# Patient Record
Sex: Female | Born: 1970 | Race: White | Hispanic: No | Marital: Single | Smoking: Never smoker
Health system: Southern US, Community
[De-identification: ages and names within clinical notes are randomized; demographics above are authoritative.]

## PROBLEM LIST (undated history)

## (undated) DIAGNOSIS — I341 Nonrheumatic mitral (valve) prolapse: Secondary | ICD-10-CM

## (undated) DIAGNOSIS — F329 Major depressive disorder, single episode, unspecified: Secondary | ICD-10-CM

## (undated) DIAGNOSIS — F32A Depression, unspecified: Secondary | ICD-10-CM

## (undated) HISTORY — DX: Nonrheumatic mitral (valve) prolapse: I34.1

## (undated) HISTORY — DX: Major depressive disorder, single episode, unspecified: F32.9

## (undated) HISTORY — DX: Depression, unspecified: F32.A

---

## 1996-06-11 HISTORY — PX: AUGMENTATION MAMMAPLASTY: SUR837

## 2001-01-28 ENCOUNTER — Encounter: Admission: RE | Admit: 2001-01-28 | Discharge: 2001-01-28 | Payer: Self-pay | Admitting: *Deleted

## 2001-01-28 ENCOUNTER — Encounter: Payer: Self-pay | Admitting: *Deleted

## 2002-04-13 ENCOUNTER — Other Ambulatory Visit: Admission: RE | Admit: 2002-04-13 | Discharge: 2002-04-13 | Payer: Self-pay | Admitting: Family Medicine

## 2007-11-25 ENCOUNTER — Observation Stay (HOSPITAL_COMMUNITY): Admission: AD | Admit: 2007-11-25 | Discharge: 2007-11-25 | Payer: Self-pay | Admitting: Obstetrics and Gynecology

## 2007-11-26 ENCOUNTER — Inpatient Hospital Stay (HOSPITAL_COMMUNITY): Admission: AD | Admit: 2007-11-26 | Discharge: 2007-11-28 | Payer: Self-pay | Admitting: Obstetrics & Gynecology

## 2010-10-24 NOTE — Op Note (Signed)
NAMESAMEKA, [name]                 ACCOUNT NO.:  [id number]   MEDICAL RECORD NO.:  [id number]          PATIENT TYPE:  INP   LOCATION:  9138                          FACILITY:  [facility]   PHYSICIAN:  Gerrit Friends. Aldona Bar, M.D.   DATE OF BIRTH:  1970/11/22   DATE OF PROCEDURE:  11/26/2007  DATE OF DISCHARGE:                               OPERATIVE REPORT   PREOPERATIVE DIAGNOSES:  Term pregnancy, known breech, ruptured  membranes, labor.   POSTOPERATIVE DIAGNOSES:  Term pregnancy, known breech, ruptured  membranes, labor plus delivery of a 7-pound-6-ounce female infant, Apgars  9 and 9 - frank breech presentation.   PROCEDURE:  Primary low-transverse cesarean section.   SURGEON:  Gerrit Friends. Aldona Bar, MD   ANESTHESIA:  Subarachnoid block, Angelica Pou, MD.   HISTORY:  This 40 year old, gravida 2, para 0, presented with ruptured  membranes for approximately 6 hours and early labor with a known breech  presentation.  She was positive for group B strep and received 2 g of  ampicillin IV prior to going to the operating room.  She was taken to  the operating room for primary low-transverse cesarean section for  delivery because of a known frank breech presentation, which was  confirmed on admission.   PROCEDURE:  The patient was taken to the operating where after  satisfactory induction of a spinal anesthetic by Dr. Malen Gauze, she was  prepped and draped in the usual fashion having been placed in supine  position, slightly tilted to the left.  A Foley catheter was placed just  prior to prep.   After the patient was appropriately draped, good anesthetic levels were  documented and procedure was begun.  A Pfannenstiel incision was made  with minimal difficulty and dissected down sharply to and through the  fascia in a low-transverse fashion with hemostasis created in each  layer.  Subfascial space was created inferiorly and superiorly.  Muscles  were separated in midline.  Peritoneum was identified  and entered  appropriate with care taken to avoid the bowel superiorly and the  bladder inferiorly.  At this time, the vesicouterine peritoneum was  identified, incised in a low-transverse fashion, pushed off the lower  uterine segment, and then a sharp incision into the lower segment was  made with Metzenbaum scissors and extended laterally.  From a frank  breech presentation, a viable female infant was delivered without  difficulty.  Infant cried spontaneously once and after cord was clamped  and cut, the infant was passed off the awaiting team.  Apgars were noted  to be 9 and 9 and subsequent weight was found to be 7 pounds 6 ounces.   After the placenta was delivered, the uterus was exteriorized and  rendered free of any remaining products of conception.  Good uterine  contractility was afforded with slowly given intravenous Pitocin and  manual stimulation.  Closure of the uterine incision was then carried  out using a single layer of #1 Vicryl in a running locking fashion.  Several figure-of-eight #1 Vicryls were used thereafter for additional  hemostasis.  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  Tubes and ovaries were  inspected and noted to be normal as well.   At this time with the uterine incision well approximated and dry, the  abdomen was lavaged of all free blood and clot.  The uterus was replaced  into the abdominal cavity.  At this point, all counts were noted to be  correct and no foreign bodies were noted to be remained in the abdominal  cavity.  Closure of the abdomen was then carried out in layers.  The  abdominal peritoneum was closed with 0 Vicryl in a running fashion and  muscle was secured with same.  After assured of good fascial hemostasis,  the fascia was reapproximated using 0 Vicryl from angle to midline  bilaterally.  Subcutaneous tissues were found to be hemostatic and  staples were then used to  close the skin.  Sterile pressure was applied.  At this point, the patient was taken to recovery room in satisfactory  condition, having tolerated the procedure tolerated well.  Estimated  blood loss 500 mL.  All counts were correct x2.  After conclusion of  procedure, both mother and baby were doing well in the respective  recovery areas.      Gerrit Friends. Aldona Bar, M.D.  Electronically Signed     RMW/MEDQ  D:  11/26/2007  T:  11/26/2007  Job:  [id number]

## 2010-10-27 NOTE — Discharge Summary (Signed)
NAMECIARA, Kara [name]                 ACCOUNT NO.:  [id number]   MEDICAL RECORD NO.:  [id number]          PATIENT TYPE:  INP   LOCATION:  9138                          FACILITY:  [facility]   PHYSICIAN:  Ilda Mori, M.D.   DATE OF BIRTH:  1970-07-26   DATE OF ADMISSION:  11/26/2007  DATE OF DISCHARGE:  11/28/2007                               DISCHARGE SUMMARY   FINAL DIAGNOSES:  Intrauterine gestation at term, known breech  presentation, rupture of membranes, active labor.   PROCEDURE:  Primary low-transverse cesarean section.   SURGEON:  Gerrit Friends. Aldona Bar, M.D.   COMPLICATIONS:  None.   This 40 year old G2 P 0-0-1-0 presents at term with rupture of membranes  and in early active labor.  The patient did have a positive group B  strep culture obtained in our office.  She was started on ampicillin IV  prior to going to the operating room.  She had a known frank breech  presentation.  Otherwise, the patient's antepartum course had been  uncomplicated.  The patient was taken to the operating room on November 26, 2007 by Dr. Annamaria Helling, where a primary low-transverse cesarean section  was performed with the delivery of a 7-pound 6-ounce female infant with  Apgars of 9 and 9. The baby was delivered in a frank breech  presentation.  The delivery went without complications.  The patient's  postoperative course was benign without any fevers.  She declined  circumcision of her little boy.  The patient was felt ready for  discharge on postoperative day #2.  We sent home on a regular diet, told  to decrease activities, told to continue her vitamins.  She was given  Percocet 1-2 every 4 hours as needed for pain, told she could use  ibuprofen up to 600 mg every 6 hours as needed for pain, and was to  follow up in our office in 6 weeks.   LABS ON DISCHARGE:  The patient had a hemoglobin of 9.1, white blood  cell count of 10.3, and platelets of 151,000.      Leilani Able, P.A.-C.      Ilda Mori, M.D.  Electronically Signed    MB/MEDQ  D:  12/31/2007  T:  01/01/2008  Job:  [id number]

## 2011-03-08 LAB — CBC
HCT: 26.8 — ABNORMAL LOW
HCT: 34.2 — ABNORMAL LOW
Hemoglobin: 11.8 — ABNORMAL LOW
Hemoglobin: 9.1 — ABNORMAL LOW
MCHC: 34
MCHC: 34.5
MCV: 96.3
MCV: 98.4
Platelets: 151
Platelets: 183
RBC: 2.72 — ABNORMAL LOW
RBC: 3.55 — ABNORMAL LOW
RDW: 13.2
RDW: 13.7
WBC: 10.3
WBC: 12.8 — ABNORMAL HIGH

## 2011-03-08 LAB — RPR: RPR Ser Ql: NONREACTIVE

## 2011-04-09 ENCOUNTER — Ambulatory Visit (INDEPENDENT_AMBULATORY_CARE_PROVIDER_SITE_OTHER): Payer: Self-pay | Admitting: Family Medicine

## 2011-04-09 ENCOUNTER — Encounter: Payer: Self-pay | Admitting: Family Medicine

## 2011-04-09 VITALS — BP 122/80 | HR 68 | Temp 99.6°F | Wt 113.0 lb

## 2011-04-09 DIAGNOSIS — F329 Major depressive disorder, single episode, unspecified: Secondary | ICD-10-CM

## 2011-04-09 MED ORDER — SERTRALINE HCL 50 MG PO TABS: 50.0000 mg | ORAL_TABLET | Freq: Every day | ORAL | Status: DC

## 2011-04-09 NOTE — Patient Instructions (Signed)
Great to meet you. Please let me know if you want [place] to set up your mammogram in the future.   Call me in 3-4 weeks with an update of your symptoms.

## 2011-04-09 NOTE — Progress Notes (Signed)
  Subjective:    Patient ID: Kara [name], female    DOB: 04/28/1971, 40 y.o.   MRN: [id number]  HPI  40 yo new to practice here to discuss depression.  Pt does not have health insurance, would like for this to be acute visit only.  Depression- started Zoloft in 2009.  Felt it helped with her symptoms tremendously.  Had a previously adverse reaction to Wellbutrin- made her more anxious.  Weaned herself off in May because she and her husband were trying to conceive. They have decided not to have more children at this time and she would like to restart an antidepressant. Has felt more anxious and tearful lately. 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  No SI or HI.  Patient Active Problem List  Diagnoses  . Depression   Past Medical History  Diagnosis Date  . Depression   . Mitral valve prolapse    No past surgical history on file. History  Substance Use Topics  . Smoking status: Never Smoker   . Smokeless tobacco: Not on file  . Alcohol Use: Not on file   Family History  Problem Relation Age of Onset  . Cancer Maternal Aunt 55    breast   Allergies  Allergen Reactions  . Sulfa Antibiotics Shortness Of Breath   No current outpatient prescriptions on file prior to visit.   The PMH, PSH, Social History, Family History, Medications, and allergies have been reviewed in [facility], and have been updated if relevant.   Review of Systems See HPI    Objective:   Physical Exam BP 122/80  Pulse 68  Temp(Src) 99.6 F (37.6 C) (Oral)  Wt 113 lb (51.256 kg)  General:  Well-developed,well-nourished,in no acute distress; alert,appropriate and cooperative throughout examination Head:  normocephalic and atraumatic.   Eyes:  vision grossly intact, pupils equal, pupils round, and pupils reactive to light.   Ears:  R ear normal and L ear normal.   Nose:  no external deformity.   Mouth:  good dentition.   Neurologic:  alert & oriented X3  and gait normal.   Skin:  Intact without suspicious lesions or rashes Psych:  Cognition and judgment appear intact. Alert and cooperative with normal attention span and concentration. No apparent delusions, illusions, hallucinations     Assessment & Plan:   1. Depression  sertraline (ZOLOFT) 50 MG tablet   Deteriorated. >35 min spent with face to face with patient, >50% counseling and/or coordinating care. Will restart Zoloft at 50 mg daily (she could not remember dose). She will cal lin 3-4 weeks with an update of her symptoms.

## 2011-05-07 ENCOUNTER — Telehealth: Payer: Self-pay | Admitting: *Deleted

## 2011-05-07 NOTE — Telephone Encounter (Signed)
Patient was told to call you back in 3-4 weeks to let you know how the anti-depressant was working for her. Patient states that the medication is working fine and want to continue on it. Patient states that she has refills and will have pharmacy notify you when she needs more.

## 2011-05-07 NOTE — Telephone Encounter (Signed)
That's great! Thank you.

## 2012-03-27 ENCOUNTER — Other Ambulatory Visit: Payer: Self-pay | Admitting: Family Medicine

## 2012-04-07 ENCOUNTER — Ambulatory Visit (INDEPENDENT_AMBULATORY_CARE_PROVIDER_SITE_OTHER): Payer: Self-pay

## 2012-04-07 DIAGNOSIS — Z23 Encounter for immunization: Secondary | ICD-10-CM

## 2012-05-06 ENCOUNTER — Other Ambulatory Visit: Payer: Self-pay | Admitting: Family Medicine

## 2012-05-30 ENCOUNTER — Encounter: Payer: Self-pay | Admitting: Family Medicine

## 2012-05-30 ENCOUNTER — Ambulatory Visit (INDEPENDENT_AMBULATORY_CARE_PROVIDER_SITE_OTHER): Payer: BC Managed Care – PPO | Admitting: Family Medicine

## 2012-05-30 VITALS — BP 130/82 | HR 80 | Temp 99.0°F | Wt 117.0 lb

## 2012-05-30 DIAGNOSIS — F329 Major depressive disorder, single episode, unspecified: Secondary | ICD-10-CM

## 2012-05-30 DIAGNOSIS — Z1231 Encounter for screening mammogram for malignant neoplasm of breast: Secondary | ICD-10-CM

## 2012-05-30 MED ORDER — SERTRALINE HCL 50 MG PO TABS: ORAL_TABLET | ORAL | Status: DC

## 2012-05-30 NOTE — Patient Instructions (Addendum)
Good to see you. Have a wonderful Christmas!  Stop by to see Shirlee Limerick on your way out to set up your way.

## 2012-05-30 NOTE — Progress Notes (Signed)
  Subjective:    Patient ID: Kara [name], female    DOB: 09-Oct-1970, 41 y.o.   MRN: [id number]  HPI  41 yo here to follow up depression.   Depression- restarted Zoloft 50 mg in 2012.  Felt it helped with her symptoms tremendously.  Had a previously adverse reaction to Wellbutrin- made her more anxious.  Feels great with Zoloft.  Denies any symptoms of anxiety or depression.  Would like it refilled today.   No SI or HI.  Scheduled for CPX in two months.  She would like to go ahead and schedule mammogram.  Patient Active Problem List  Diagnosis  . Depression   Past Medical History  Diagnosis Date  . Depression   . Mitral valve prolapse    No past surgical history on file. History  Substance Use Topics  . Smoking status: Never Smoker   . Smokeless tobacco: Not on file  . Alcohol Use: Not on file   Family History  Problem Relation Age of Onset  . Cancer Maternal Aunt 55    breast   Allergies  Allergen Reactions  . Sulfa Antibiotics Shortness Of Breath   Current Outpatient Prescriptions on File Prior to Visit  Medication Sig Dispense Refill  . cetirizine (ZYRTEC) 10 MG tablet Take by mouth daily as needed       . diphenhydrAMINE (BENADRYL ALLERGY) 25 MG tablet Take by mouth as directed as needed.       . Multiple Vitamin (MULTIVITAMIN) tablet Take 1 tablet by mouth daily.        . sertraline (ZOLOFT) 50 MG tablet TAKE ONE TABLET BY MOUTH ONE TIME DAILY  30 tablet  0   The PMH, PSH, Social History, Family History, Medications, and allergies have been reviewed in [facility], and have been updated if relevant.   Review of Systems See HPI    Objective:   Physical Exam There were no vitals taken for this visit.  General:  Well-developed,well-nourished,in no acute distress; alert,appropriate and cooperative throughout examination Head:  normocephalic and atraumatic.   Eyes:  vision grossly intact, pupils equal, pupils round, and pupils reactive to light.   Ears:  R ear  normal and L ear normal.   Nose:  no external deformity.   Mouth:  good dentition.   Neurologic:  alert & oriented X3 and gait normal.   Skin:  Intact without suspicious lesions or rashes Psych:  Cognition and judgment appear intact. Alert and cooperative with normal attention span and concentration. No apparent delusions, illusions, hallucinations     Assessment & Plan:   1. Depression    >15 min spent with face to face with patient, >50% counseling and/or coordinating care. Stable on current dose of Zoloft. Rx refilled.

## 2012-06-30 ENCOUNTER — Ambulatory Visit
Admission: RE | Admit: 2012-06-30 | Discharge: 2012-06-30 | Disposition: A | Discharge status: Home / Self Care | Payer: BC Managed Care – PPO | Source: Ambulatory Visit | Attending: Family Medicine | Admitting: Family Medicine

## 2012-06-30 ENCOUNTER — Other Ambulatory Visit: Payer: Self-pay | Admitting: Family Medicine

## 2012-06-30 DIAGNOSIS — Z1231 Encounter for screening mammogram for malignant neoplasm of breast: Secondary | ICD-10-CM

## 2012-07-02 ENCOUNTER — Other Ambulatory Visit: Payer: Self-pay | Admitting: Family Medicine

## 2012-07-02 DIAGNOSIS — Z0000 Encounter for general adult medical examination without abnormal findings: Secondary | ICD-10-CM

## 2012-07-02 DIAGNOSIS — Z136 Encounter for screening for cardiovascular disorders: Secondary | ICD-10-CM

## 2012-07-07 ENCOUNTER — Telehealth: Payer: Self-pay

## 2012-07-07 NOTE — Telephone Encounter (Signed)
Pt has CPX on 07/15/12 and lab appt 07/08/12; pt is not sure if will be on menstrual period when comes for CPX and wants to know if she should cancel appts. Advised to keep appts and if mild flow may be able to do pap or reschedule for brief appt if bleeding heavy. Pt will keep appts.

## 2012-07-08 ENCOUNTER — Other Ambulatory Visit (INDEPENDENT_AMBULATORY_CARE_PROVIDER_SITE_OTHER): Payer: BC Managed Care – PPO

## 2012-07-08 DIAGNOSIS — Z136 Encounter for screening for cardiovascular disorders: Secondary | ICD-10-CM

## 2012-07-08 DIAGNOSIS — Z0000 Encounter for general adult medical examination without abnormal findings: Secondary | ICD-10-CM

## 2012-07-08 LAB — CBC WITH DIFFERENTIAL/PLATELET
Basophils Absolute: 0 10*3/uL (ref 0.0–0.1)
Basophils Relative: 0.4 % (ref 0.0–3.0)
Eosinophils Absolute: 0.2 10*3/uL (ref 0.0–0.7)
Hemoglobin: 13.3 g/dL (ref 12.0–15.0)
Lymphocytes Relative: 29.2 % (ref 12.0–46.0)
MCHC: 33.9 g/dL (ref 30.0–36.0)
Monocytes Relative: 3.6 % (ref 3.0–12.0)
Neutro Abs: 4.9 10*3/uL (ref 1.4–7.7)
Neutrophils Relative %: 63.6 % (ref 43.0–77.0)
RBC: 4.31 Mil/uL (ref 3.87–5.11)
WBC: 7.7 10*3/uL (ref 4.5–10.5)

## 2012-07-08 LAB — COMPREHENSIVE METABOLIC PANEL
ALT: 14 U/L (ref 0–35)
AST: 18 U/L (ref 0–37)
Albumin: 4.5 g/dL (ref 3.5–5.2)
Alkaline Phosphatase: 43 U/L (ref 39–117)
Glucose, Bld: 103 mg/dL — ABNORMAL HIGH (ref 70–99)
Potassium: 4.1 mEq/L (ref 3.5–5.1)
Sodium: 138 mEq/L (ref 135–145)
Total Bilirubin: 1 mg/dL (ref 0.3–1.2)
Total Protein: 7.6 g/dL (ref 6.0–8.3)

## 2012-07-08 LAB — LIPID PANEL
Cholesterol: 180 mg/dL (ref 0–200)
LDL Cholesterol: 118 mg/dL — ABNORMAL HIGH (ref 0–99)
Total CHOL/HDL Ratio: 3
VLDL: 9.6 mg/dL (ref 0.0–40.0)

## 2012-07-15 ENCOUNTER — Ambulatory Visit (INDEPENDENT_AMBULATORY_CARE_PROVIDER_SITE_OTHER): Payer: BC Managed Care – PPO | Admitting: Family Medicine

## 2012-07-15 ENCOUNTER — Encounter: Payer: Self-pay | Admitting: Family Medicine

## 2012-07-15 ENCOUNTER — Other Ambulatory Visit (HOSPITAL_COMMUNITY)
Admission: RE | Admit: 2012-07-15 | Discharge: 2012-07-15 | Disposition: A | Discharge status: Home / Self Care | Payer: BC Managed Care – PPO | Source: Ambulatory Visit | Attending: Family Medicine | Admitting: Family Medicine

## 2012-07-15 VITALS — BP 110/64 | HR 76 | Temp 98.7°F | Ht 63.0 in | Wt 114.0 lb

## 2012-07-15 DIAGNOSIS — F329 Major depressive disorder, single episode, unspecified: Secondary | ICD-10-CM

## 2012-07-15 DIAGNOSIS — Z01419 Encounter for gynecological examination (general) (routine) without abnormal findings: Secondary | ICD-10-CM | POA: Insufficient documentation

## 2012-07-15 DIAGNOSIS — Z1151 Encounter for screening for human papillomavirus (HPV): Secondary | ICD-10-CM | POA: Insufficient documentation

## 2012-07-15 DIAGNOSIS — Z23 Encounter for immunization: Secondary | ICD-10-CM

## 2012-07-15 DIAGNOSIS — Z0000 Encounter for general adult medical examination without abnormal findings: Secondary | ICD-10-CM | POA: Insufficient documentation

## 2012-07-15 NOTE — Addendum Note (Signed)
Addended by: Eliezer Bottom on: 07/15/2012 09:48 AM   Modules accepted: Orders

## 2012-07-15 NOTE — Patient Instructions (Addendum)
It was so wonderful to see you. Please take a prenatal vitamin daily.  Call me in a few months if you want me to refer you to a reproductive endocrinologist.

## 2012-07-15 NOTE — Progress Notes (Signed)
Subjective:    Patient ID: Kara [name], female    DOB: 1971/01/30, 42 y.o.   MRN: [id number]  HPI  42 yo very pleasant G1P1 here for CPX.   Depression- restarted Zoloft 50 mg in 2012.  Felt it helped with her symptoms tremendously.  Had a previously adverse reaction to Wellbutrin- made her more anxious.  Feels great with Zoloft.  Denies any symptoms of anxiety or depression.    No SI or HI.  Normal mammogram last month.  Due for Pap smear.  No h/o abnormal pap smears.  Sexually active with husband only.  They are considering having another child.  Lab Results  Component Value Date   CHOL 180 07/08/2012   HDL 52.80 07/08/2012   LDLCALC 118* 07/08/2012   TRIG 48.0 07/08/2012   CHOLHDL 3 07/08/2012     Patient Active Problem List  Diagnosis  . Depression  . Routine general medical examination at a health care facility   Past Medical History  Diagnosis Date  . Depression   . Mitral valve prolapse    No past surgical history on file. History  Substance Use Topics  . Smoking status: Never Smoker   . Smokeless tobacco: Not on file  . Alcohol Use: Not on file   Family History  Problem Relation Age of Onset  . Cancer Maternal Aunt 55    breast   Allergies  Allergen Reactions  . Sulfa Antibiotics Shortness Of Breath   Current Outpatient Prescriptions on File Prior to Visit  Medication Sig Dispense Refill  . cetirizine (ZYRTEC) 10 MG tablet Take by mouth daily as needed       . diphenhydrAMINE (BENADRYL ALLERGY) 25 MG tablet Take by mouth as directed as needed.       . Multiple Vitamin (MULTIVITAMIN) tablet Take 1 tablet by mouth daily.        . sertraline (ZOLOFT) 50 MG tablet TAKE ONE TABLET BY MOUTH ONE TIME DAILY  90 tablet  6   The PMH, PSH, Social History, Family History, Medications, and allergies have been reviewed in [facility], and have been updated if relevant.   Review of Systems See HPI Patient reports no  vision/ hearing changes,anorexia, weight change,  fever ,adenopathy, persistant / recurrent hoarseness, swallowing issues, chest pain, edema,persistant / recurrent cough, hemoptysis, dyspnea(rest, exertional, paroxysmal nocturnal), gastrointestinal  bleeding (melena, rectal bleeding), abdominal pain, excessive heart burn, GU symptoms(dysuria, hematuria, pyuria, voiding/incontinence  Issues) syncope, focal weakness, severe memory loss, concerning skin lesions, depression, anxiety, abnormal bruising/bleeding, major joint swelling, breast masses or abnormal vaginal bleeding.       Objective:   Physical Exam BP 110/64  Pulse 76  Temp 98.7 F (37.1 C)  Ht 5\' 3"  (1.6 m)  Wt 114 lb (51.71 kg)  BMI 20.19 kg/m2   General:  Well-developed,well-nourished,in no acute distress; alert,appropriate and cooperative throughout examination Head:  normocephalic and atraumatic.   Eyes:  vision grossly intact, pupils equal, pupils round, and pupils reactive to light.   Ears:  R ear normal and L ear normal.   Nose:  no external deformity.   Mouth:  good dentition.   Neck:  No deformities, masses, or tenderness noted. Breasts:  +implants bilaterally, No mass, nodules, thickening, tenderness, bulging, retraction, inflamation, nipple discharge or skin changes noted.   Lungs:  Normal respiratory effort, chest expands symmetrically. Lungs are clear to auscultation, no crackles or wheezes. Heart:  Normal rate and regular rhythm. S1 and S2 normal without gallop, murmur, click, rub  or other extra sounds. Abdomen:  Bowel sounds positive,abdomen soft and non-tender without masses, organomegaly or hernias noted. Rectal:  no external abnormalities.   Genitalia:  Pelvic Exam:        External: normal female genitalia without lesions or masses        Vagina: normal without lesions or masses        Cervix: normal without lesions or masses        Adnexa: normal bimanual exam without masses or fullness        Uterus: normal by palpation        Pap smear: performed Msk:  No  deformity or scoliosis noted of thoracic or lumbar spine.   Extremities:  No clubbing, cyanosis, edema, or deformity noted with normal full range of motion of all joints.   Neurologic:  alert & oriented X3 and gait normal.   Skin:  Intact without suspicious lesions or rashes Cervical Nodes:  No lymphadenopathy noted Axillary Nodes:  No palpable lymphadenopathy Psych:  Cognition and judgment appear intact. Alert and cooperative with normal attention span and concentration. No apparent delusions, illusions, hallucinations      Assessment & Plan:   1. Routine general medical examination at a health care facility  Reviewed preventive care protocols, scheduled due services, and updated immunizations Discussed nutrition, exercise, diet, and healthy lifestyle.  Pap smear  2. Depression  Stable on current dose of Zoloft.

## 2012-07-18 ENCOUNTER — Encounter: Payer: Self-pay | Admitting: *Deleted

## 2012-07-18 ENCOUNTER — Encounter: Payer: Self-pay | Admitting: Family Medicine

## 2012-10-02 ENCOUNTER — Telehealth: Payer: Self-pay

## 2012-10-02 NOTE — Telephone Encounter (Signed)
Pt left v/m;at last office visit pt had discussed with Dr Dayton Martes about possible fertility clinic; pt wants to know the name of the fertility clinic and the name of the med that would strengthen pt's eggs.Please advise.

## 2012-10-02 NOTE — Telephone Encounter (Signed)
Advised patient

## 2012-10-02 NOTE — Telephone Encounter (Signed)
Dr. April Manson is who I saw and he was great.  He is the head of infertility at [facility] but has a satellite office in [place] (on [address]). His phone number is [phone]  There are a few medications that women take for infertility or to increase ovarian follicles (eggs).  The two most common are Clomid and Femara.

## 2013-04-01 ENCOUNTER — Telehealth: Payer: Self-pay

## 2013-04-01 ENCOUNTER — Other Ambulatory Visit: Payer: Self-pay | Admitting: Internal Medicine

## 2013-04-01 DIAGNOSIS — N979 Female infertility, unspecified: Secondary | ICD-10-CM

## 2013-04-01 NOTE — Telephone Encounter (Signed)
Pt called back to get info from 09/2012 about name of infertility meds and doctor. Pt notified as instructed from 10/02/12 note. Pt voiced understanding and will cb if needs anything else.

## 2013-04-01 NOTE — Telephone Encounter (Signed)
The patient called and is hoping to get referral to a fertility clinic.  Her callback - [phone]

## 2013-04-01 NOTE — Telephone Encounter (Signed)
Referral done

## 2013-04-01 NOTE — Telephone Encounter (Signed)
Per Dr. Elmer Sow last note-patient needs referral to reproductive endocrinologist.

## 2013-06-13 ENCOUNTER — Other Ambulatory Visit: Payer: Self-pay | Admitting: Family Medicine

## 2013-06-29 ENCOUNTER — Other Ambulatory Visit: Payer: Self-pay | Admitting: Family Medicine

## 2013-06-29 DIAGNOSIS — Z0000 Encounter for general adult medical examination without abnormal findings: Secondary | ICD-10-CM

## 2013-06-29 DIAGNOSIS — Z136 Encounter for screening for cardiovascular disorders: Secondary | ICD-10-CM

## 2013-07-09 ENCOUNTER — Other Ambulatory Visit: Payer: Self-pay

## 2013-07-09 ENCOUNTER — Other Ambulatory Visit: Payer: BC Managed Care – PPO

## 2013-07-09 DIAGNOSIS — Z1231 Encounter for screening mammogram for malignant neoplasm of breast: Secondary | ICD-10-CM

## 2013-07-10 ENCOUNTER — Other Ambulatory Visit (INDEPENDENT_AMBULATORY_CARE_PROVIDER_SITE_OTHER): Payer: BC Managed Care – PPO

## 2013-07-10 DIAGNOSIS — Z0000 Encounter for general adult medical examination without abnormal findings: Secondary | ICD-10-CM

## 2013-07-10 DIAGNOSIS — Z136 Encounter for screening for cardiovascular disorders: Secondary | ICD-10-CM

## 2013-07-10 LAB — COMPREHENSIVE METABOLIC PANEL
ALK PHOS: 34 U/L — AB (ref 39–117)
ALT: 13 U/L (ref 0–35)
AST: 17 U/L (ref 0–37)
Albumin: 4.4 g/dL (ref 3.5–5.2)
BUN: 13 mg/dL (ref 6–23)
CO2: 25 mEq/L (ref 19–32)
CREATININE: 0.8 mg/dL (ref 0.4–1.2)
Calcium: 9.1 mg/dL (ref 8.4–10.5)
Chloride: 105 mEq/L (ref 96–112)
GFR: 87.31 mL/min (ref >60.00)
GLUCOSE: 93 mg/dL (ref 70–99)
Potassium: 4.2 mEq/L (ref 3.5–5.1)
Sodium: 136 mEq/L (ref 135–145)
Total Bilirubin: 0.9 mg/dL (ref 0.3–1.2)
Total Protein: 7.4 g/dL (ref 6.0–8.3)

## 2013-07-10 LAB — CBC WITH DIFFERENTIAL/PLATELET
BASOS ABS: 0 10*3/uL (ref 0.0–0.1)
Basophils Relative: 0.5 % (ref 0.0–3.0)
Eosinophils Absolute: 0.2 10*3/uL (ref 0.0–0.7)
Eosinophils Relative: 2.9 % (ref 0.0–5.0)
HEMATOCRIT: 40 % (ref 36.0–46.0)
Hemoglobin: 13.1 g/dL (ref 12.0–15.0)
Lymphocytes Relative: 30 % (ref 12.0–46.0)
Lymphs Abs: 2.1 10*3/uL (ref 0.7–4.0)
MCHC: 32.9 g/dL (ref 30.0–36.0)
MCV: 92.8 fl (ref 78.0–100.0)
MONO ABS: 0.3 10*3/uL (ref 0.1–1.0)
MONOS PCT: 3.9 % (ref 3.0–12.0)
NEUTROS PCT: 62.7 % (ref 43.0–77.0)
Neutro Abs: 4.5 10*3/uL (ref 1.4–7.7)
PLATELETS: 293 10*3/uL (ref 150.0–400.0)
RBC: 4.31 Mil/uL (ref 3.87–5.11)
RDW: 13.1 % (ref 11.5–14.6)
WBC: 7.1 10*3/uL (ref 4.5–10.5)

## 2013-07-10 LAB — LIPID PANEL
CHOL/HDL RATIO: 3
Cholesterol: 207 mg/dL — ABNORMAL HIGH (ref 0–200)
HDL: 62.2 mg/dL (ref >39.00)
TRIGLYCERIDES: 53 mg/dL (ref 0.0–149.0)
VLDL: 10.6 mg/dL (ref 0.0–40.0)

## 2013-07-10 LAB — TSH: TSH: 1.45 u[IU]/mL (ref 0.35–5.50)

## 2013-07-10 LAB — LDL CHOLESTEROL, DIRECT: Direct LDL: 141.4 mg/dL

## 2013-07-16 ENCOUNTER — Encounter: Payer: BC Managed Care – PPO | Admitting: Family Medicine

## 2013-07-20 ENCOUNTER — Ambulatory Visit (INDEPENDENT_AMBULATORY_CARE_PROVIDER_SITE_OTHER): Payer: BC Managed Care – PPO | Admitting: Family Medicine

## 2013-07-20 ENCOUNTER — Encounter: Payer: Self-pay | Admitting: Family Medicine

## 2013-07-20 VITALS — BP 110/70 | HR 82 | Temp 98.0°F | Ht 62.5 in | Wt 114.8 lb

## 2013-07-20 DIAGNOSIS — F329 Major depressive disorder, single episode, unspecified: Secondary | ICD-10-CM

## 2013-07-20 DIAGNOSIS — Z0000 Encounter for general adult medical examination without abnormal findings: Secondary | ICD-10-CM

## 2013-07-20 DIAGNOSIS — F3289 Other specified depressive episodes: Secondary | ICD-10-CM

## 2013-07-20 DIAGNOSIS — F32A Depression, unspecified: Secondary | ICD-10-CM

## 2013-07-20 MED ORDER — SERTRALINE HCL 50 MG PO TABS
ORAL_TABLET | ORAL | Status: DC
Start: 2013-07-20 — End: 2014-08-19

## 2013-07-20 MED ORDER — SERTRALINE HCL 50 MG PO TABS: ORAL_TABLET | ORAL | Status: DC

## 2013-07-20 NOTE — Progress Notes (Signed)
Pre-visit discussion using our clinic review tool. No additional management support is needed unless otherwise documented below in the visit note.  

## 2013-07-20 NOTE — Assessment & Plan Note (Signed)
Reviewed preventive care protocols, scheduled due services, and updated immunizations Discussed nutrition, exercise, diet, and healthy lifestyle.  Discussed USPSTF recommendations of cervical cancer screening.  She is aware that interval of 3 years is recommended.

## 2013-07-20 NOTE — Progress Notes (Signed)
Subjective:    Patient ID: Kara [name], female    DOB: 10/25/1970, 43 y.o.   MRN: [id number]  HPI  43 yo very pleasant G1P1 here for CPX.   Depression- restarted Zoloft 50 mg in 2012.  Felt it helped with her symptoms tremendously.  Had a previously adverse reaction to Wellbutrin- made her more anxious.  Feels great with Zoloft.  Denies any symptoms of anxiety or depression.    No SI or HI.   No h/o abnormal pap smears.  Sexually active with husband only.  They are considering having another child. Last pap smear 07/17/2012- done by me.  Lab Results  Component Value Date   CHOL 207* 07/10/2013   HDL 62.20 07/10/2013   LDLCALC 118* 07/08/2012   LDLDIRECT 141.4 07/10/2013   TRIG 53.0 07/10/2013   CHOLHDL 3 07/10/2013     Patient Active Problem List   Diagnosis Date Noted  . Routine general medical examination at a health care facility 07/15/2012  . Depression 04/09/2011   Past Medical History  Diagnosis Date  . Depression   . Mitral valve prolapse    No past surgical history on file. History  Substance Use Topics  . Smoking status: Never Smoker   . Smokeless tobacco: Not on file  . Alcohol Use: Not on file   Family History  Problem Relation Age of Onset  . Cancer Maternal Aunt 55    breast   Allergies  Allergen Reactions  . Sulfa Antibiotics Shortness Of Breath   Current Outpatient Prescriptions on File Prior to Visit  Medication Sig Dispense Refill  . cetirizine (ZYRTEC) 10 MG tablet Take by mouth daily as needed       . diphenhydrAMINE (BENADRYL ALLERGY) 25 MG tablet Take by mouth as directed as needed.       . Multiple Vitamin (MULTIVITAMIN) tablet Take 1 tablet by mouth daily.         No current facility-administered medications on file prior to visit.   The PMH, PSH, Social History, Family History, Medications, and allergies have been reviewed in [facility], and have been updated if relevant.   Review of Systems See HPI Patient reports no  vision/ hearing  changes,anorexia, weight change, fever ,adenopathy, persistant / recurrent hoarseness, swallowing issues, chest pain, edema,persistant / recurrent cough, hemoptysis, dyspnea(rest, exertional, paroxysmal nocturnal), gastrointestinal  bleeding (melena, rectal bleeding), abdominal pain, excessive heart burn, GU symptoms(dysuria, hematuria, pyuria, voiding/incontinence  Issues) syncope, focal weakness, severe memory loss, concerning skin lesions, depression, anxiety, abnormal bruising/bleeding, major joint swelling, breast masses or abnormal vaginal bleeding.       Objective:   Physical Exam BP 110/70  Pulse 82  Temp(Src) 98 F (36.7 C) (Oral)  Ht 5' 2.5" (1.588 m)  Wt 114 lb 12 oz (52.05 kg)  BMI 20.64 kg/m2  SpO2 98%  LMP 06/26/2013   General:  Well-developed,well-nourished,in no acute distress; alert,appropriate and cooperative throughout examination Head:  normocephalic and atraumatic.   Eyes:  vision grossly intact, pupils equal, pupils round, and pupils reactive to light.   Ears:  R ear normal and L ear normal.   Nose:  no external deformity.   Mouth:  good dentition.   Neck:  No deformities, masses, or tenderness noted. Breasts:  +implants bilaterally, No mass, nodules, thickening, tenderness, bulging, retraction, inflamation, nipple discharge or skin changes noted.   Lungs:  Normal respiratory effort, chest expands symmetrically. Lungs are clear to auscultation, no crackles or wheezes. Heart:  Normal rate and regular rhythm. S1  and S2 normal without gallop, murmur, click, rub or other extra sounds. Abdomen:  Bowel sounds positive,abdomen soft and non-tender without masses, organomegaly or hernias noted. Msk:  No deformity or scoliosis noted of thoracic or lumbar spine.   Extremities:  No clubbing, cyanosis, edema, or deformity noted with normal full range of motion of all joints.   Neurologic:  alert & oriented X3 and gait normal.   Skin:  Intact without suspicious lesions or  rashes Cervical Nodes:  No lymphadenopathy noted Axillary Nodes:  No palpable lymphadenopathy Psych:  Cognition and judgment appear intact. Alert and cooperative with normal attention span and concentration. No apparent delusions, illusions, hallucinations      Assessment & Plan:

## 2013-07-20 NOTE — Assessment & Plan Note (Signed)
Stable on current dose of Zoloft. Rx refilled.

## 2013-08-17 ENCOUNTER — Ambulatory Visit
Admission: RE | Admit: 2013-08-17 | Discharge: 2013-08-17 | Disposition: A | Discharge status: Home / Self Care | Payer: BC Managed Care – PPO | Source: Ambulatory Visit

## 2013-08-17 DIAGNOSIS — Z1231 Encounter for screening mammogram for malignant neoplasm of breast: Secondary | ICD-10-CM

## 2014-07-21 ENCOUNTER — Other Ambulatory Visit: Payer: Self-pay | Admitting: Family Medicine

## 2014-07-21 DIAGNOSIS — Z0000 Encounter for general adult medical examination without abnormal findings: Secondary | ICD-10-CM

## 2014-07-27 ENCOUNTER — Other Ambulatory Visit (INDEPENDENT_AMBULATORY_CARE_PROVIDER_SITE_OTHER): Payer: No Typology Code available for payment source

## 2014-07-27 DIAGNOSIS — Z0000 Encounter for general adult medical examination without abnormal findings: Secondary | ICD-10-CM

## 2014-07-27 LAB — LIPID PANEL
CHOL/HDL RATIO: 3
Cholesterol: 203 mg/dL — ABNORMAL HIGH (ref 0–200)
HDL: 63.8 mg/dL (ref >39.00)
LDL Cholesterol: 130 mg/dL — ABNORMAL HIGH (ref 0–99)
NonHDL: 139.2
Triglycerides: 48 mg/dL (ref 0.0–149.0)
VLDL: 9.6 mg/dL (ref 0.0–40.0)

## 2014-07-27 LAB — COMPREHENSIVE METABOLIC PANEL
ALBUMIN: 4.4 g/dL (ref 3.5–5.2)
ALT: 10 U/L (ref 0–35)
AST: 14 U/L (ref 0–37)
Alkaline Phosphatase: 34 U/L — ABNORMAL LOW (ref 39–117)
BUN: 14 mg/dL (ref 6–23)
CO2: 29 mEq/L (ref 19–32)
CREATININE: 0.8 mg/dL (ref 0.40–1.20)
Calcium: 9.5 mg/dL (ref 8.4–10.5)
Chloride: 104 mEq/L (ref 96–112)
GFR: 83.12 mL/min (ref >60.00)
GLUCOSE: 99 mg/dL (ref 70–99)
POTASSIUM: 4 meq/L (ref 3.5–5.1)
Sodium: 138 mEq/L (ref 135–145)
Total Bilirubin: 0.5 mg/dL (ref 0.2–1.2)
Total Protein: 7.2 g/dL (ref 6.0–8.3)

## 2014-07-27 LAB — CBC WITH DIFFERENTIAL/PLATELET
BASOS ABS: 0 10*3/uL (ref 0.0–0.1)
BASOS PCT: 0.5 % (ref 0.0–3.0)
EOS ABS: 0.1 10*3/uL (ref 0.0–0.7)
Eosinophils Relative: 2 % (ref 0.0–5.0)
HCT: 39.5 % (ref 36.0–46.0)
Hemoglobin: 13.3 g/dL (ref 12.0–15.0)
Lymphocytes Relative: 21.8 % (ref 12.0–46.0)
Lymphs Abs: 1.3 10*3/uL (ref 0.7–4.0)
MCHC: 33.6 g/dL (ref 30.0–36.0)
MCV: 91 fl (ref 78.0–100.0)
MONO ABS: 0.2 10*3/uL (ref 0.1–1.0)
Monocytes Relative: 4.2 % (ref 3.0–12.0)
NEUTROS PCT: 71.5 % (ref 43.0–77.0)
Neutro Abs: 4.2 10*3/uL (ref 1.4–7.7)
PLATELETS: 316 10*3/uL (ref 150.0–400.0)
RBC: 4.34 Mil/uL (ref 3.87–5.11)
RDW: 12.6 % (ref 11.5–15.5)
WBC: 5.9 10*3/uL (ref 4.0–10.5)

## 2014-07-27 LAB — TSH: TSH: 1.57 u[IU]/mL (ref 0.35–4.50)

## 2014-08-03 ENCOUNTER — Ambulatory Visit (INDEPENDENT_AMBULATORY_CARE_PROVIDER_SITE_OTHER): Payer: No Typology Code available for payment source | Admitting: Family Medicine

## 2014-08-03 ENCOUNTER — Encounter: Payer: Self-pay | Admitting: Family Medicine

## 2014-08-03 VITALS — BP 100/70 | HR 90 | Temp 98.4°F | Ht 62.75 in | Wt 107.5 lb

## 2014-08-03 DIAGNOSIS — Z1283 Encounter for screening for malignant neoplasm of skin: Secondary | ICD-10-CM

## 2014-08-03 DIAGNOSIS — Z1239 Encounter for other screening for malignant neoplasm of breast: Secondary | ICD-10-CM

## 2014-08-03 DIAGNOSIS — Z0000 Encounter for general adult medical examination without abnormal findings: Secondary | ICD-10-CM

## 2014-08-03 DIAGNOSIS — F329 Major depressive disorder, single episode, unspecified: Secondary | ICD-10-CM

## 2014-08-03 DIAGNOSIS — F32A Depression, unspecified: Secondary | ICD-10-CM

## 2014-08-03 DIAGNOSIS — Z23 Encounter for immunization: Secondary | ICD-10-CM

## 2014-08-03 NOTE — Progress Notes (Signed)
Pre visit review using our clinic review tool, if applicable. No additional management support is needed unless otherwise documented below in the visit note. 

## 2014-08-03 NOTE — Patient Instructions (Addendum)
Great to see you. Please call to schedule your mammogram after 08/20/14.  Please take two of your zoloft 50 mg tablets daily.  Call me in a few weeks an update.

## 2014-08-03 NOTE — Addendum Note (Signed)
Addended by: Dianne DunARON, Brison Fiumara M on: 08/03/2014 09:07 AM   Modules accepted: Kipp BroodSmartSet

## 2014-08-03 NOTE — Progress Notes (Signed)
Subjective:    Patient ID: Kara [name], female    DOB: 02/14/1971, 44 y.o.   MRN: [id number]  HPI  44 yo very pleasant G1P1 here for CPX.   Depression- deteriorated.  restarted Zoloft 50 mg in 2012.  Felt it helped with her symptoms tremendously.  Had a previously adverse reaction to Wellbutrin- made her more anxious.  Started working again and really enjoys what she is doing but has become more anxious and tearful.  Misses her son, has had to deal with some "personalities at work."   No SI or HI. Appetite decreased.  Sleeping ok. Wt Readings from Last 3 Encounters:  08/03/14 107 lb 8 oz (48.762 kg)  07/20/13 114 lb 12 oz (52.05 kg)  07/15/12 114 lb (51.71 kg)    Tdap 07/15/12   No h/o abnormal pap smears.  Sexually active with husband only.  They are considering having another child. Last pap smear 07/17/2012- done by me.    Lab Results  Component Value Date   CHOL 203* 07/27/2014   HDL 63.80 07/27/2014   LDLCALC 130* 07/27/2014   LDLDIRECT 141.4 07/10/2013   TRIG 48.0 07/27/2014   CHOLHDL 3 07/27/2014    Lab Results  Component Value Date   WBC 5.9 07/27/2014   HGB 13.3 07/27/2014   HCT 39.5 07/27/2014   MCV 91.0 07/27/2014   PLT 316.0 07/27/2014   Lab Results  Component Value Date   TSH 1.57 07/27/2014   Lab Results  Component Value Date   NA 138 07/27/2014   K 4.0 07/27/2014   CL 104 07/27/2014   CO2 29 07/27/2014   Lab Results  Component Value Date   CREATININE 0.80 07/27/2014   Lab Results  Component Value Date   ALT 10 07/27/2014   AST 14 07/27/2014   ALKPHOS 34* 07/27/2014   BILITOT 0.5 07/27/2014     Patient Active Problem List   Diagnosis Date Noted  . Routine general medical examination at a health care facility 07/15/2012  . Depression 04/09/2011   Past Medical History  Diagnosis Date  . Depression   . Mitral valve prolapse    History reviewed. No pertinent past surgical history. History  Substance Use Topics  . Smoking status:  Never Smoker   . Smokeless tobacco: Not on file  . Alcohol Use: Not on file   Family History  Problem Relation Age of Onset  . Cancer Maternal Aunt 55    breast   Allergies  Allergen Reactions  . Sulfa Antibiotics Shortness Of Breath   Current Outpatient Prescriptions on File Prior to Visit  Medication Sig Dispense Refill  . cetirizine (ZYRTEC) 10 MG tablet Take by mouth daily as needed     . diphenhydrAMINE (BENADRYL ALLERGY) 25 MG tablet Take by mouth as directed as needed.     . Multiple Vitamin (MULTIVITAMIN) tablet Take 1 tablet by mouth daily.      . sertraline (ZOLOFT) 50 MG tablet Take 1 tablet by mouth daily. 90 tablet 6   No current facility-administered medications on file prior to visit.   The PMH, PSH, Social History, Family History, Medications, and allergies have been reviewed in [facility], and have been updated if relevant.   Review of Systems See HPI Patient reports no  vision/ hearing changes,anorexia, , fever ,adenopathy, persistant / recurrent hoarseness, swallowing issues, chest pain, edema,persistant / recurrent cough, hemoptysis, dyspnea(rest, exertional, paroxysmal nocturnal), gastrointestinal  bleeding (melena, rectal bleeding), abdominal pain, excessive heart burn, GU symptoms(dysuria, hematuria, pyuria,  voiding/incontinence  Issues) syncope, focal weakness, severe memory loss, concerning skin lesions,  abnormal bruising/bleeding, major joint swelling, breast masses or abnormal vaginal bleeding.       Objective:   Physical Exam BP 100/70 mmHg  Pulse 90  Temp(Src) 98.4 F (36.9 C) (Oral)  Ht 5' 2.75" (1.594 m)  Wt 107 lb 8 oz (48.762 kg)  BMI 19.19 kg/m2  SpO2 98%  LMP 07/19/2014 (Within Days)   General:  Well-developed,well-nourished,in no acute distress; alert,appropriate and cooperative throughout examination Head:  normocephalic and atraumatic.   Eyes:  vision grossly intact, pupils equal, pupils round, and pupils reactive to light.   Ears:  R ear  normal and L ear normal.   Nose:  no external deformity.   Mouth:  good dentition.   Neck:  No deformities, masses, or tenderness noted. Breasts:  +implants bilaterally, No mass, nodules, thickening, tenderness, bulging, retraction, inflamation, nipple discharge or skin changes noted.   Lungs:  Normal respiratory effort, chest expands symmetrically. Lungs are clear to auscultation, no crackles or wheezes. Heart:  Normal rate and regular rhythm. S1 and S2 normal without gallop, murmur, click, rub or other extra sounds. Abdomen:  Bowel sounds positive,abdomen soft and non-tender without masses, organomegaly or hernias noted. Msk:  No deformity or scoliosis noted of thoracic or lumbar spine.   Extremities:  No clubbing, cyanosis, edema, or deformity noted with normal full range of motion of all joints.   Neurologic:  alert & oriented X3 and gait normal.   Skin:  Intact without suspicious lesions or rashes Cervical Nodes:  No lymphadenopathy noted Axillary Nodes:  No palpable lymphadenopathy Psych:  Cognition and judgment appear intact. Alert and cooperative with normal attention span and concentration. No apparent delusions, illusions, hallucinations      Assessment & Plan:

## 2014-08-03 NOTE — Assessment & Plan Note (Signed)
Deteriorated. Discussed tx options. She would like to try a higher dose of zoloft. She will increase her dose at home first- taking two of her 50 mg tablets daily and then call me with an update.  If this works well, will send in rx for zoloft 100 mg daily. The patient indicates understanding of these issues and agrees with the plan.

## 2014-08-03 NOTE — Assessment & Plan Note (Signed)
Reviewed preventive care protocols, scheduled due services, and updated immunizations Discussed nutrition, exercise, diet, and healthy lifestyle.  Flu shot given today.  Form filled out for work (wellness).

## 2014-08-03 NOTE — Addendum Note (Signed)
Addended by: Desmond DikeKNIGHT, Sahian Kerney H on: 08/03/2014 09:14 AM   Modules accepted: Orders

## 2014-08-19 ENCOUNTER — Telehealth: Payer: Self-pay

## 2014-08-19 MED ORDER — SERTRALINE HCL 100 MG PO TABS: 100.0000 mg | ORAL_TABLET | Freq: Every day | ORAL | Status: DC

## 2014-08-19 NOTE — Telephone Encounter (Signed)
Pt was seen 08/03/2014;pt has been taking zoloft 50 mg taking 2 tabs daily and pt reports doing well on this dosage; no more crying and some anxiety but pt has a high stress job. Pt is out of zoloft and request zoloft 100 mg taking one tab daily to [facility]. Please advise.

## 2014-08-19 NOTE — Telephone Encounter (Signed)
eRx sent

## 2014-08-28 ENCOUNTER — Other Ambulatory Visit: Payer: Self-pay | Admitting: Family Medicine

## 2014-09-08 IMAGING — MG MM SCREENING W/IMPLANTS
5 series · 5 of 5 positions shown · non-contrast
Comparison: none

[L CC]
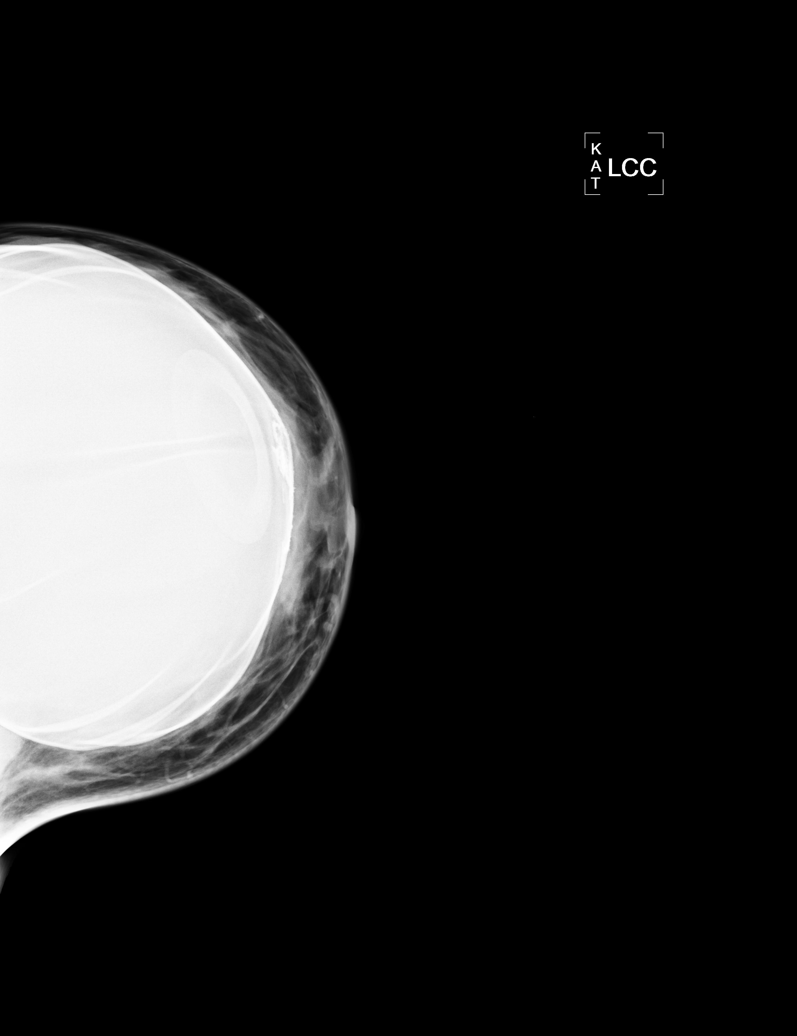

[R MLO]
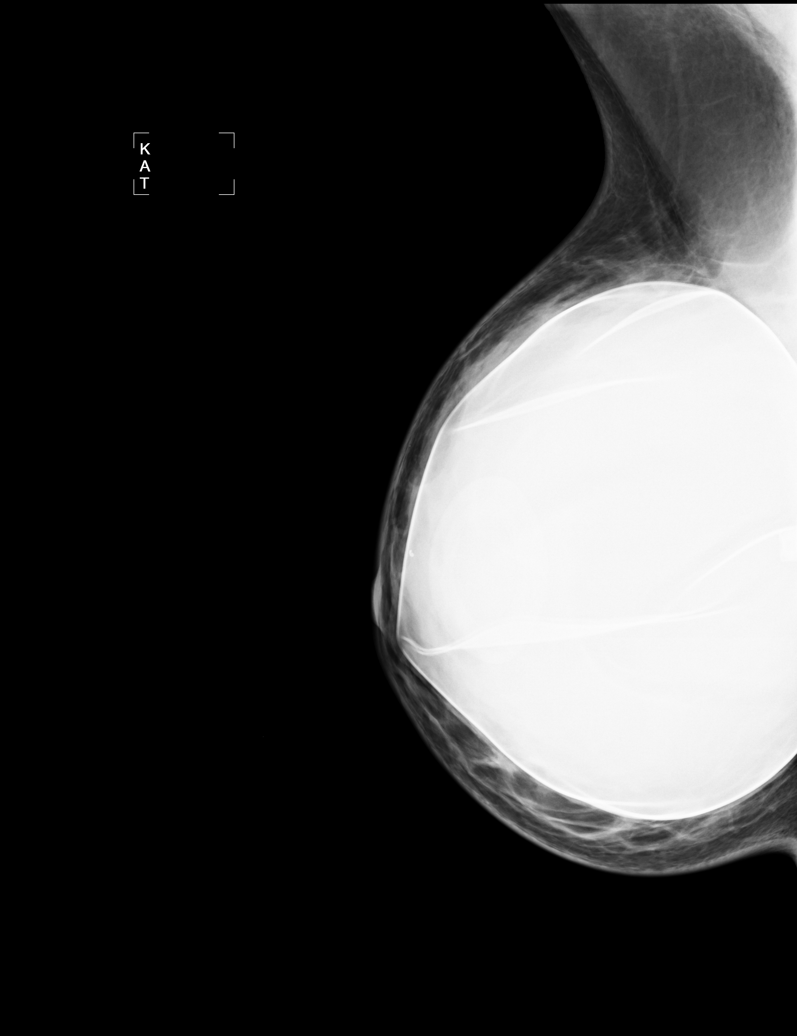

[R CCID]
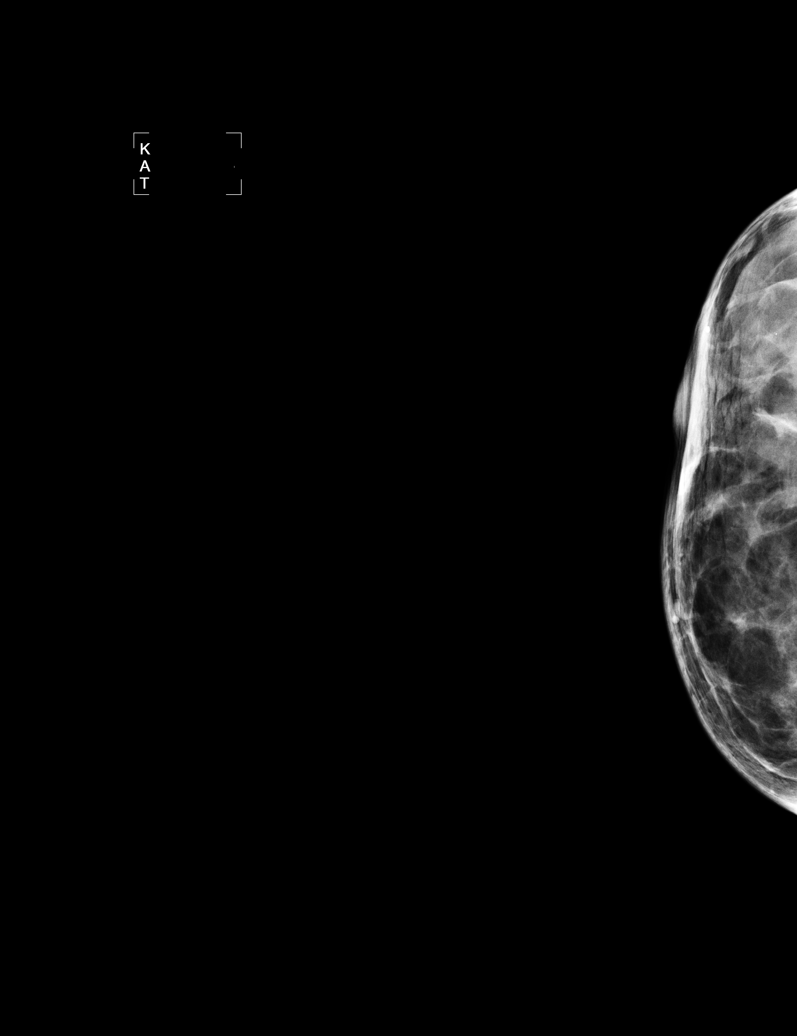

[L CCID]
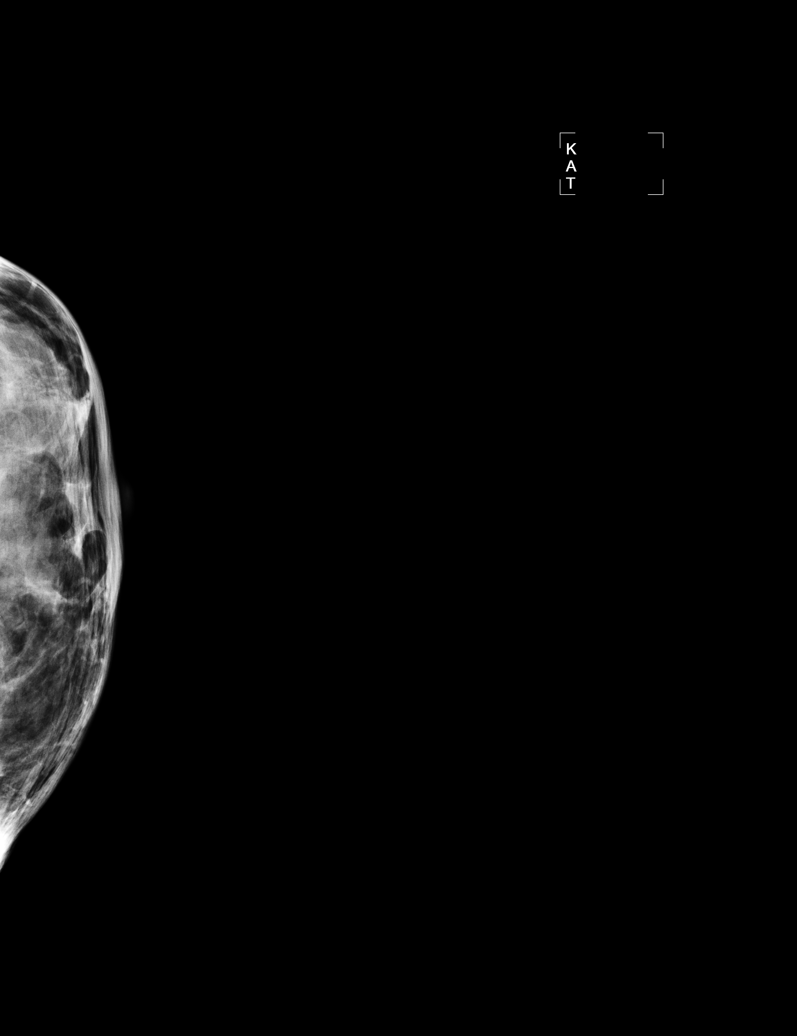

[R MLOID]
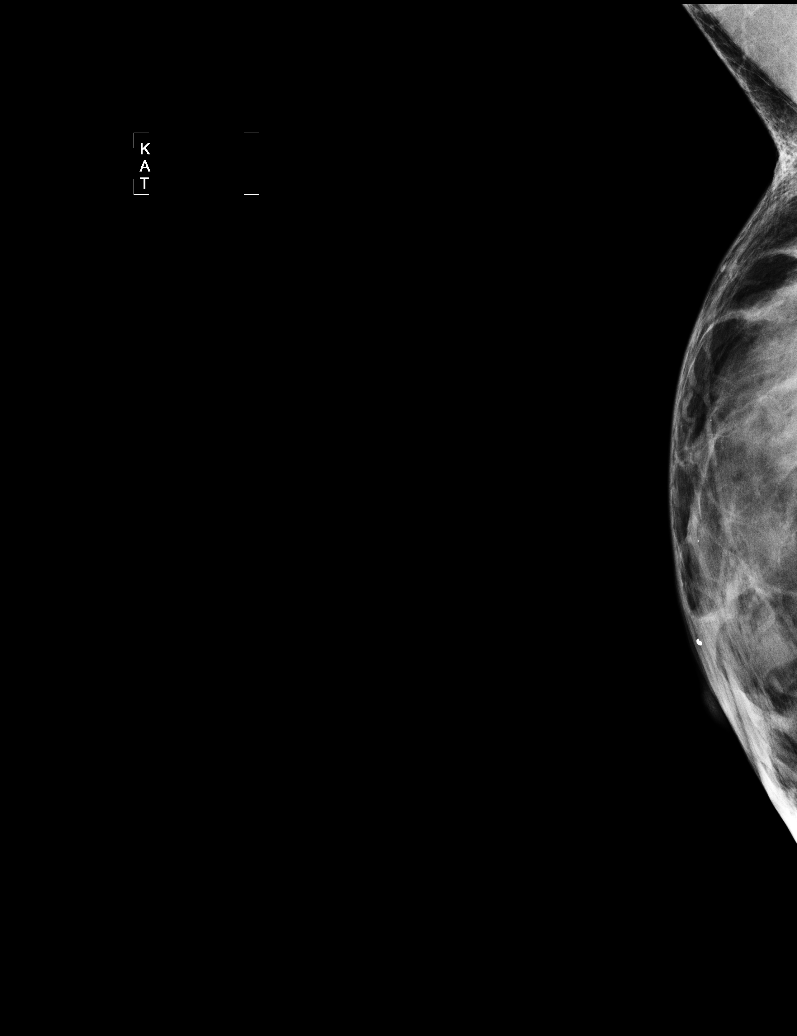

[5 of 5 positions shown; findings below may reference images not displayed]

CLINICAL DATA
Screening.

EXAM
DIGITAL SCREENING BILATERAL MAMMOGRAM WITH IMPLANTS AND CAD

The patient has retroglandular implants. Standard and implant
displaced views were performed.

COMPARISON
Previous exam(s)

ACR BREAST DENSITY
ACR Breast Density Category c: The breast tissue is heterogeneously
dense, which may obscure small masses.

FINDINGS
There are no findings suspicious for malignancy. Images were
processed with CAD.

IMPRESSION
No mammographic evidence of malignancy. A result letter of this
screening mammogram will be mailed directly to the patient.

RECOMMENDATION
Screening mammogram in one year. (Code:[id number]-Z-Q9G)

BI-RADS CATEGORY
1:  Negative.

SIGNATURE

## 2014-10-27 ENCOUNTER — Other Ambulatory Visit: Payer: Self-pay | Admitting: Family Medicine

## 2014-10-27 DIAGNOSIS — Z1231 Encounter for screening mammogram for malignant neoplasm of breast: Secondary | ICD-10-CM

## 2015-08-04 ENCOUNTER — Ambulatory Visit: Payer: No Typology Code available for payment source

## 2015-08-04 ENCOUNTER — Other Ambulatory Visit: Payer: Self-pay | Admitting: Family Medicine

## 2015-08-04 DIAGNOSIS — Z1231 Encounter for screening mammogram for malignant neoplasm of breast: Secondary | ICD-10-CM

## 2015-09-04 ENCOUNTER — Other Ambulatory Visit: Payer: Self-pay | Admitting: Family Medicine

## 2015-10-11 ENCOUNTER — Other Ambulatory Visit: Payer: Self-pay | Admitting: Family Medicine

## 2015-10-11 NOTE — Telephone Encounter (Signed)
Per chart and Rx refill hx, pt does not take daily. Rx sent for #15 and pt advised to contact office and schedule CPE within 15day time frame. Pt has not been seen for f/u or acute since 07/2014, and was advised no additional refills granted until seen in office

## 2015-10-27 ENCOUNTER — Encounter: Payer: No Typology Code available for payment source | Admitting: Family Medicine

## 2015-11-08 ENCOUNTER — Other Ambulatory Visit: Payer: Self-pay | Admitting: Family Medicine

## 2015-11-09 NOTE — Telephone Encounter (Signed)
Pt has upcoming scheduled appt

## 2015-11-28 ENCOUNTER — Other Ambulatory Visit: Payer: Self-pay | Admitting: Family Medicine

## 2015-11-29 LAB — CMP12+LP+TP+TSH+6AC+CBC/D/PLT
A/G RATIO: 2 (ref 1.2–2.2)
ALBUMIN: 4.7 g/dL (ref 3.5–5.5)
ALT: 8 IU/L (ref 0–32)
AST: 14 IU/L (ref 0–40)
Alkaline Phosphatase: 39 IU/L (ref 39–117)
BASOS ABS: 0 10*3/uL (ref 0.0–0.2)
BILIRUBIN TOTAL: 0.4 mg/dL (ref 0.0–1.2)
BUN/Creatinine Ratio: 16 (ref 9–23)
BUN: 12 mg/dL (ref 6–24)
Basos: 0 %
CHOLESTEROL TOTAL: 201 mg/dL — AB (ref 100–199)
Calcium: 9.2 mg/dL (ref 8.7–10.2)
Chloride: 103 mmol/L (ref 96–106)
Chol/HDL Ratio: 3 ratio units (ref 0.0–4.4)
Creatinine, Ser: 0.74 mg/dL (ref 0.57–1.00)
EOS (ABSOLUTE): 0.1 10*3/uL (ref 0.0–0.4)
Eos: 2 %
FREE THYROXINE INDEX: 1.4 (ref 1.2–4.9)
GFR calc Af Amer: 114 mL/min/{1.73_m2} (ref >59)
GFR calc non Af Amer: 99 mL/min/{1.73_m2} (ref >59)
GGT: 11 IU/L (ref 0–60)
GLOBULIN, TOTAL: 2.3 g/dL (ref 1.5–4.5)
GLUCOSE: 110 mg/dL — AB (ref 65–99)
HDL: 66 mg/dL (ref >39)
HEMOGLOBIN: 12.2 g/dL (ref 11.1–15.9)
Hematocrit: 39.1 % (ref 34.0–46.6)
IMMATURE GRANULOCYTES: 0 %
IRON: 63 ug/dL (ref 27–159)
Immature Grans (Abs): 0 10*3/uL (ref 0.0–0.1)
LDH: 157 IU/L (ref 119–226)
LDL Calculated: 124 mg/dL — ABNORMAL HIGH (ref 0–99)
LYMPHS ABS: 1.2 10*3/uL (ref 0.7–3.1)
Lymphs: 20 %
MCH: 29 pg (ref 26.6–33.0)
MCHC: 31.2 g/dL — AB (ref 31.5–35.7)
MCV: 93 fL (ref 79–97)
MONOS ABS: 0.3 10*3/uL (ref 0.1–0.9)
Monocytes: 4 %
NEUTROS PCT: 74 %
Neutrophils Absolute: 4.5 10*3/uL (ref 1.4–7.0)
PHOSPHORUS: 2.5 mg/dL (ref 2.5–4.5)
PLATELETS: 290 10*3/uL (ref 150–379)
Potassium: 4.6 mmol/L (ref 3.5–5.2)
RBC: 4.2 x10E6/uL (ref 3.77–5.28)
RDW: 14.3 % (ref 12.3–15.4)
Sodium: 141 mmol/L (ref 134–144)
T3 UPTAKE RATIO: 29 % (ref 24–39)
T4, Total: 4.8 ug/dL (ref 4.5–12.0)
TOTAL PROTEIN: 7 g/dL (ref 6.0–8.5)
TSH: 2.13 u[IU]/mL (ref 0.450–4.500)
Triglycerides: 56 mg/dL (ref 0–149)
Uric Acid: 3.8 mg/dL (ref 2.5–7.1)
VLDL CHOLESTEROL CAL: 11 mg/dL (ref 5–40)
WBC: 6.1 10*3/uL (ref 3.4–10.8)

## 2015-11-29 LAB — HGB A1C W/O EAG: Hgb A1c MFr Bld: 5.2 % (ref 4.8–5.6)

## 2015-11-30 ENCOUNTER — Other Ambulatory Visit (HOSPITAL_COMMUNITY)
Admission: RE | Admit: 2015-11-30 | Discharge: 2015-11-30 | Disposition: A | Discharge status: Home / Self Care | Payer: No Typology Code available for payment source | Source: Ambulatory Visit | Attending: Family Medicine | Admitting: Family Medicine

## 2015-11-30 ENCOUNTER — Encounter: Payer: Self-pay | Admitting: Family Medicine

## 2015-11-30 ENCOUNTER — Ambulatory Visit (INDEPENDENT_AMBULATORY_CARE_PROVIDER_SITE_OTHER): Payer: No Typology Code available for payment source | Admitting: Family Medicine

## 2015-11-30 VITALS — BP 124/78 | HR 86 | Temp 98.5°F | Ht 63.0 in | Wt 111.5 lb

## 2015-11-30 DIAGNOSIS — Z0000 Encounter for general adult medical examination without abnormal findings: Secondary | ICD-10-CM | POA: Diagnosis not present

## 2015-11-30 DIAGNOSIS — Z01419 Encounter for gynecological examination (general) (routine) without abnormal findings: Secondary | ICD-10-CM | POA: Diagnosis present

## 2015-11-30 DIAGNOSIS — Z1151 Encounter for screening for human papillomavirus (HPV): Secondary | ICD-10-CM | POA: Diagnosis present

## 2015-11-30 DIAGNOSIS — F32A Depression, unspecified: Secondary | ICD-10-CM

## 2015-11-30 DIAGNOSIS — Z124 Encounter for screening for malignant neoplasm of cervix: Secondary | ICD-10-CM

## 2015-11-30 DIAGNOSIS — F329 Major depressive disorder, single episode, unspecified: Secondary | ICD-10-CM

## 2015-11-30 MED ORDER — SERTRALINE HCL 100 MG PO TABS: ORAL_TABLET | ORAL | Status: DC

## 2015-11-30 NOTE — Progress Notes (Signed)
Pre visit review using our clinic review tool, if applicable. No additional management support is needed unless otherwise documented below in the visit note. 

## 2015-11-30 NOTE — Progress Notes (Signed)
Subjective:    Patient ID: Kara [name], female    DOB: 07-18-1970, 45 y.o.   MRN: [id number]  HPI  45 yo very pleasant G1P1 here for CPX.   Depression- deteriorated.  restarted Zoloft 50 mg in 2012.  Felt it helped with her symptoms tremendously.  Had a previously adverse reaction to Wellbutrin- made her more anxious.   No SI or HI. Sleeping ok. Wt Readings from Last 3 Encounters:  11/30/15 111 lb 8 oz (50.576 kg)  08/03/14 107 lb 8 oz (48.762 kg)  07/20/13 114 lb 12 oz (52.05 kg)    Tdap 07/15/12   No h/o abnormal pap smears.  Sexually active with husband only.  They are considering having another child. Last pap smear 07/17/2012- done by me.    Lab Results  Component Value Date   CHOL 203* 07/27/2014   HDL 63.80 07/27/2014   LDLCALC 130* 07/27/2014   LDLDIRECT 141.4 07/10/2013   TRIG 48.0 07/27/2014   CHOLHDL 3 07/27/2014    Lab Results  Component Value Date   WBC 5.9 07/27/2014   HGB 13.3 07/27/2014   HCT 39.5 07/27/2014   MCV 91.0 07/27/2014   PLT 316.0 07/27/2014   Lab Results  Component Value Date   TSH 1.57 07/27/2014   Lab Results  Component Value Date   NA 138 07/27/2014   K 4.0 07/27/2014   CL 104 07/27/2014   CO2 29 07/27/2014   Lab Results  Component Value Date   CREATININE 0.80 07/27/2014   Lab Results  Component Value Date   ALT 10 07/27/2014   AST 14 07/27/2014   ALKPHOS 34* 07/27/2014   BILITOT 0.5 07/27/2014     Patient Active Problem List   Diagnosis Date Noted  . Routine general medical examination at a health care facility 07/15/2012  . Depression 04/09/2011   Past Medical History  Diagnosis Date  . Depression   . Mitral valve prolapse    No past surgical history on file. Social History  Substance Use Topics  . Smoking status: Never Smoker   . Smokeless tobacco: Never Used  . Alcohol Use: 0.0 oz/week    0 Standard drinks or equivalent per week   Family History  Problem Relation Age of Onset  . Cancer Maternal  Aunt 55    breast   Allergies  Allergen Reactions  . Sulfa Antibiotics Shortness Of Breath   Current Outpatient Prescriptions on File Prior to Visit  Medication Sig Dispense Refill  . cetirizine (ZYRTEC) 10 MG tablet Take by mouth daily as needed     . diphenhydrAMINE (BENADRYL ALLERGY) 25 MG tablet Take by mouth as directed as needed.     . sertraline (ZOLOFT) 100 MG tablet TAKE 1 TABLET (100 MG TOTAL) BY MOUTH DAILY. OFFICE VISIT REQUIRED FOR ADDITIONAL REFILLS 30 tablet 0   No current facility-administered medications on file prior to visit.   The PMH, PSH, Social History, Family History, Medications, and allergies have been reviewed in [facility], and have been updated if relevant.   Review of Systems See HPI Patient reports no  vision/ hearing changes,anorexia, , fever ,adenopathy, persistant / recurrent hoarseness, swallowing issues, chest pain, edema,persistant / recurrent cough, hemoptysis, dyspnea(rest, exertional, paroxysmal nocturnal), gastrointestinal  bleeding (melena, rectal bleeding), abdominal pain, excessive heart burn, GU symptoms(dysuria, hematuria, pyuria, voiding/incontinence  Issues) syncope, focal weakness, severe memory loss, concerning skin lesions,  abnormal bruising/bleeding, major joint swelling, breast masses or abnormal vaginal bleeding.       Objective:  Physical Exam BP 124/78 mmHg  Pulse 86  Temp(Src) 98.5 F (36.9 C) (Oral)  Ht 5\' 3"  (1.6 m)  Wt 111 lb 8 oz (50.576 kg)  BMI 19.76 kg/m2  LMP 11/19/2015   General:  Well-developed,well-nourished,in no acute distress; alert,appropriate and cooperative throughout examination Head:  normocephalic and atraumatic.   Eyes:  vision grossly intact, pupils equal, pupils round, and pupils reactive to light.   Ears:  R ear normal and L ear normal.   Nose:  no external deformity.   Mouth:  good dentition.   Neck:  No deformities, masses, or tenderness noted. Breasts:  +implants bilaterally, No mass, nodules,  thickening, tenderness, bulging, retraction, inflamation, nipple discharge or skin changes noted.   Lungs:  Normal respiratory effort, chest expands symmetrically. Lungs are clear to auscultation, no crackles or wheezes. Heart:  Normal rate and regular rhythm. S1 and S2 normal without gallop, murmur, click, rub or other extra sounds. Abdomen:  Bowel sounds positive,abdomen soft and non-tender without masses, organomegaly or hernias noted. Rectal:  no external abnormalities.   Genitalia:  Pelvic Exam:        External: normal female genitalia without lesions or masses        Vagina: normal without lesions or masses        Cervix: normal without lesions or masses        Adnexa: normal bimanual exam without masses or fullness        Uterus: normal by palpation        Pap smear: performed Msk:  No deformity or scoliosis noted of thoracic or lumbar spine.   Extremities:  No clubbing, cyanosis, edema, or deformity noted with normal full range of motion of all joints.   Neurologic:  alert & oriented X3 and gait normal.   Skin:  Intact without suspicious lesions or rashes Cervical Nodes:  No lymphadenopathy noted Axillary Nodes:  No palpable lymphadenopathy Psych:  Cognition and judgment appear intact. Alert and cooperative with normal attention span and concentration. No apparent delusions, illusions, hallucinations       Assessment & Plan:

## 2015-11-30 NOTE — Patient Instructions (Signed)
Great to see you. Please call to schedule your mammogram. 

## 2015-11-30 NOTE — Assessment & Plan Note (Signed)
Reviewed preventive care protocols, scheduled due services, and updated immunizations Discussed nutrition, exercise, diet, and healthy lifestyle.  

## 2015-11-30 NOTE — Assessment & Plan Note (Signed)
Well controlled on current dose of zoloft.  eRx sent.

## 2015-11-30 NOTE — Addendum Note (Signed)
Addended by: Sydell AxonLAWS, REGINA C on: 11/30/2015 12:49 PM   Modules accepted: Orders

## 2015-12-01 LAB — CYTOLOGY - PAP

## 2015-12-02 ENCOUNTER — Encounter: Payer: Self-pay | Admitting: *Deleted

## 2016-08-16 ENCOUNTER — Ambulatory Visit: Payer: Self-pay | Admitting: Registered Nurse

## 2016-08-16 VITALS — BP 140/82 | HR 100 | Temp 98.6°F

## 2016-08-16 DIAGNOSIS — J301 Allergic rhinitis due to pollen: Secondary | ICD-10-CM

## 2016-08-16 DIAGNOSIS — J181 Lobar pneumonia, unspecified organism: Principal | ICD-10-CM

## 2016-08-16 DIAGNOSIS — J189 Pneumonia, unspecified organism: Secondary | ICD-10-CM

## 2016-08-16 DIAGNOSIS — H65193 Other acute nonsuppurative otitis media, bilateral: Secondary | ICD-10-CM

## 2016-08-16 MED ORDER — BROMPHENIRAMINE-PHENYLEPHRINE 2-5 MG/5ML PO ELIX: 5.0000 mL | ORAL_SOLUTION | Freq: Two times a day (BID) | ORAL | 0 refills | Status: DC | PRN

## 2016-08-16 MED ORDER — FLUTICASONE PROPIONATE 50 MCG/ACT NA SUSP: 1.0000 | Freq: Two times a day (BID) | NASAL | 1 refills | Status: DC | PRN

## 2016-08-16 MED ORDER — PSEUDOEPH-BROMPHEN-DM 30-2-10 MG/5ML PO SYRP: 5.0000 mL | ORAL_SOLUTION | Freq: Every evening | ORAL | 0 refills | Status: AC | PRN

## 2016-08-16 MED ORDER — ALBUTEROL SULFATE HFA 108 (90 BASE) MCG/ACT IN AERS: 1.0000 | INHALATION_SPRAY | RESPIRATORY_TRACT | 0 refills | Status: DC | PRN

## 2016-08-16 MED ORDER — SALINE SPRAY 0.65 % NA SOLN: 1.0000 | NASAL | 0 refills | Status: DC | PRN

## 2016-08-16 NOTE — Progress Notes (Signed)
Subjective:    Patient ID: Kara [name], female    DOB: 05/23/1971, 46 y.o.   MRN: [id number]  Married 45y/o caucasian female here for cough x4 days. Productive since yesterday, brown sputum. Sore throat. Ear pain left greater than right denied discharge. Denies sinus pain/pressure. Also with stomach bothering her, "gassy", since yesterday. Denies diarrhea or constipation. PMHx seasonal allergies prn flonase use hasn't started yet this year buys OTC; has tried dayquil/nyquil without much relief of cough      Review of Systems  Constitutional: Positive for fatigue. Negative for activity change, appetite change, chills, diaphoresis, fever and unexpected weight change.  HENT: Positive for ear pain, postnasal drip and sore throat. Negative for congestion, dental problem, drooling, ear discharge, facial swelling, hearing loss, mouth sores, nosebleeds, rhinorrhea, sinus pain, sinus pressure, sneezing, tinnitus, trouble swallowing and voice change.   Eyes: Negative for photophobia, pain, discharge, redness, itching and visual disturbance.  Respiratory: Positive for cough, shortness of breath and wheezing. Negative for choking, chest tightness and stridor.   Cardiovascular: Negative for chest pain, palpitations and leg swelling.  Gastrointestinal: Positive for nausea. Negative for abdominal distention, abdominal pain, blood in stool, constipation, diarrhea and vomiting.  Endocrine: Negative for cold intolerance and heat intolerance.  Genitourinary: Negative for difficulty urinating, dysuria and hematuria.  Musculoskeletal: Negative for arthralgias, back pain, gait problem, joint swelling, myalgias, neck pain and neck stiffness.  Skin: Negative for color change, pallor, rash and wound.  Allergic/Immunologic: Positive for environmental allergies. Negative for food allergies.  Neurological: Positive for headaches. Negative for dizziness, tremors, seizures, syncope, facial asymmetry, speech difficulty,  weakness, light-headedness and numbness.  Hematological: Negative for adenopathy. Does not bruise/bleed easily.  Psychiatric/Behavioral: Positive for sleep disturbance. Negative for agitation, behavioral problems and confusion.       Objective:   Physical Exam  Constitutional: She is oriented to person, place, and time. Vital signs are normal. She appears well-developed and well-nourished. She is active and cooperative.  Non-toxic appearance. She does not have a sickly appearance. She appears ill. No distress.  HENT:  Head: Normocephalic and atraumatic.  Right Ear: Hearing, external ear and ear canal normal. Tympanic membrane is injected and erythematous. A middle ear effusion is present.  Left Ear: Hearing, external ear and ear canal normal. Tympanic membrane is injected, erythematous and bulging. A middle ear effusion is present.  Nose: Mucosal edema and rhinorrhea present. No nose lacerations, sinus tenderness, nasal deformity, septal deviation or nasal septal hematoma. No epistaxis.  No foreign bodies. Right sinus exhibits no maxillary sinus tenderness and no frontal sinus tenderness. Left sinus exhibits no maxillary sinus tenderness and no frontal sinus tenderness.  Mouth/Throat: Uvula is midline and mucous membranes are normal. Mucous membranes are not pale, not dry and not cyanotic. She does not have dentures. No oral lesions. No trismus in the jaw. Normal dentition. No dental abscesses, uvula swelling, lacerations or dental caries. Posterior oropharyngeal edema and posterior oropharyngeal erythema present. No oropharyngeal exudate or tonsillar abscesses.  Cobblestoning posterior pharynx; bilateral TMs air fluid level clear injection/erythema 25% TM and 6oclock auditory canal proximal to distal; bilateral allergic shiners; bilateral nasal turbinates edema/erythema clear discharge  Eyes: Conjunctivae, EOM and lids are normal. Pupils are equal, round, and reactive to light. Right eye exhibits no  chemosis, no discharge, no exudate and no hordeolum. No foreign body present in the right eye. Left eye exhibits no chemosis, no discharge, no exudate and no hordeolum. No foreign body present in the left eye. Right  conjunctiva is not injected. Right conjunctiva has no hemorrhage. Left conjunctiva is not injected. Left conjunctiva has no hemorrhage. No scleral icterus. Right eye exhibits normal extraocular motion and no nystagmus. Left eye exhibits normal extraocular motion and no nystagmus. Right pupil is round and reactive. Left pupil is round and reactive. Pupils are equal.  Neck: Trachea normal and normal range of motion. Neck supple. No tracheal tenderness, no spinous process tenderness and no muscular tenderness present. No neck rigidity. No tracheal deviation, no edema, no erythema and normal range of motion present. No thyroid mass and no thyromegaly present.  Cardiovascular: Normal rate, regular rhythm, S1 normal, S2 normal, normal heart sounds and intact distal pulses.  PMI is not displaced.  Exam reveals no gallop and no friction rub.   No murmur heard. Pulmonary/Chest: Effort normal. No accessory muscle usage or stridor. No respiratory distress. She has decreased breath sounds in the right lower field. She has no wheezes. She has rhonchi in the left lower field. She has no rales. She exhibits no tenderness.  Intermittent nonproductive harsh cough; speaks full sentences without difficulty; SP02 RA when speaking 97-98%   Abdominal: Soft. She exhibits no distension.  Musculoskeletal: Normal range of motion. She exhibits no edema or tenderness.       Right shoulder: Normal.       Left shoulder: Normal.       Right hip: Normal.       Left hip: Normal.       Right knee: Normal.       Left knee: Normal.       Right ankle: Normal.       Left ankle: Normal.       Cervical back: Normal.       Right hand: Normal.       Left hand: Normal.  Lymphadenopathy:       Head (right side): No submental,  no submandibular, no tonsillar, no preauricular, no posterior auricular and no occipital adenopathy present.       Head (left side): No submental, no submandibular, no tonsillar, no preauricular, no posterior auricular and no occipital adenopathy present.    She has no cervical adenopathy.       Right cervical: No superficial cervical, no deep cervical and no posterior cervical adenopathy present.      Left cervical: No superficial cervical, no deep cervical and no posterior cervical adenopathy present.  Neurological: She is alert and oriented to person, place, and time. She has normal strength. She is not disoriented. She displays no atrophy and no tremor. No cranial nerve deficit or sensory deficit. She exhibits normal muscle tone. She displays no seizure activity. Coordination and gait normal. GCS eye subscore is 4. GCS verbal subscore is 5. GCS motor subscore is 6.  Skin: Skin is warm, dry and intact. No abrasion, no bruising, no burn, no ecchymosis, no laceration, no lesion, no petechiae and no rash noted. She is not diaphoretic. No cyanosis or erythema. No pallor. Nails show no clubbing.  Psychiatric: She has a normal mood and affect. Her speech is normal and behavior is normal. Judgment and thought content normal. Cognition and memory are normal.  Nursing note and vitals reviewed.         Assessment & Plan:  A-LLL community acquired pneumonia; bilateral otitis media acute nonsupportive; seasonal allergic rhinitis  P-Supportive treatment. Augmentin 875mg  po BID x 10 days #20 RF0.  Tylenol 1000mg  po QID prn pain   No evidence of invasive bacterial infection,  non toxic and well hydrated.   I do not see where any further testing or imaging is necessary at this time.   I will suggest supportive care, rest, good hygiene and encourage the patient to take adequate fluids.  The patient is to return to clinic or EMERGENCY ROOM if symptoms worsen or change significantly e.g. ear pain, fever, purulent  discharge from ears or bleeding.    Patient verbalized agreement and understanding of treatment plan.    Restart flonase 1 spray each nostril BID, saline 2 sprays each nostril q2h prn congestion given 1 UD bottle from clinic stock.Avoid triggers if possible.  Shower prior to bedtime if exposed to triggers.  If allergic dust/dust mites recommend mattress/pillow covers/encasements; washing linens, vacuuming, sweeping, dusting weekly.  Call or return to clinic as needed if these symptoms worsen or fail to improve as anticipated..  Patient verbalized understanding of instructions, agreed with plan of care and had no further questions at this time.  P2:  Avoidance and hand washing.  Albuterol MDI 1-2 puffs po q4-6h prn protracted cough/wheeze/sob #1 RF0; augmentin 875mg  po BID x 10 days #20 RF0 dispensed from [facility].  Brompeniramine/pseudoephedrine/dextromethorphan 5ml po qhs #118 RF0.   Discussed signs/symptoms serotonin syndrome and side effects of albuterol inhaler with patient when to stop prn medications.  Patient verbalized understanding and wants to proceed with these medications use. Honey with lemon.  Hydrate, rest. Pneumonia simple, community acquired, may have started as viral (probably respiratory syncytial, parainfluenza, influenza, or adenovirus), but now evidence of acute purulent pneumonia bronchial edema and mucus formation.   Differential Diagnosis:  reactive airway disease (asthma, allergic aspergillosis (eosinophilia), chronic bronchitis, respiratory infection (Sinusitis, Common cold, pneumonia), congestive heart failure, reflux esophagitis, bronchogenic tumor, aspiration syndromes and/or exposure irritants/tobacco smoke.  In this case, there is no evidence of any invasive bacterial illness.    Advise supportive care with rest, encourage fluids, good hygiene and watch for any worsening symptoms.  If they were to develop:  come back to the office or go to the emergency room if after  hours. Without high fever, severe dyspnea, lack of physical findings or other risk factors, I will hold on a chest radiograph and CBC at this time.  Tylenol, one to two tablets every four hours as needed for fever or myalgias.   No aspirin.  Patient instructed to follow up in one week if no resolution with plan of care or sooner if symptoms worsen. Patient verbalized agreement and understanding of treatment plan.  P2:  hand washing and cover cough    Usually no specific medical treatment is needed if a virus is causing the sore throat.  The throat most often gets better on its own within 5 to 7 days.  Antibiotic medicine does not cure viral pharyngitis.   For acute pharyngitis caused by bacteria, your healthcare provider will prescribe an antibiotic.  Marland Kitchen Do not smoke.  Marland Kitchen Avoid secondhand smoke and other air pollutants.  . Use a cool mist humidifier to add moisture to the air.  . Get plenty of rest.  . You may want to rest your throat by talking less and eating a diet that is mostly liquid or soft for a day or two.   Marland Kitchen Nonprescription throat lozenges and mouthwashes should help relieve the soreness.   . Gargling with warm saltwater and drinking warm liquids may help.  (You can make a saltwater solution by adding 1/4 teaspoon of salt to 8 ounces, or 240 mL, of  warm water.)  . A nonprescription pain reliever such as aspirin, acetaminophen, or ibuprofen may ease general aches and pains.   FOLLOW UP with clinic provider if no improvements in the next 7-10 days.  Patient verbalized understanding of instructions and agreed with plan of care. P2:  Hand washing and diet.

## 2016-08-16 NOTE — Patient Instructions (Signed)
Start augmentin 875mg  by mouth twice a day x [facility] with lemon Bromphed 5ml by mouth at bedtime as needed cough Cough lozenges by mouth every 2 hours as needed cough Albuterol MDI 90mcg 1-2 puffs by mouth every 4-6 hours as needed protracted cough/wheeze Hydrate Rest Seek re-evaluation if coughing up blood, shortness of breath, chest pain, fever greater than 100.77F

## 2016-11-12 ENCOUNTER — Ambulatory Visit: Payer: Self-pay | Admitting: *Deleted

## 2016-11-12 VITALS — BP 115/78 | HR 85 | Ht 63.0 in | Wt 110.0 lb

## 2016-11-12 DIAGNOSIS — Z0000 Encounter for general adult medical examination without abnormal findings: Secondary | ICD-10-CM

## 2016-11-12 NOTE — Progress Notes (Signed)
[employer] insurance premium discount evaluation: Labs Drawn. Replacements ROI form signed. Tobacco Free Attestation form signed.  Forms placed in paper chart.  

## 2016-11-13 LAB — CMP12+LP+TP+TSH+6AC+CBC/D/PLT
ALT: 11 IU/L (ref 0–32)
AST: 19 IU/L (ref 0–40)
Albumin/Globulin Ratio: 2.1 (ref 1.2–2.2)
Albumin: 4.6 g/dL (ref 3.5–5.5)
Alkaline Phosphatase: 37 IU/L — ABNORMAL LOW (ref 39–117)
BASOS: 1 %
BUN / CREAT RATIO: 17 (ref 9–23)
BUN: 14 mg/dL (ref 6–24)
Basophils Absolute: 0 10*3/uL (ref 0.0–0.2)
Bilirubin Total: 0.4 mg/dL (ref 0.0–1.2)
CALCIUM: 8.9 mg/dL (ref 8.7–10.2)
CHOL/HDL RATIO: 3.3 ratio (ref 0.0–4.4)
CREATININE: 0.84 mg/dL (ref 0.57–1.00)
Chloride: 104 mmol/L (ref 96–106)
Cholesterol, Total: 187 mg/dL (ref 100–199)
EOS (ABSOLUTE): 0.4 10*3/uL (ref 0.0–0.4)
Eos: 7 %
Estimated CHD Risk: <0.5 times avg. (ref 0.0–1.0)
Free Thyroxine Index: 1.4 (ref 1.2–4.9)
GFR, EST AFRICAN AMERICAN: 97 mL/min/{1.73_m2} (ref >59)
GFR, EST NON AFRICAN AMERICAN: 84 mL/min/{1.73_m2} (ref >59)
GGT: 9 IU/L (ref 0–60)
GLUCOSE: 95 mg/dL (ref 65–99)
Globulin, Total: 2.2 g/dL (ref 1.5–4.5)
HDL: 56 mg/dL (ref >39)
HEMATOCRIT: 36.6 % (ref 34.0–46.6)
Hemoglobin: 11.5 g/dL (ref 11.1–15.9)
Immature Grans (Abs): 0 10*3/uL (ref 0.0–0.1)
Immature Granulocytes: 0 %
Iron: 117 ug/dL (ref 27–159)
LDH: 173 IU/L (ref 119–226)
LDL Calculated: 121 mg/dL — ABNORMAL HIGH (ref 0–99)
Lymphocytes Absolute: 1.4 10*3/uL (ref 0.7–3.1)
Lymphs: 22 %
MCH: 28.3 pg (ref 26.6–33.0)
MCHC: 31.4 g/dL — ABNORMAL LOW (ref 31.5–35.7)
MCV: 90 fL (ref 79–97)
Monocytes Absolute: 0.2 10*3/uL (ref 0.1–0.9)
Monocytes: 4 %
NEUTROS ABS: 4.1 10*3/uL (ref 1.4–7.0)
Neutrophils: 66 %
PHOSPHORUS: 3.2 mg/dL (ref 2.5–4.5)
POTASSIUM: 4.5 mmol/L (ref 3.5–5.2)
Platelets: 300 10*3/uL (ref 150–379)
RBC: 4.07 x10E6/uL (ref 3.77–5.28)
RDW: 14.2 % (ref 12.3–15.4)
SODIUM: 141 mmol/L (ref 134–144)
T3 Uptake Ratio: 25 % (ref 24–39)
T4 TOTAL: 5.4 ug/dL (ref 4.5–12.0)
TSH: 2.64 u[IU]/mL (ref 0.450–4.500)
Total Protein: 6.8 g/dL (ref 6.0–8.5)
Triglycerides: 52 mg/dL (ref 0–149)
URIC ACID: 3.6 mg/dL (ref 2.5–7.1)
VLDL Cholesterol Cal: 10 mg/dL (ref 5–40)
WBC: 6.2 10*3/uL (ref 3.4–10.8)

## 2016-11-13 LAB — HGB A1C W/O EAG: HEMOGLOBIN A1C: 5.5 % (ref 4.8–5.6)

## 2016-11-14 NOTE — Progress Notes (Signed)
Pt was able to view results and note from NP online via MyChart. Had no questions. Diet recommendations for reducing LDL further discussed. Will route to PCP per pt request.

## 2016-11-30 ENCOUNTER — Other Ambulatory Visit: Payer: Self-pay

## 2016-12-03 ENCOUNTER — Ambulatory Visit (INDEPENDENT_AMBULATORY_CARE_PROVIDER_SITE_OTHER): Payer: No Typology Code available for payment source | Admitting: Family Medicine

## 2016-12-03 ENCOUNTER — Encounter: Payer: Self-pay | Admitting: Family Medicine

## 2016-12-03 VITALS — BP 106/64 | HR 79 | Temp 98.7°F | Ht 63.0 in | Wt 112.5 lb

## 2016-12-03 DIAGNOSIS — Z0000 Encounter for general adult medical examination without abnormal findings: Secondary | ICD-10-CM

## 2016-12-03 DIAGNOSIS — Z1231 Encounter for screening mammogram for malignant neoplasm of breast: Secondary | ICD-10-CM

## 2016-12-03 DIAGNOSIS — F329 Major depressive disorder, single episode, unspecified: Secondary | ICD-10-CM

## 2016-12-03 DIAGNOSIS — Z1239 Encounter for other screening for malignant neoplasm of breast: Secondary | ICD-10-CM

## 2016-12-03 DIAGNOSIS — F32A Depression, unspecified: Secondary | ICD-10-CM

## 2016-12-03 NOTE — Assessment & Plan Note (Signed)
Reviewed preventive care protocols, scheduled due services, and updated immunizations Discussed nutrition, exercise, diet, and healthy lifestyle.  

## 2016-12-03 NOTE — Patient Instructions (Addendum)
Great to see you.  Please stop by to see Kara [name] on your way out.  Please call to schedule your mammogram.

## 2016-12-03 NOTE — Assessment & Plan Note (Signed)
Deteriorated. Continue current dose of zoloft. Referral placed for psychotherapy.

## 2016-12-03 NOTE — Progress Notes (Signed)
Subjective:    Patient ID: Kara [name], female    DOB: 03/27/1971, 46 y.o.   MRN: [id number]  HPI  46 yo very pleasant G1P1 here for CPX.  Last mammogram 08/17/13  Depression- deteriorated, improved controlled initially with higher dose of zoloft- 100 mg daily. PHQ 5.  She has been having more anxiety and depression.  No SI or HI.  Under more stress at home and at work. She is going back to school.  Wt Readings from Last 3 Encounters:  12/03/16 112 lb 8 oz (51 kg)  11/12/16 110 lb (49.9 kg)  11/30/15 111 lb 8 oz (50.6 kg)     No h/o abnormal pap smears.  Sexually active with husband only.  Last pap smear 11/30/15- done by me.   Lab Results  Component Value Date   CHOL 187 11/12/2016   HDL 56 11/12/2016   LDLCALC 121 (H) 11/12/2016   LDLDIRECT 141.4 07/10/2013   TRIG 52 11/12/2016   CHOLHDL 3.3 11/12/2016    Lab Results  Component Value Date   WBC 6.2 11/12/2016   HGB 11.5 11/12/2016   HCT 36.6 11/12/2016   MCV 90 11/12/2016   PLT 300 11/12/2016   Lab Results  Component Value Date   TSH 2.640 11/12/2016   Lab Results  Component Value Date   NA 141 11/12/2016   K 4.5 11/12/2016   CL 104 11/12/2016   CO2 29 07/27/2014   Lab Results  Component Value Date   CREATININE 0.84 11/12/2016   Lab Results  Component Value Date   ALT 11 11/12/2016   AST 19 11/12/2016   GGT 9 11/12/2016   ALKPHOS 37 (L) 11/12/2016   BILITOT 0.4 11/12/2016     Patient Active Problem List   Diagnosis Date Noted  . Routine general medical examination at a health care facility 07/15/2012  . Depression 04/09/2011   Past Medical History:  Diagnosis Date  . Depression   . Mitral valve prolapse    No past surgical history on file. Social History  Substance Use Topics  . Smoking status: Never Smoker  . Smokeless tobacco: Never Used  . Alcohol use 0.0 oz/week   Family History  Problem Relation Age of Onset  . Cancer Maternal Aunt 55       breast   Allergies  Allergen  Reactions  . Sulfa Antibiotics Shortness Of Breath   Current Outpatient Prescriptions on File Prior to Visit  Medication Sig Dispense Refill  . cetirizine (ZYRTEC) 10 MG tablet Take by mouth daily as needed     . diphenhydrAMINE (BENADRYL ALLERGY) 25 MG tablet Take by mouth as directed as needed.     . fluticasone (FLONASE) 50 MCG/ACT nasal spray Place 1 spray into both nostrils 2 (two) times daily as needed for allergies or rhinitis. 16 g 1  . sertraline (ZOLOFT) 100 MG tablet TAKE 1 TABLET (100 MG TOTAL) BY MOUTH DAILY. 90 tablet 3   No current facility-administered medications on file prior to visit.    The PMH, PSH, Social History, Family History, Medications, and allergies have been reviewed in [facility], and have been updated if relevant.  Review of Systems  Constitutional: Negative.   HENT: Negative.   Eyes: Negative.   Respiratory: Negative.   Cardiovascular: Negative.   Gastrointestinal: Negative.   Endocrine: Negative.   Genitourinary: Negative.   Musculoskeletal: Negative.   Allergic/Immunologic: Negative.   Neurological: Negative.   Hematological: Negative.   Psychiatric/Behavioral: Positive for dysphoric mood. Negative for  decreased concentration, sleep disturbance and suicidal ideas. The patient is nervous/anxious.   All other systems reviewed and are negative.       Objective:   Physical Exam BP 106/64 (BP Location: Left Arm, Patient Position: Sitting, Cuff Size: Normal)   Pulse 79   Temp 98.7 F (37.1 C) (Oral)   Ht 5\' 3"  (1.6 m)   Wt 112 lb 8 oz (51 kg)   LMP 11/11/2016   SpO2 98%   BMI 19.93 kg/m    General:  Well-developed,well-nourished,in no acute distress; alert,appropriate and cooperative throughout examination Head:  normocephalic and atraumatic.   Eyes:  vision grossly intact, pupils equal, pupils round, and pupils reactive to light.   Ears:  R ear normal and L ear normal.   Nose:  no external deformity.   Mouth:  good dentition.   Neck:  No  deformities, masses, or tenderness noted. Breasts:  +implants bilaterally, No mass, nodules, thickening, tenderness, bulging, retraction, inflamation, nipple discharge or skin changes noted.  Lungs:  Normal respiratory effort, chest expands symmetrically. Lungs are clear to auscultation, no crackles or wheezes. Heart:  Normal rate and regular rhythm. S1 and S2 normal without gallop, murmur, click, rub or other extra sounds. Abdomen:  Bowel sounds positive,abdomen soft and non-tender without masses, organomegaly or hernias noted. Msk:  No deformity or scoliosis noted of thoracic or lumbar spine.   Extremities:  No clubbing, cyanosis, edema, or deformity noted with normal full range of motion of all joints.   Neurologic:  alert & oriented X3 and gait normal.   Skin:  Intact without suspicious lesions or rashes Cervical Nodes:  No lymphadenopathy noted Axillary Nodes:  No palpable lymphadenopathy Psych:  Cognition and judgment appear intact. Alert and cooperative with normal attention span and concentration. No apparent delusions, illusions, hallucinations       Assessment & Plan:

## 2016-12-05 ENCOUNTER — Telehealth: Payer: Self-pay | Admitting: Family Medicine

## 2016-12-05 NOTE — Telephone Encounter (Signed)
Patient needs her immunization records because she's going back to school.  Please call patient.

## 2016-12-05 NOTE — Telephone Encounter (Signed)
Record printed and placed in yellow folder.

## 2016-12-19 ENCOUNTER — Other Ambulatory Visit: Payer: Self-pay | Admitting: Family Medicine

## 2016-12-19 NOTE — Telephone Encounter (Signed)
Last refill 11/30/15 Last OV 12/03/16 Ok to refill?

## 2017-01-09 ENCOUNTER — Other Ambulatory Visit: Payer: Self-pay | Admitting: Family Medicine

## 2017-01-09 DIAGNOSIS — Z1231 Encounter for screening mammogram for malignant neoplasm of breast: Secondary | ICD-10-CM

## 2017-01-30 ENCOUNTER — Ambulatory Visit (INDEPENDENT_AMBULATORY_CARE_PROVIDER_SITE_OTHER): Payer: No Typology Code available for payment source | Admitting: Psychology

## 2017-01-30 DIAGNOSIS — F4323 Adjustment disorder with mixed anxiety and depressed mood: Secondary | ICD-10-CM | POA: Diagnosis not present

## 2017-01-30 DIAGNOSIS — Z630 Problems in relationship with spouse or partner: Secondary | ICD-10-CM | POA: Diagnosis not present

## 2017-02-08 ENCOUNTER — Ambulatory Visit (INDEPENDENT_AMBULATORY_CARE_PROVIDER_SITE_OTHER): Payer: No Typology Code available for payment source | Admitting: Psychology

## 2017-02-08 DIAGNOSIS — Z630 Problems in relationship with spouse or partner: Secondary | ICD-10-CM | POA: Diagnosis not present

## 2017-02-08 DIAGNOSIS — F4323 Adjustment disorder with mixed anxiety and depressed mood: Secondary | ICD-10-CM | POA: Diagnosis not present

## 2017-02-26 ENCOUNTER — Ambulatory Visit: Payer: No Typology Code available for payment source | Admitting: Psychology

## 2017-03-28 ENCOUNTER — Ambulatory Visit: Payer: Self-pay | Admitting: Registered Nurse

## 2017-03-28 VITALS — BP 111/79 | HR 74 | Temp 98.3°F

## 2017-03-28 DIAGNOSIS — M7652 Patellar tendinitis, left knee: Secondary | ICD-10-CM

## 2017-03-28 DIAGNOSIS — M7651 Patellar tendinitis, right knee: Secondary | ICD-10-CM

## 2017-03-28 DIAGNOSIS — S8001XA Contusion of right knee, initial encounter: Secondary | ICD-10-CM

## 2017-03-28 DIAGNOSIS — S76111A Strain of right quadriceps muscle, fascia and tendon, initial encounter: Secondary | ICD-10-CM

## 2017-03-28 NOTE — Patient Instructions (Signed)
Contusion A contusion is a deep bruise. Contusions are the result of a blunt injury to tissues and muscle fibers under the skin. The injury causes bleeding under the skin. The skin overlying the contusion may turn blue, purple, or yellow. Minor injuries will give you a painless contusion, but more severe contusions may stay painful and swollen for a few weeks. What are the causes? This condition is usually caused by a blow, trauma, or direct force to an area of the body. What are the signs or symptoms? Symptoms of this condition include:  Swelling of the injured area.  Pain and tenderness in the injured area.  Discoloration. The area may have redness and then turn blue, purple, or yellow.  How is this diagnosed? This condition is diagnosed based on a physical exam and medical history. An X-ray, CT scan, or MRI may be needed to determine if there are any associated injuries, such as broken bones (fractures). How is this treated? Specific treatment for this condition depends on what area of the body was injured. In general, the best treatment for a contusion is resting, icing, applying pressure to (compression), and elevating the injured area. This is often called the RICE strategy. Over-the-counter anti-inflammatory medicines may also be recommended for pain control. Follow these instructions at home:  Rest the injured area.  If directed, apply ice to the injured area: ? Put ice in a plastic bag. ? Place a towel between your skin and the bag. ? Leave the ice on for 20 minutes, 2-3 times per day.  If directed, apply light compression to the injured area using an elastic bandage. Make sure the bandage is not wrapped too tightly. Remove and reapply the bandage as directed by your health care provider.  If possible, raise (elevate) the injured area above the level of your heart while you are sitting or lying down.  Take over-the-counter and prescription medicines only as told by your health  care provider. Contact a health care provider if:  Your symptoms do not improve after several days of treatment.  Your symptoms get worse.  You have difficulty moving the injured area. Get help right away if:  You have severe pain.  You have numbness in a hand or foot.  Your hand or foot turns pale or cold. This information is not intended to replace advice given to you by your health care provider. Make sure you discuss any questions you have with your health care provider. Document Released: 03/07/2005 Document Revised: 10/06/2015 Document Reviewed: 10/13/2014 Elsevier Interactive Patient Education  2017 [employer] Quadriceps Strain Rehab Ask your health care provider which exercises are safe for you. Do exercises exactly as told by your health care provider and adjust them as directed. It is normal to feel mild stretching, pulling, tightness, or discomfort as you do these exercises, but you should stop right away if you feel sudden pain or your pain gets worse.Do not begin these exercises until told by your health care provider. Stretching and range of motion exercises These exercises warm up your muscles and joints and improve the movement and flexibility of your thigh. These exercises can also help to relieve stiffness or swelling. Exercise A: Heel slides  1. Lie on your back with both knees straight. If this causes back discomfort, bend the knee of your healthy leg, placing your foot flat on the floor. 2. Slowly slide your left / right heel back toward your buttocks until you feel a gentle stretch in the front of  your knee or thigh. 3. Hold for __________ seconds. Then slowly slide your heel back to the starting position. Repeat __________ times. Complete this exercise __________ times a day. Exercise B: Quadriceps stretch, prone  1. Lie on your abdomen on a firm surface, such as a bed or padded floor. 2. Bend your left / right knee and hold your ankle. If you cannot reach  your ankle or pant leg, loop a belt around your foot and grab the belt instead. 3. Gently pull your heel toward your buttocks. Your knee should not slide out to the side. You should feel a stretch in the front of your thigh and knee. 4. Hold this position for __________ seconds. Repeat __________ times. Complete this exercise __________ times a day. Strengthening exercises These exercises build strength and endurance in your thigh. Endurance is the ability to use your muscles for a long time, even after your muscles get tired. Exercise C: Straight leg raises ( quadriceps and hip flexors) Quality counts! Watch for signs that the quadriceps muscle is working to ensure that you are strengthening the correct muscles and not cheating by using healthier muscles. 1. Lie on your back with your left / right leg extended and your other knee bent. 2. Tense the muscles in the front of your left / right thigh. You should see your kneecap slide up or see increased dimpling just above the knee. 3. Tighten these muscles even more and raise your leg 4-6 inches (10-15 cm) off the floor. 4. Hold for __________ seconds. 5. Keep the thigh muscles tense as you lower your leg. 6. Relax the muscles slowly and completely after each repetition. Repeat __________ times. Complete this exercise __________ times a day. Exercise D: Straight leg raises ( hip extensors) 1. Lie on your belly on a bed or a firm surface with a pillow under your hips. 2. Bend your left / right knee so your foot is straight up in the air. 3. Tense your buttock muscles and lift your left / right thigh off the bed. Do not let your back arch. 4. Hold this position for __________ seconds. 5. Slowly return to the starting position. Let your muscles relax completely before doing another repetition. Repeat __________ times. Complete this exercise __________ times a day. Exercise E: Wall sits  Follow the directions for form closely. If you do not place  your feet and knees properly, this can lead to knee pain. 1. Lean back against a smooth wall or door and walk your feet out 18-24 inches (46-61 cm) from it. Place your feet hip-width apart. 2. Slowly slide down the wall or door until your knees bend __________ degrees. Keep your weight back and over your heels, not over your toes. Keep your thighs straight or pointing slightly outward. 3. Hold for __________ seconds. 4. Use your thigh and buttock muscles to push you back up to a standing position. Keep your weight through your heels while you do this. 5. Rest for __________ seconds in between repetitions. Repeat __________ times. Complete this exercise __________ times a day. This information is not intended to replace advice given to you by your health care provider. Make sure you discuss any questions you have with your health care provider. Document Released: 05/28/2005 Document Revised: 02/02/2016 Document Reviewed: 03/01/2015 Elsevier Interactive Patient Education  2018 [employer]. Patellar Tendinitis Rehab Ask your health care provider which exercises are safe for you. Do exercises exactly as told by your health care provider and adjust them as  directed. It is normal to feel mild stretching, pulling, tightness, or discomfort as you do these exercises, but you should stop right away if you feel sudden pain or your pain gets worse.Do not begin these exercises until told by your health care provider. Stretching and range of motion exercises This exercise warms up your muscles and joints and improves the movement and flexibility of your knee. This exercise also helps to relieve pain and stiffness. Exercise A: Hamstring, doorway  1. Lie on your back in front of a doorway with your __________ leg resting against the wall and your other leg flat on the floor in the doorway. There should be a slight bend in your __________ knee. 2. Straighten your __________ knee. You should feel a stretch behind  your knee or thigh. If you do not, scoot your buttocks closer to the door. 3. Hold this position for __________ seconds. Repeat __________ times. Complete this stretch __________ times a day. Strengthening exercises These exercises build strength and endurance in your knee. Endurance is the ability to use your muscles for a long time, even after they get tired. Exercise B: Quadriceps, isometric  1. Lie on your back with your __________ leg extended and your other knee bent. 2. Slowly tense the muscles in the front of your __________ thigh. When you do this, you should see your kneecap slide up toward your hip or see increased dimpling just above the knee. This motion will push the back of your knee toward the floor. If this is painful, try putting a rolled-up hand towel under your knee to support it in a bent position. Change the size of the towel to find a position that allows you to do this exercise without any pain. 3. For __________ seconds, hold the muscle as tight as you can without increasing your pain. 4. Relax the muscles slowly and completely. Repeat __________ times. Complete this exercise __________ times a day. Exercise C: Straight leg raises ( quadriceps) 1. Lie on your back with your __________ leg extended and your other knee bent. 2. Tense the muscles in the front of your __________ thigh. When you do this, you should see your kneecap slide up or see increased dimpling just above the knee. 3. Keep these muscles tight as you raise your leg 4-6 inches (10-15 cm) off the floor. Do not let your moving knee bend. 4. Hold this position for __________ seconds. 5. Keep these muscles tense as you slowly lower your leg. 6. Relax your muscles slowly and completely. Repeat __________ times. Complete this exercise __________ times a day. Exercise D: Squats 1. Stand in front of a table, with your feet and knees pointing straight ahead. You may rest your hands on the table for balance but not  for support. 2. Slowly bend your knees and lower your hips like you are going to sit in a chair. ? Keep your weight over your heels, not over your toes. ? Keep your lower legs upright so they are parallel with the table legs. ? Do not let your hips go lower than your knees. ? Do not bend lower than told by your health care provider. ? If your knee pain increases, do not bend as low. 3. Hold the squat position for __________ seconds. 4. Slowly push with your legs to return to standing. Do not use your hands to pull yourself to standing. Repeat __________ times. Complete this exercise __________ times a day. Exercise E: Step-downs 1. Stand on the edge of a  step. 2. Keeping your weight over your __________ heel, slowly bend your __________ knee to bring your __________ heel toward the floor. Lower your heel as far as you can while keeping control and without increasing any discomfort. ? Do not let your __________ knee come forward. ? Use your leg muscles, not gravity, to lower your body. ? Hold a wall or rail for balance if needed. 3. Slowly push through your heel to lift your body weight back up. 4. Return to the starting position. Repeat __________ times. Complete this exercise __________ times a day. Exercise F: Straight leg raises ( hip abductors) 1. Lie on your side with your __________ leg in the top position. Lie so your head, shoulder, knee, and hip line up. You may bend your lower knee to help you keep your balance. 2. Roll your hips slightly forward, so that your hips are stacked directly over each other and your __________ knee is facing forward. 3. Leading with your heel, lift your top leg 4-6 inches (10-15 cm). You should feel the muscles in your outer hip lifting. ? Do not let your foot drift forward. ? Do not let your knee roll toward the ceiling. 4. Hold this position for __________ seconds. 5. Slowly lower your leg to the starting position. 6. Let your muscles relax  completely after each repetition. Repeat __________ times. Complete this exercise __________ times a day. This information is not intended to replace advice given to you by your health care provider. Make sure you discuss any questions you have with your health care provider. Document Released: 05/28/2005 Document Revised: 02/02/2016 Document Reviewed: 03/01/2015 Elsevier Interactive Patient Education  [employer]2018 [employer] Inc[employer]

## 2017-03-28 NOTE — Progress Notes (Signed)
Subjective:    Patient ID: Kara [name], female    DOB: [birth year], 46 y.o.   MRN: [id number]  45y/o caucasian married female established Pt reports playing volleyball yesterday and went to dive and landed on R knee. Interior of R knee hit ground first. Now with achy constant pain to knee with worsening pain with certain movements. Has loosened up throughout the day. Didn't feel stable to put weight on when first waking up this morning. Has taken Ibuprofen today.  Also has tylenol at home for prn use.  Put ice pack on last night.  When rolled over in bed last night felt sharp stinging pain.  Denied limping today has noticed bruise.  Denied locking/popping/giving out.      Review of Systems     Objective:   Physical Exam  Constitutional: She is oriented to person, place, and time. Vital signs are normal. She appears well-developed and well-nourished. She is active and cooperative.  Non-toxic appearance. She does not have a sickly appearance. She does not appear ill. No distress.  HENT:  Head: Normocephalic and atraumatic.  Right Ear: Hearing and external ear normal.  Left Ear: Hearing and external ear normal.  Nose: Nose normal.  Mouth/Throat: Uvula is midline, oropharynx is clear and moist and mucous membranes are normal. No oropharyngeal exudate.  Eyes: Pupils are equal, round, and reactive to light. Conjunctivae, EOM and lids are normal. No scleral icterus.  Neck: Trachea normal, normal range of motion and phonation normal. Neck supple. No muscular tenderness present. No neck rigidity. No tracheal deviation, no edema, no erythema and normal range of motion present.  Cardiovascular: Normal rate, regular rhythm, normal heart sounds and intact distal pulses.   Pulmonary/Chest: Effort normal. No accessory muscle usage or stridor. No respiratory distress. She has no decreased breath sounds. She has no wheezes. She has no rhonchi. She has no rales.  Abdominal: Normal appearance and bowel  sounds are normal. She exhibits no distension, no fluid wave and no ascites. There is no rigidity and no guarding.  Musculoskeletal: Normal range of motion. She exhibits edema and tenderness. She exhibits no deformity.       Right shoulder: Normal.       Left shoulder: Normal.       Right elbow: Normal.      Left elbow: Normal.       Right hip: She exhibits crepitus.       Left hip: Normal.       Right knee: She exhibits swelling and ecchymosis. She exhibits normal range of motion, no effusion, no deformity, no laceration, no erythema, normal alignment, no LCL laxity, normal patellar mobility, no bony tenderness, normal meniscus and no MCL laxity. Tenderness found. Patellar tendon tenderness noted. No medial joint line, no lateral joint line, no MCL and no LCL tenderness noted.       Left knee: She exhibits normal range of motion, no swelling, no effusion, no ecchymosis, no deformity, no laceration, no erythema, normal alignment, no LCL laxity, normal patellar mobility, no bony tenderness, normal meniscus and no MCL laxity. No tenderness found. No medial joint line, no lateral joint line, no MCL, no LCL and no patellar tendon tenderness noted.       Cervical back: Normal.       Thoracic back: Normal.       Lumbar back: Normal.       Right hand: Normal.       Left hand: Normal.  Legs: Negative mcmurrays/lachmans/valgus/varus stress/anterior/posterior drawer sign negative; crepitus bilateral patellar tendons with flexion/extension non weight bearing; pain with superior displacement right patella; mild 0-1+/4 nonpitting edema right medial knee adjacent to ecchymosis; right hip crepitus with arom  Neurological: She is alert and oriented to person, place, and time. She has normal strength. She is not disoriented. She displays no atrophy and no tremor. No cranial nerve deficit or sensory deficit. She exhibits normal muscle tone. She displays no seizure activity. Coordination and gait normal. GCS eye  subscore is 4. GCS verbal subscore is 5. GCS motor subscore is 6.  On/off exam table; in/out of chair without difficulty; gait sure and steady in hall  Skin: Skin is warm and dry. Abrasion, bruising and ecchymosis noted. No burn, no laceration, no lesion, no petechiae and no rash noted. She is not diaphoretic. No cyanosis or erythema. No pallor. Nails show no clubbing.     Linear fine abrasions bilateral patellar areas and right medial knee ecchymosis less than 2 cm diameter slightly TTP superior and medial to bruising  Psychiatric: She has a normal mood and affect. Her speech is normal and behavior is normal. Judgment and thought content normal. Cognition and memory are normal.          Assessment & Plan:  A-knee contusion right, quadriceps strain initial encounter right, bilateral patellar tendonitis  P-Patient was instructed to rest, ice and elevate leg. Medications as directed. Patient may take tylenol 1000mg  po qid prn pain, biofreeze topical QID prn pain, cryotherapy 15 minutes TID prn swelling/pain.  Avoid activities that worsen pain.  [facility]care handouts on quadriceps strain with rehab exercises, patellar tendonitis with rehab exercises and contusion given to patient.  Refused neoprene knee sleeve, biofreeze at this time.  Has ice pack at home for prn use.  Discussed avoid high impact activities for one week consider ellipitical, recumbant bike, walking treadmill/grass, swimming this week. Call or return to clinic as needed if these symptoms worsen or fail to improve as anticipated. Patient verbalized agreement and understanding of treatment plan.  P2: ROM exercises, Stretching, and Hand out given

## 2017-10-18 ENCOUNTER — Other Ambulatory Visit: Payer: Self-pay | Admitting: Family Medicine

## 2018-01-07 ENCOUNTER — Telehealth: Payer: Self-pay | Admitting: Family Medicine

## 2018-01-07 NOTE — Telephone Encounter (Signed)
I am ok with that -but please warn her that since I am full it is hard to get in with me for acute appointments- that is up to her °Schedule visit if she wants to get established  °

## 2018-01-07 NOTE — Telephone Encounter (Signed)
See below crm Ok to schedule new patient with you or do you want me to offer NP  Copied from CRM [id number]. Topic: Appointment Scheduling - Scheduling Inquiry for Clinic >> Jan 07, 2018 10:00 AM Oneal GroutSebastian, Jennifer S wrote: Reason for CRM: Pt requesting to transfer care to Dr Milinda Antisower , current patient of Dr Dayton MartesAron, aware she is not accepting new patients, requested I ask. Daughter of Thresa Rossatricia Milham.

## 2018-01-08 NOTE — Telephone Encounter (Signed)
I am ok with that -but please warn her that since I am full it is hard to get in with me for acute appointments- that is up to her Schedule visit if she wants to get established

## 2018-01-08 NOTE — Telephone Encounter (Signed)
Left message asking pt to call office  °

## 2018-01-08 NOTE — Telephone Encounter (Signed)
Pt returning Robin's call. 

## 2018-01-09 NOTE — Telephone Encounter (Signed)
Appointment 9/4 Pt aware

## 2018-01-09 NOTE — Telephone Encounter (Signed)
Left message asking pt to call office  °

## 2018-01-14 ENCOUNTER — Ambulatory Visit: Payer: Self-pay | Admitting: Medical

## 2018-01-14 VITALS — BP 121/82 | HR 75 | Temp 97.8°F

## 2018-01-14 DIAGNOSIS — R21 Rash and other nonspecific skin eruption: Secondary | ICD-10-CM

## 2018-01-14 NOTE — Progress Notes (Signed)
Pt c/o bug bite-like spots to bilateral legs, arms, and chest. She reports first one was noticed approx 2 weeks ago and more, approx 15-20 have appeared progressively since then. Some have a linear pattern, some are circular with an ulceration in the center. Mildly itching. Non painful. Not using any creams or OTCs since noticing.  Did mow the yard in the past 2 weeks and was in a heavily weeded/wooded area.

## 2018-01-14 NOTE — Progress Notes (Signed)
   Subjective:    Patient ID: Kara [name], female    DOB: 12/31/1970, 47 y.o.   MRN: [id number]  HPI 47 yo in non acute distress with "bug bites" from mowing the lawn 2 weeks ago not really sure.. Wanted to be checked.  Mother wanted her checked for flesh eaating bacteria...swelling of the ankles patient got concerned. She has tried nothing on the bites.  Blood pressure 121/82, pulse 75, temperature 97.8 F (36.6 C), temperature source Tympanic, SpO2 99 %. Review of Systems  Constitutional: Negative for chills and fever.  Respiratory: Negative for cough and shortness of breath.   Cardiovascular: Negative for chest pain.  Skin: Positive for rash.  Neurological: Negative for dizziness, syncope and light-headedness.  Psychiatric/Behavioral: Negative for behavioral problems, self-injury and suicidal ideas.       Objective:   Physical Exam  Constitutional: She is oriented to person, place, and time. She appears well-developed and well-nourished.  HENT:  Head: Normocephalic and atraumatic.  Eyes: Pupils are equal, round, and reactive to light. Conjunctivae and EOM are normal.  Neurological: She is alert and oriented to person, place, and time.  Skin: Skin is warm and dry. Capillary refill takes less than 2 seconds. Rash noted.  Psychiatric: She has a normal mood and affect. Her behavior is normal. Judgment and thought content normal.  Nursing note reviewed.     On chest forearms few tiny maculopapular area. On upper arms and fore arms.    Assessment & Plan:   Rash, bug bites Calagel and hydrocortizone cream sample packets given from clinic. Apply twice daily to affected areas  x 7 days. Lukewarm showers. Restart OTC Zyrtec take as directed for itching. Return to clinic if not improving in  3 -5 days. Reassured patient she does not have a flesh eating bacteria. Patient verbalizes understanding and has no questions at discharge.

## 2018-01-14 NOTE — Patient Instructions (Addendum)
Rash A rash is a change in the color of the skin. A rash can also change the way your skin feels. There are many different conditions and factors that can cause a rash. Follow these instructions at home: Pay attention to any changes in your symptoms. Follow these instructions to help with your condition: Medicine Take or apply over-the-counter and prescription medicines only as told by your doctor. These may include:  Corticosteroid cream.  Anti-itch lotions.  Oral antihistamines.  Skin Care  Put cool compresses on the affected areas.  Try taking a bath with: ? Epsom salts. Follow the instructions on the packaging. You can get these at your local pharmacy or grocery store. ? Baking soda. Pour a small amount into the bath as told by your doctor. ? Colloidal oatmeal. Follow the instructions on the packaging. You can get this at your local pharmacy or grocery store.  Try putting baking soda paste onto your skin. Stir water into baking soda until it gets like a paste.  Do not scratch or rub your skin.  Avoid covering the rash. Make sure the rash is exposed to air as much as possible. General instructions  Avoid hot showers or baths, which can make itching worse. A cold shower may help.  Avoid scented soaps, detergents, and perfumes. Use gentle soaps, detergents, perfumes, and other cosmetic products.  Avoid anything that causes your rash. Keep a journal to help track what causes your rash. Write down: ? What you eat. ? What cosmetic products you use. ? What you drink. ? What you wear. This includes jewelry.  Keep all follow-up visits as told by your doctor. This is important. Contact a doctor if:  You sweat at night.  You lose weight.  You pee (urinate) more than normal.  You feel weak.  You throw up (vomit).  Your skin or the whites of your eyes look yellow (jaundice).  Your skin: ? Tingles. ? Is numb.  Your rash: ? Does not go away after a few days. ? Gets  worse.  You are: ? More thirsty than normal. ? More tired than normal.  You have: ? New symptoms. ? Pain in your belly (abdomen). ? A fever. ? Watery poop (diarrhea). Get help right away if:  Your rash covers all or most of your body. The rash may or may not be painful.  You have blisters that: ? Are on top of the rash. ? Grow larger. ? Grow together. ? Are painful. ? Are inside your nose or mouth.  You have a rash that: ? Looks like purple pinprick-sized spots all over your body. ? Has a "bull's eye" or looks like a target. ? Is red and painful, causes your skin to peel, and is not from being in the sun too long. This information is not intended to replace advice given to you by your health care provider. Make sure you discuss any questions you have with your health care provider. Document Released: 11/14/2007 Document Revised: 11/03/2015 Document Reviewed: 10/13/2014 Elsevier Interactive Patient Education  2018 [employer]  IT sales professionalnsect Bite, Adult An insect bite can make your skin red, itchy, and swollen. Some insects can spread disease to people with a bite. However, most insect bites do not lead to disease, and most are not serious. Follow these instructions at home: Bite area care  Do not scratch the bite area.  Keep the bite area clean and dry.  Wash the bite area every day with soap and water as told  by your doctor.  Check the bite area every day for signs of infection. Check for: ? More redness, swelling, or pain. ? Fluid or blood. ? Warmth. ? Pus. Managing pain, itching, and swelling  You may put any of these on the bite area as told by your doctor: ? A baking soda paste. ? Cortisone cream. ? Calamine lotion.  If directed, put ice on the bite area. ? Put ice in a plastic bag. ? Place a towel between your skin and the bag. ? Leave the ice on for 20 minutes, 2-3 times a day. Medicines  Take medicines or put medicines on your skin only as told by your  doctor.  If you were prescribed an antibiotic medicine, use it as told by your doctor. Do not stop using the antibiotic even if your condition improves. General instructions  Keep all follow-up visits as told by your doctor. This is important. How is this prevented? To help you have a lower risk of insect bites:  When you are outside, wear clothing that covers your arms and legs.  Use insect repellent. The best insect repellents have: ? An active ingredient of DEET, picaridin, oil of lemon eucalyptus (OLE), or IR3535. ? Higher amounts of DEET or another active ingredient than other repellents have.  If your home windows do not have screens, think about putting some in.  Contact a doctor if:  You have more redness, swelling, or pain in the bite area.  You have fluid, blood, or pus coming from the bite area.  The bite area feels warm.  You have a fever. Get help right away if:  You have joint pain.  You have a rash.  You have shortness of breath.  You feel more tired or sleepy than you normally do.  You have neck pain.  You have a headache.  You feel weaker than you normally do.  You have chest pain.  You have pain in your belly.  You feel sick to your stomach (nauseous) or you throw up (vomit). Summary  An insect bite can make your skin red, itchy, and swollen.  Do not scratch the bite area, and keep it clean and dry.  Ice can help with pain and itching from the bite. This information is not intended to replace advice given to you by your health care provider. Make sure you discuss any questions you have with your health care provider. Document Released: 05/25/2000 Document Revised: 12/29/2015 Document Reviewed: 10/13/2014 Elsevier Interactive Patient Education  2018 [employer].

## 2018-02-03 ENCOUNTER — Other Ambulatory Visit: Payer: Self-pay | Admitting: Family Medicine

## 2018-02-12 ENCOUNTER — Ambulatory Visit: Payer: No Typology Code available for payment source | Admitting: Family Medicine

## 2018-02-12 ENCOUNTER — Encounter: Payer: Self-pay | Admitting: Family Medicine

## 2018-02-12 VITALS — BP 124/82 | HR 76 | Temp 98.6°F | Ht 63.25 in | Wt 107.5 lb

## 2018-02-12 DIAGNOSIS — Z113 Encounter for screening for infections with a predominantly sexual mode of transmission: Secondary | ICD-10-CM | POA: Insufficient documentation

## 2018-02-12 DIAGNOSIS — F3281 Premenstrual dysphoric disorder: Secondary | ICD-10-CM | POA: Diagnosis not present

## 2018-02-12 DIAGNOSIS — K644 Residual hemorrhoidal skin tags: Secondary | ICD-10-CM

## 2018-02-12 DIAGNOSIS — Z23 Encounter for immunization: Secondary | ICD-10-CM

## 2018-02-12 DIAGNOSIS — J301 Allergic rhinitis due to pollen: Secondary | ICD-10-CM

## 2018-02-12 DIAGNOSIS — F329 Major depressive disorder, single episode, unspecified: Secondary | ICD-10-CM | POA: Diagnosis not present

## 2018-02-12 DIAGNOSIS — J309 Allergic rhinitis, unspecified: Secondary | ICD-10-CM | POA: Insufficient documentation

## 2018-02-12 MED ORDER — LEVONORGESTREL-ETHINYL ESTRAD 0.1-20 MG-MCG PO TABS: 1.0000 | ORAL_TABLET | Freq: Every day | ORAL | 11 refills | Status: DC

## 2018-02-12 MED ORDER — BUSPIRONE HCL 15 MG PO TABS: 7.5000 mg | ORAL_TABLET | Freq: Two times a day (BID) | ORAL | 5 refills | Status: DC | PRN

## 2018-02-12 MED ORDER — HYDROCORTISONE 2.5 % RE CREA: 1.0000 "application " | TOPICAL_CREAM | Freq: Two times a day (BID) | RECTAL | 1 refills | Status: DC | PRN

## 2018-02-12 NOTE — Assessment & Plan Note (Signed)
Pollen/seasonal Continue flonase and zyrtec prn

## 2018-02-12 NOTE — Assessment & Plan Note (Signed)
Worsened anxiety/irritability around time of menses  Already on zoloft 100 mg  Will try buspar 7.5 mg bid prn around menses /for anxiety  Discussed expectations of this medication including time to effectiveness and mechanism of action, also poss of side effects (early and late)- including mental fuzziness, weight or appetite change, nausea and poss of worse dep or anxiety (even suicidal thoughts)  Pt voiced understanding and will stop med and update if this occurs  Plans to continue counseling Also begin OC in hopes this will also help

## 2018-02-12 NOTE — Patient Instructions (Addendum)
Avoid lifting /straining for your hemorrhoids Try the anusol hc cream for 5-10 days as needed   Let's try buspar for anxiety as needed around period If any side effects- alert me   Std screening labs today   Start the oral contraceptive the first Sunday after period starts (start on Sunday if period starts on Sunday)  If any intolerable side effects- let me know  Take at the same time every day  This should help period and mood

## 2018-02-12 NOTE — Assessment & Plan Note (Signed)
Currently on zoloft and seeing counselor More anxiety lately with divorce as well  Continues zoloft prn Reviewed stressors/ coping techniques/symptoms/ support sources/ tx options and side effects in detail today  Enc her to continue counseling and good coping skills incl yoga and self care  Disc addition of buspar prn for anxiety/PMDD symptoms as well
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
## 2018-02-12 NOTE — Assessment & Plan Note (Signed)
Per pt -ongoing problem not resp to otc care  Recommend continue fiber or stool softeners to prev straining  Also avoid heavy lifting if flared  Px for anusol hc cream to use qd-bid prn (not more than 10 d at a time) F/u if no improvement

## 2018-02-12 NOTE — Assessment & Plan Note (Signed)
Pt desires STD screening Labs for RPR/HIV/gc/chlamydia today  No symptoms

## 2018-02-13 LAB — HIV ANTIBODY (ROUTINE TESTING W REFLEX): HIV 1&2 Ab, 4th Generation: NONREACTIVE

## 2018-02-13 LAB — C. TRACHOMATIS/N. GONORRHOEAE RNA
C. trachomatis RNA, TMA: NOT DETECTED
N. gonorrhoeae RNA, TMA: NOT DETECTED

## 2018-02-13 LAB — RPR: RPR: NONREACTIVE

## 2018-03-06 ENCOUNTER — Other Ambulatory Visit: Payer: Self-pay | Admitting: Family Medicine

## 2018-03-24 ENCOUNTER — Telehealth: Payer: Self-pay | Admitting: *Deleted

## 2018-03-24 DIAGNOSIS — R928 Other abnormal and inconclusive findings on diagnostic imaging of breast: Secondary | ICD-10-CM | POA: Insufficient documentation

## 2018-03-24 NOTE — Telephone Encounter (Signed)
I don't think I can order it until I have the report-please send for it (mammogram report) Unsure if she needs [place] as well  If the breast center can put orders in epic I can sign them now -just let me know  Thanks

## 2018-03-24 NOTE — Telephone Encounter (Signed)
Copy of original mammogram placed in Dr. Royden Purl inbox, message sent back to Dr. Milinda Antis since it would be a diagnostic mammogram she would have to put orders in

## 2018-03-24 NOTE — Telephone Encounter (Signed)
Copied from CRM [id number]. Topic: Referral - Request for Referral >> Mar 24, 2018 11:58 AM Crist Infante wrote: Has patient seen PCP for this complaint? no Referral for which specialty: diagnostic mammogram Preferred provider/office: Breast center Reason for referral: pt got a letter from [facility] where she had a mobile unit come through her employer and did a mammogram. She received a letter stating she needs additional testing. Pt not sure if Dr Milinda Antis has received this info, but she tried to schedule this am and the Breast center advised her to call her dr Pt states you can call them to get the info if you do not have [facility] mobile Phone:  [phone]

## 2018-03-24 NOTE — Telephone Encounter (Signed)
Order done Will route to [facility] 

## 2018-03-25 NOTE — Telephone Encounter (Signed)
Order done

## 2018-03-25 NOTE — Addendum Note (Signed)
Addended by: Roxy Manns A on: 03/25/2018 01:02 PM   Modules accepted: Orders

## 2018-03-25 NOTE — Telephone Encounter (Signed)
Called the patient,apparently there was confusion over where she wanted to go for the additional views and [place]. She wants to go to the [facility] not [facility]. Spoke with the [facility] [place] and they have called the patient and have left her a message to call them back. They will need to get her films from [facility], they will set her up there.

## 2018-03-25 NOTE — Telephone Encounter (Signed)
Called [facility] and you will need to place a Uni Left limited Breast US and choose external as the location. Terri changed the [facility] order to external for you. Please place the [place] order.

## 2018-04-20 ENCOUNTER — Telehealth: Payer: Self-pay | Admitting: Family Medicine

## 2018-04-20 DIAGNOSIS — Z0000 Encounter for general adult medical examination without abnormal findings: Secondary | ICD-10-CM

## 2018-04-20 NOTE — Telephone Encounter (Signed)
-----   Message from Wendi Maya, RT sent at 04/15/2018  9:27 AM EST ----- Regarding: Lab orders for Wednesday 04/23/18 Please enter CPE lab orders for 04/23/18. Thanks!

## 2018-04-21 ENCOUNTER — Ambulatory Visit: Payer: Self-pay | Admitting: *Deleted

## 2018-04-21 DIAGNOSIS — Z0000 Encounter for general adult medical examination without abnormal findings: Secondary | ICD-10-CM

## 2018-04-21 NOTE — Progress Notes (Signed)
Labs okayed per [employer] NP VO prior to upcoming pcp annual exam.

## 2018-04-22 LAB — CMP12+LP+TP+TSH+6AC+CBC/D/PLT
ALBUMIN: 4.5 g/dL (ref 3.5–5.5)
ALT: 13 IU/L (ref 0–32)
AST: 20 IU/L (ref 0–40)
Albumin/Globulin Ratio: 1.8 (ref 1.2–2.2)
Alkaline Phosphatase: 33 IU/L — ABNORMAL LOW (ref 39–117)
BUN/Creatinine Ratio: 14 (ref 9–23)
BUN: 13 mg/dL (ref 6–24)
Basophils Absolute: 0.1 10*3/uL (ref 0.0–0.2)
Basos: 1 %
Bilirubin Total: <0.2 mg/dL (ref 0.0–1.2)
CALCIUM: 8.5 mg/dL — AB (ref 8.7–10.2)
CHOLESTEROL TOTAL: 204 mg/dL — AB (ref 100–199)
Chloride: 107 mmol/L — ABNORMAL HIGH (ref 96–106)
Chol/HDL Ratio: 2.8 ratio (ref 0.0–4.4)
Creatinine, Ser: 0.94 mg/dL (ref 0.57–1.00)
EOS (ABSOLUTE): 0.2 10*3/uL (ref 0.0–0.4)
Eos: 3 %
Estimated CHD Risk: <0.5 times avg. (ref 0.0–1.0)
FREE THYROXINE INDEX: 1.4 (ref 1.2–4.9)
GFR calc Af Amer: 84 mL/min/{1.73_m2} (ref >59)
GFR calc non Af Amer: 73 mL/min/{1.73_m2} (ref >59)
GGT: 8 IU/L (ref 0–60)
GLOBULIN, TOTAL: 2.5 g/dL (ref 1.5–4.5)
Glucose: 106 mg/dL — ABNORMAL HIGH (ref 65–99)
HDL: 73 mg/dL (ref >39)
Hematocrit: 34.2 % (ref 34.0–46.6)
Hemoglobin: 10.8 g/dL — ABNORMAL LOW (ref 11.1–15.9)
IRON: 25 ug/dL — AB (ref 27–159)
Immature Grans (Abs): 0 10*3/uL (ref 0.0–0.1)
Immature Granulocytes: 0 %
LDH: 192 IU/L (ref 119–226)
LDL Calculated: 118 mg/dL — ABNORMAL HIGH (ref 0–99)
Lymphocytes Absolute: 1.2 10*3/uL (ref 0.7–3.1)
Lymphs: 19 %
MCH: 26.5 pg — ABNORMAL LOW (ref 26.6–33.0)
MCHC: 31.6 g/dL (ref 31.5–35.7)
MCV: 84 fL (ref 79–97)
MONOS ABS: 0.2 10*3/uL (ref 0.1–0.9)
Monocytes: 4 %
NEUTROS ABS: 4.5 10*3/uL (ref 1.4–7.0)
NEUTROS PCT: 73 %
PHOSPHORUS: 2.8 mg/dL (ref 2.5–4.5)
POTASSIUM: 4.5 mmol/L (ref 3.5–5.2)
Platelets: 367 10*3/uL (ref 150–450)
RBC: 4.07 x10E6/uL (ref 3.77–5.28)
RDW: 14 % (ref 12.3–15.4)
Sodium: 142 mmol/L (ref 134–144)
T3 UPTAKE RATIO: 20 % — AB (ref 24–39)
T4 TOTAL: 7.1 ug/dL (ref 4.5–12.0)
TSH: 2.22 u[IU]/mL (ref 0.450–4.500)
Total Protein: 7 g/dL (ref 6.0–8.5)
Triglycerides: 67 mg/dL (ref 0–149)
Uric Acid: 3 mg/dL (ref 2.5–7.1)
VLDL Cholesterol Cal: 13 mg/dL (ref 5–40)
WBC: 6.1 10*3/uL (ref 3.4–10.8)

## 2018-04-22 LAB — HGB A1C W/O EAG: Hgb A1c MFr Bld: 5.7 % — ABNORMAL HIGH (ref 4.8–5.6)
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
## 2018-04-23 ENCOUNTER — Other Ambulatory Visit: Payer: Self-pay

## 2018-04-28 ENCOUNTER — Ambulatory Visit: Payer: Self-pay

## 2018-04-28 ENCOUNTER — Ambulatory Visit
Admission: RE | Admit: 2018-04-28 | Discharge: 2018-04-28 | Disposition: A | Discharge status: Home / Self Care | Payer: PRIVATE HEALTH INSURANCE | Source: Ambulatory Visit | Attending: Family Medicine | Admitting: Family Medicine

## 2018-04-28 DIAGNOSIS — R928 Other abnormal and inconclusive findings on diagnostic imaging of breast: Secondary | ICD-10-CM

## 2018-05-01 ENCOUNTER — Encounter: Payer: Self-pay | Admitting: Family Medicine

## 2018-05-05 ENCOUNTER — Encounter: Payer: Self-pay | Admitting: Family Medicine

## 2018-05-05 ENCOUNTER — Ambulatory Visit (INDEPENDENT_AMBULATORY_CARE_PROVIDER_SITE_OTHER): Payer: PRIVATE HEALTH INSURANCE | Admitting: Family Medicine

## 2018-05-05 ENCOUNTER — Other Ambulatory Visit (HOSPITAL_COMMUNITY)
Admission: RE | Admit: 2018-05-05 | Discharge: 2018-05-05 | Disposition: A | Discharge status: Home / Self Care | Payer: PRIVATE HEALTH INSURANCE | Source: Ambulatory Visit | Attending: Family Medicine | Admitting: Family Medicine

## 2018-05-05 VITALS — BP 130/70 | HR 76 | Temp 98.2°F | Ht 63.5 in | Wt 113.0 lb

## 2018-05-05 DIAGNOSIS — D509 Iron deficiency anemia, unspecified: Secondary | ICD-10-CM | POA: Insufficient documentation

## 2018-05-05 DIAGNOSIS — F329 Major depressive disorder, single episode, unspecified: Secondary | ICD-10-CM

## 2018-05-05 DIAGNOSIS — Z0000 Encounter for general adult medical examination without abnormal findings: Secondary | ICD-10-CM | POA: Diagnosis not present

## 2018-05-05 DIAGNOSIS — Z01419 Encounter for gynecological examination (general) (routine) without abnormal findings: Secondary | ICD-10-CM | POA: Insufficient documentation

## 2018-05-05 DIAGNOSIS — D500 Iron deficiency anemia secondary to blood loss (chronic): Secondary | ICD-10-CM

## 2018-05-05 DIAGNOSIS — F3281 Premenstrual dysphoric disorder: Secondary | ICD-10-CM

## 2018-05-05 DIAGNOSIS — R7303 Prediabetes: Secondary | ICD-10-CM | POA: Insufficient documentation

## 2018-05-05 MED ORDER — LEVONORGESTREL-ETHINYL ESTRAD 0.1-20 MG-MCG PO TABS: 1.0000 | ORAL_TABLET | Freq: Every day | ORAL | 11 refills | Status: DC

## 2018-05-05 MED ORDER — SERTRALINE HCL 100 MG PO TABS: ORAL_TABLET | ORAL | 3 refills | Status: DC

## 2018-05-05 NOTE — Progress Notes (Signed)
Subjective:    Patient ID: Kara [name], female    DOB: [birth year], 47 y.o.   MRN: [id number]  HPI  Here for health maintenance exam and to review chronic medical problems    Doing well   Wt Readings from Last 3 Encounters:  05/05/18 113 lb (51.3 kg)  02/12/18 107 lb 8 oz (48.8 kg)  12/03/16 112 lb 8 oz (51 kg)  eating fairly - needs to eat better  Hard to get enough protein in  19.70 kg/m  Pap 6/17 (neg with neg HPV)  Menses Taking lessina  OC -just started (2 months)  Is sexually active right now  Bled (spotting) for entire first month Period the first month was heavy 2nd month- period was more normal  This month it was very short and lighter (no spotting)   Some menopausal symptoms- hot flashes  Some night sweats   Wants to do STD screening -gc and chlamydia  HIV and RPR nl recent -does not want to repeat them   Mammogram 11/19 - nl (had callback to re image nipple) with implants  Diagnostic mammogram nl    Tetanus shot 2/14  Flu shot 9/19   HIV screen 9/19   Mood/ depression - better overall   Wellness labs at work  Glucose 106 fasting  Lab Results  Component Value Date   HGBA1C 5.7 (H) 04/21/2018    Total cholesterol 204 HDL 73  LDL 118  Trig 67   Iron 25 -low  Hb of 10.8   Lab Results  Component Value Date   WBC 6.1 04/21/2018   HGB 10.8 (L) 04/21/2018   HCT 34.2 04/21/2018   MCV 84 04/21/2018   PLT 367 04/21/2018    Patient Active Problem List   Diagnosis Date Noted  . Encounter for annual routine gynecological examination 05/05/2018  . Prediabetes 05/05/2018  . Iron deficiency anemia 05/05/2018  . Abnormal mammogram of left breast 03/24/2018  . PMDD (premenstrual dysphoric disorder) 02/12/2018  . Screen for STD (sexually transmitted disease) 02/12/2018  . Allergic rhinitis 02/12/2018  . External hemorrhoids 02/12/2018  . Routine general medical examination at a health care facility 07/15/2012  . Depression 04/09/2011   Past  Medical History:  Diagnosis Date  . Depression   . Mitral valve prolapse    History reviewed. No pertinent surgical history. Social History   Tobacco Use  . Smoking status: Never Smoker  . Smokeless tobacco: Never Used  Substance Use Topics  . Alcohol use: Yes    Alcohol/week: 0.0 standard drinks    Comment: occasional  . Drug use: Never   Family History  Problem Relation Age of Onset  . Cancer Maternal Aunt 55       breast   Allergies  Allergen Reactions  . Sulfa Antibiotics Shortness Of Breath   Current Outpatient Medications on File Prior to Visit  Medication Sig Dispense Refill  . busPIRone (BUSPAR) 15 MG tablet Take 0.5 tablets (7.5 mg total) by mouth 2 (two) times daily as needed. 30 tablet 5  . cetirizine (ZYRTEC) 10 MG tablet Take by mouth daily as needed     . diphenhydrAMINE (BENADRYL ALLERGY) 25 MG tablet Take by mouth as directed as needed.     . hydrocortisone (ANUSOL-HC) 2.5 % rectal cream Place 1 application rectally 2 (two) times daily as needed for hemorrhoids or anal itching. 30 g 1   No current facility-administered medications on file prior to visit.     Review of Systems  Objective:   Physical Exam  Constitutional: She appears well-developed and well-nourished. No distress.  Slim and well appearing   HENT:  Head: Normocephalic and atraumatic.  Right Ear: External ear normal.  Left Ear: External ear normal.  Mouth/Throat: Oropharynx is clear and moist.  Eyes: Pupils are equal, round, and reactive to light. Conjunctivae and EOM are normal. No scleral icterus.  Neck: Normal range of motion. Neck supple. No JVD present. Carotid bruit is not present. No thyromegaly present.  Cardiovascular: Normal rate, regular rhythm, normal heart sounds and intact distal pulses. Exam reveals no gallop.  Pulmonary/Chest: Effort normal and breath sounds normal. No respiratory distress. She has no wheezes. She has no rales. She exhibits no tenderness. No breast  tenderness, discharge or bleeding.  Abdominal: Soft. Bowel sounds are normal. She exhibits no distension, no abdominal bruit and no mass. There is no tenderness.  Genitourinary:  Genitourinary Comments: Breast implants palp bilat - R one is softer than L  Small area of scar tissue (vs LN) L axilla -not tender  Breast exam: No mass, nodules, thickening, tenderness, bulging, retraction, inflamation, nipple discharge or skin changes noted.  No axillary or clavicular LA.              Anus appears normal w/o hemorrhoids or masses     External genitalia : nl appearance and hair distribution/no lesions     Urethral meatus : nl size, no lesions or prolapse     Urethra: no masses, tenderness or scarring    Bladder : no masses or tenderness     Vagina: nl general appearance, or  Lesions, no significant cystocele  or rectocele thick white vaginal d/c noted     Cervix: no lesions/ discharge or friability    Uterus: nl size, contour, position, and mobility (not fixed) , non tender    Adnexa : no masses, tenderness, enlargement or nodularity        Musculoskeletal: Normal range of motion. She exhibits no edema or tenderness.  Lymphadenopathy:    She has no cervical adenopathy.  Neurological: She is alert. She has normal reflexes. She displays normal reflexes. No cranial nerve deficit. She exhibits normal muscle tone. Coordination normal.  Skin: Skin is warm and dry. No rash noted. No erythema. No pallor.  Solar lentigines diffusely   Psychiatric: She has a normal mood and affect.  Pleasant Mood is good           Assessment & Plan:   Problem List Items Addressed This Visit      Other   Routine general medical examination at a health care facility - Primary    Reviewed health habits including diet and exercise and skin cancer prevention Reviewed appropriate screening tests for age  Also reviewed health mt list, fam hx and immunization status , as well as social and  family history   See HPI Labs reviewed  Gyn exam and pap done  Disc imp of self care       Prediabetes    Lab Results  Component Value Date   HGBA1C 5.7 (H) 04/21/2018   Disc way to enc less processed carbs and still inc healthy fats (like avacado) to mt weight       PMDD (premenstrual dysphoric disorder)    Doing well with OC and zoloft       Relevant Medications   sertraline (ZOLOFT) 100 MG tablet   Iron deficiency anemia    Suspect this was from heavy menses  Now improved  Enc iron rich diet  Expect this to improve with OC       Encounter for annual routine gynecological examination    Routine exam with pap done Possible yeast vaginitis - pending pap report / pt plans to use otc tx  Continue current OC-now adj to it  GC/chlamydia screen today  No symptoms Enc use of condoms        Relevant Orders   Cytology - PAP   Depression    Doing better with zoloft 100 mg daily and prn buspar  Reviewed stressors/ coping techniques/symptoms/ support sources/ tx options and side effects in detail today Self care is good  Enc exercise and outdoor time       Relevant Medications   sertraline (ZOLOFT) 100 MG tablet

## 2018-05-05 NOTE — Assessment & Plan Note (Signed)
Doing well with OC and zoloft

## 2018-05-05 NOTE — Assessment & Plan Note (Signed)
Suspect this was from heavy menses  Now improved  Enc iron rich diet  Expect this to improve with OC

## 2018-05-05 NOTE — Patient Instructions (Addendum)
Pap/gyn exam today   Eat a healthy diet  Avoid red meat/ fried foods/ egg yolks/ fatty breakfast meats/ butter, cheese and high fat dairy/ and shellfish   Try to get most of your carbohydrates from produce (with the exception of white potatoes)  Eat less bread/pasta/rice/snack foods/cereals/sweets and other items from the middle of the grocery store (processed carbs)  More lean protein is a good idea Also healthy fats (like avocado)

## 2018-05-05 NOTE — Assessment & Plan Note (Signed)
Routine exam with pap done Possible yeast vaginitis - pending pap report / pt plans to use otc tx  Continue current OC-now adj to it  GC/chlamydia screen today  No symptoms Enc use of condoms

## 2018-05-05 NOTE — Assessment & Plan Note (Signed)
Lab Results  Component Value Date   HGBA1C 5.7 (H) 04/21/2018   Disc way to enc less processed carbs and still inc healthy fats (like avacado) to mt weight

## 2018-05-05 NOTE — Assessment & Plan Note (Signed)
Reviewed health habits including diet and exercise and skin cancer prevention Reviewed appropriate screening tests for age  Also reviewed health mt list, fam hx and immunization status , as well as social and family history   See HPI Labs reviewed  Gyn exam and pap done  Disc imp of self care

## 2018-05-05 NOTE — Assessment & Plan Note (Signed)
Doing better with zoloft 100 mg daily and prn buspar  Reviewed stressors/ coping techniques/symptoms/ support sources/ tx options and side effects in detail today Self care is good  Enc exercise and outdoor time

## 2018-05-06 LAB — CYTOLOGY - PAP
Chlamydia: NEGATIVE
Diagnosis: NEGATIVE
HPV (WINDOPATH): DETECTED — AB
HPV 16/18/45 GENOTYPING: POSITIVE — AB
Neisseria Gonorrhea: NEGATIVE

## 2018-05-07 ENCOUNTER — Other Ambulatory Visit: Payer: Self-pay | Admitting: *Deleted

## 2018-05-07 NOTE — Telephone Encounter (Signed)
Left VM requesting pt to call the office back 

## 2018-05-07 NOTE — Telephone Encounter (Signed)
-----   Message from Judy PimpleMarne A Tower, MD sent at 05/07/2018  8:10 AM EST ----- Yeast on pap Please send in diflucan 150 mg one time po #1 no refills

## 2018-05-12 ENCOUNTER — Telehealth: Payer: Self-pay | Admitting: Family Medicine

## 2018-05-12 DIAGNOSIS — B977 Papillomavirus as the cause of diseases classified elsewhere: Secondary | ICD-10-CM

## 2018-05-12 MED ORDER — FLUCONAZOLE 150 MG PO TABS: 150.0000 mg | ORAL_TABLET | Freq: Once | ORAL | 0 refills | Status: AC

## 2018-05-12 NOTE — Telephone Encounter (Signed)
Rx sent 

## 2018-05-12 NOTE — Telephone Encounter (Signed)
-----   Message from Judy PimpleMarne A Tower, MD sent at 05/10/2018  1:19 PM EST ----- Pap test is negative but HPV is positive for the high risk type (could put her at risk of cervical cancer)  For this reason I want to refer her to gyn for further evaluation  Please ask if agreeable and see if she has a pref practice GC and chlamydia are negative  There is a yeast infection  Please send in diflucan 150 mg 1 po times one #1 no ref

## 2018-05-12 NOTE — Telephone Encounter (Signed)
Ref done  Will route to [facility] 

## 2018-05-12 NOTE — Telephone Encounter (Signed)
-----   Message from Shon MilletShapale M [place], [place] sent at 05/12/2018  4:48 PM EST ----- Rx sent to pharmacy. Pt notified of pap smear results and Dr. Royden Purlower's comments pt agrees with referral to GYN please put referral in and [facility]

## 2018-05-16 NOTE — Telephone Encounter (Signed)
Appt made and patient is aware. °

## 2018-06-11 ENCOUNTER — Other Ambulatory Visit: Payer: Self-pay | Admitting: Family Medicine

## 2018-07-18 DIAGNOSIS — D649 Anemia, unspecified: Secondary | ICD-10-CM | POA: Insufficient documentation

## 2018-07-18 DIAGNOSIS — D069 Carcinoma in situ of cervix, unspecified: Secondary | ICD-10-CM | POA: Insufficient documentation

## 2018-07-18 DIAGNOSIS — E119 Type 2 diabetes mellitus without complications: Secondary | ICD-10-CM | POA: Insufficient documentation

## 2018-07-22 ENCOUNTER — Encounter: Payer: Self-pay | Admitting: Registered Nurse

## 2018-07-22 ENCOUNTER — Ambulatory Visit: Payer: PRIVATE HEALTH INSURANCE | Admitting: Registered Nurse

## 2018-07-22 VITALS — HR 73 | Temp 98.3°F | Resp 16

## 2018-07-22 DIAGNOSIS — J019 Acute sinusitis, unspecified: Secondary | ICD-10-CM

## 2018-07-22 MED ORDER — FLUTICASONE PROPIONATE 50 MCG/ACT NA SUSP: 2.0000 | Freq: Every day | NASAL | 6 refills | Status: DC

## 2018-07-22 MED ORDER — CETIRIZINE HCL 10 MG PO TABS: 10.0000 mg | ORAL_TABLET | Freq: Every day | ORAL | 11 refills | Status: DC

## 2018-07-22 MED ORDER — PHENYLEPHRINE HCL 10 MG PO TABS: 10.0000 mg | ORAL_TABLET | Freq: Four times a day (QID) | ORAL | Status: AC | PRN

## 2018-07-22 MED ORDER — SALINE SPRAY 0.65 % NA SOLN: 2.0000 | NASAL | 0 refills | Status: DC

## 2018-07-22 NOTE — Progress Notes (Signed)
.   Subjective:    Patient ID: Kara [name], female    DOB: 1970-11-25, 48 y.o.   MRN: [id number]  47y/o Caucasian female pt c/o R frontal sinus pain/pressure, rhinorrhea, pressure behind R eye since yesterday. RN saw pt yesterday and advised phenylephrine, nasal saline spray and Flonase. Pt reports today she feels much better, even to the point that she did not take any meds this morning + sick contacts at work viral URI and PMHx spring allergic rhinitis  Phenylephrine makes her drowsy as does benadryl     Review of Systems  Constitutional: Negative for activity change, appetite change, chills, diaphoresis, fatigue, fever and unexpected weight change.  HENT: Positive for congestion, ear pain, postnasal drip, rhinorrhea, sinus pressure, sinus pain and sore throat. Negative for dental problem, drooling, ear discharge, facial swelling, hearing loss, mouth sores, nosebleeds, sneezing, tinnitus, trouble swallowing and voice change.   Eyes: Negative for photophobia, pain, discharge, redness, itching and visual disturbance.  Respiratory: Negative for cough, choking, chest tightness, shortness of breath, wheezing and stridor.   Cardiovascular: Negative for chest pain, palpitations and leg swelling.  Gastrointestinal: Negative for abdominal distention, abdominal pain, blood in stool, constipation, diarrhea, nausea and vomiting.  Endocrine: Negative for cold intolerance and heat intolerance.  Genitourinary: Negative for difficulty urinating, dysuria and hematuria.  Musculoskeletal: Negative for arthralgias, back pain, gait problem, joint swelling, myalgias, neck pain and neck stiffness.  Skin: Negative for color change, pallor, rash and wound.  Allergic/Immunologic: Positive for environmental allergies. Negative for food allergies.  Neurological: Positive for headaches. Negative for dizziness, tremors, seizures, syncope, facial asymmetry, speech difficulty, weakness, light-headedness and numbness.   Hematological: Negative for adenopathy. Does not bruise/bleed easily.  Psychiatric/Behavioral: Negative for agitation, behavioral problems, confusion and sleep disturbance.       Objective:   Physical Exam Vitals signs and nursing note reviewed.  Constitutional:      General: She is awake. She is not in acute distress.    Appearance: Normal appearance. She is well-developed, well-groomed and normal weight. She is not ill-appearing, toxic-appearing or diaphoretic.  HENT:     Head: Normocephalic and atraumatic.     Jaw: There is normal jaw occlusion. No trismus.     Salivary Glands: Right salivary gland is not diffusely enlarged or tender. Left salivary gland is not diffusely enlarged or tender.     Right Ear: Hearing, ear canal and external ear normal. A middle ear effusion is present. There is no impacted cerumen.     Left Ear: Hearing, ear canal and external ear normal. A middle ear effusion is present. There is no impacted cerumen.     Nose: Mucosal edema, congestion and rhinorrhea present. No nasal deformity, septal deviation or laceration.     Right Turbinates: Enlarged and swollen. Not pale.     Left Turbinates: Enlarged and swollen. Not pale.     Right Sinus: No maxillary sinus tenderness or frontal sinus tenderness.     Left Sinus: No maxillary sinus tenderness or frontal sinus tenderness.     Mouth/Throat:     Lips: Pink. No lesions.     Mouth: Mucous membranes are moist. Mucous membranes are not pale, not dry and not cyanotic. No injury, lacerations, oral lesions or angioedema.     Dentition: Normal dentition. Does not have dentures. No dental caries or dental abscesses.     Tongue: No lesions.     Pharynx: Uvula midline. Pharyngeal swelling and posterior oropharyngeal erythema present. No oropharyngeal exudate or  uvula swelling.     Tonsils: No tonsillar exudate or tonsillar abscesses. Swelling: 0 on the right. 0 on the left.  Eyes:     General: Lids are normal. Allergic  shiner present. No visual field deficit or scleral icterus.       Right eye: No foreign body, discharge or hordeolum.        Left eye: No foreign body, discharge or hordeolum.     Extraocular Movements: Extraocular movements intact.     Right eye: Normal extraocular motion and no nystagmus.     Left eye: Normal extraocular motion and no nystagmus.     Conjunctiva/sclera: Conjunctivae normal.     Right eye: Right conjunctiva is not injected. No chemosis, exudate or hemorrhage.    Left eye: Left conjunctiva is not injected. No chemosis, exudate or hemorrhage.    Pupils: Pupils are equal, round, and reactive to light. Pupils are equal.     Right eye: Pupil is round and reactive.     Left eye: Pupil is round and reactive.  Neck:     Musculoskeletal: Normal range of motion and neck supple. Normal range of motion. No edema, erythema, neck rigidity, spinous process tenderness or muscular tenderness.     Thyroid: No thyroid mass or thyromegaly.     Trachea: Trachea and phonation normal. No tracheal tenderness or tracheal deviation.  Cardiovascular:     Rate and Rhythm: Normal rate and regular rhythm.     Chest Wall: PMI is not displaced.     Heart sounds: Normal heart sounds, S1 normal and S2 normal. No murmur. No friction rub. No gallop.   Pulmonary:     Effort: Pulmonary effort is normal. No accessory muscle usage or respiratory distress.     Breath sounds: Normal breath sounds and air entry. No stridor, decreased air movement or transmitted upper airway sounds. No decreased breath sounds, wheezing, rhonchi or rales.  Chest:     Chest wall: No tenderness.  Abdominal:     General: There is no distension.     Palpations: Abdomen is soft.  Musculoskeletal: Normal range of motion.        General: No tenderness.     Right shoulder: Normal.     Left shoulder: Normal.     Right elbow: Normal.    Left elbow: Normal.     Right hip: Normal.     Left hip: Normal.     Right knee: Normal.     Left  knee: Normal.     Cervical back: Normal.     Thoracic back: Normal.     Lumbar back: Normal.     Right hand: Normal.     Left hand: Normal.     Right lower leg: No edema.     Left lower leg: No edema.  Lymphadenopathy:     Head:     Right side of head: No submental, submandibular, tonsillar, preauricular, posterior auricular or occipital adenopathy.     Left side of head: No submental, submandibular, tonsillar, preauricular, posterior auricular or occipital adenopathy.     Cervical: No cervical adenopathy.     Right cervical: No superficial, deep or posterior cervical adenopathy.    Left cervical: No superficial, deep or posterior cervical adenopathy.  Skin:    General: Skin is warm and dry.     Capillary Refill: Capillary refill takes less than 2 seconds.     Coloration: Skin is not ashen, cyanotic, jaundiced, mottled, pale or sallow.  Findings: No abrasion, abscess, acne, bruising, burn, ecchymosis, erythema, signs of injury, laceration, lesion, petechiae or rash.     Nails: There is no clubbing.   Neurological:     General: No focal deficit present.     Mental Status: She is alert and oriented to person, place, and time. Mental status is at baseline. She is not disoriented.     GCS: GCS eye subscore is 4. GCS verbal subscore is 5. GCS motor subscore is 6.     Cranial Nerves: Cranial nerves are intact. No cranial nerve deficit, dysarthria or facial asymmetry.     Sensory: Sensation is intact. No sensory deficit.     Motor: Motor function is intact. No weakness, tremor, atrophy, abnormal muscle tone or seizure activity.     Coordination: Coordination is intact. Coordination normal.     Gait: Gait is intact. Gait normal.     Comments: Gait sure and steady in hallway; on/off exam table and in//out of chair without difficulty  Psychiatric:        Attention and Perception: Attention and perception normal.        Mood and Affect: Mood and affect normal.        Speech: Speech normal.         Behavior: Behavior normal. Behavior is cooperative.        Thought Content: Thought content normal.        Cognition and Memory: Cognition and memory normal.        Judgment: Judgment normal.       No sinus TTP; bilateral TMs air fluid level clear; bilateral allergic shiners; cobblestoning posterior pharynx; no cough observed in exam room; spoke full sentences without difficulty    Assessment & Plan:  A-acute rhinosinusitis, seasonal allergic rhinitis  P-Restart flonase/claritin/saline; shower prior to bedtime  Given Rx for use flex spending discussed mail order pharmacy costs she prefers [facility] as lower cost than mail order.  Phenylephrine making her drowsy discussed can start at night and stop during the day. Phenylephrine 10mg  po q6h prn rhinitis.  Discussed barometric pressure changes can worsen sinusitis symptoms also.  Continue allergy regimen next 60 days.  Patient may use normal saline nasal spray 2 sprays each nostril q2h wa as needed. flonase 50mcg 1 spray each nostril BID #1 RF6.  Patient denied personal or family history of ENT cancer.  OTC antihistamine of choice claritin 10mg  po daily #30 RF11 electronic Rx to her pharmacy of choice. May take claritin during day and if breakthrough postnasal drip benadryl 25-50mg  po at bedtime prn rhinitis.   Avoid triggers if possible.  Shower prior to bedtime if exposed to triggers.  If allergic dust/dust mites recommend mattress/pillow covers/encasements; washing linens, vacuuming, sweeping, dusting weekly.  Call or return to clinic as needed if these symptoms worsen or fail to improve as anticipated especially teeth pain, ear discharge, fever/chills, sinus pressure not responding to plan of care x 48 hours.   Exitcare handout on nonallergic/ allergic rhinitis, sinusitis and sinus rinse.  Patient verbalized understanding of instructions, agreed with plan of care and had no further questions at this time.  P2:  Avoidance and hand washing.

## 2018-07-22 NOTE — Patient Instructions (Signed)
Nonallergic Rhinitis Nonallergic rhinitis is a condition that causes symptoms that affect the nose, such as a runny nose and a stuffed-up nose (nasal congestion) that can make it hard to breathe through the nose. This condition is different from having an allergy (allergic rhinitis). Allergic rhinitis occurs when the body's defense system (immune system) reacts to a substance that you are allergic to (allergen), such as pollen, pet dander, mold, or dust. Nonallergic rhinitis has many similar symptoms, but it is not caused by allergens. Nonallergic rhinitis can be a short-term or long-term problem. What are the causes? This condition can be caused by many different things. Some common types of nonallergic rhinitis include: Infectious rhinitis  This is usually due to an infection in the upper respiratory tract. Vasomotor rhinitis  This is the most common type of long-term nonallergic rhinitis.  It is caused by too much blood flow through the nose, which makes the tissue inside of the nose swell.  Symptoms are often triggered by strong odors, cold air, stress, drinking alcohol, cigarette smoke, or changes in the weather. Occupational rhinitis  This type is caused by triggers in the workplace, such as chemicals, dusts, animal dander, or air pollution. Hormonal rhinitis  This type occurs in women as a result of an increase in the female hormone estrogen.  It may occur during pregnancy, puberty, and menstrual cycles.  Symptoms improve when estrogen levels drop. Drug-induced rhinitis Several drugs can cause nonallergic rhinitis, including:  Medicines that are used to treat high blood pressure, heart disease, and Parkinson disease.  Aspirin and NSAIDs.  Over-the-counter nasal decongestant sprays. These can cause a type of nonallergic rhinitis (rhinitis medicamentosa) when they are used for more than a few days. Nonallergic rhinitis with eosinophilia syndrome (NARES)  This type is caused by  having too much of a certain type of white blood cell (eosinophil). Nonallergic rhinitis can also be caused by a reaction to eating hot or spicy foods. This does not usually cause long-term symptoms. In some cases, the cause of nonallergic rhinitis is not known. What increases the risk? You are more likely to develop this condition if:  You are 73-37 years of age.  You are a woman. Women are twice as likely to have this condition. What are the signs or symptoms? Common symptoms of this condition include:  Nasal congestion.  Runny nose.  The feeling of mucus going down the back of the throat (postnasal drip).  Trouble sleeping at night and daytime sleepiness. Less common symptoms include:  Sneezing.  Coughing.  Itchy nose.  Bloodshot eyes. How is this diagnosed? This condition may be diagnosed based on:  Your symptoms and medical history.  A physical exam.  Allergy testing to rule out allergic rhinitis. You may have skin tests or blood tests. In some cases, the health care provider may take a swab of nasal secretions to look for an increased number of eosinophils. This would be done to confirm a diagnosis of NARES. How is this treated? Treatment for this condition depends on the cause. No single treatment works for everyone. Work with your health care provider to find the best treatment for you. Treatment may include:  Avoiding the things that trigger your symptoms.  Using medicines to relieve congestion, such as: ? Steroid nasal spray. There are many types. You may need to try a few to find out which one works best. ? Decongestant medicine. This may be an oral medicine or a nasal spray. These medicines are only used for  a short time.  Using medicines to relieve a runny nose. These may include antihistamine medicines or anticholinergic nasal sprays.  Surgery to remove tissue from inside the nose may be needed in severe cases if the condition has not improved after 6-12  months of medical treatment. Follow these instructions at home:  Take or use over-the-counter and prescription medicines only as told by your health care provider. Do not stop using your medicine even if you start to feel better.  Use salt-water (saline) rinses or other solutions (nasal washes or irrigations) to wash or rinse out the inside of your nose as told by your health care provider.  Do not take NSAIDs or medicines that contain aspirin if they make your symptoms worse.  Do not drink alcohol if it makes your symptoms worse.  Do not use any tobacco products, such as cigarettes, chewing tobacco, and e-cigarettes. If you need help quitting, ask your health care provider.  Avoid secondhand smoke.  Get some exercise every day. Exercise may help reduce symptoms of nonallergic rhinitis for some people. Ask your health care provider how much exercise and what types of exercise are safe for you.  Sleep with the head of your bed raised (elevated). This may reduce nighttime nasal congestion.  Keep all follow-up visits as told by your health care provider. This is important. Contact a health care provider if:  You have a fever.  Your symptoms are getting worse at home.  Your symptoms are not responding to medicine.  You develop new symptoms, especially a headache or nosebleed. This information is not intended to replace advice given to you by your health care provider. Make sure you discuss any questions you have with your health care provider. Document Released: 09/19/2015 Document Revised: 11/03/2015 Document Reviewed: 08/18/2015 Elsevier Interactive Patient Education  2019 [employer] Inc. Allergic Rhinitis, Adult Allergic rhinitis is an allergic reaction that affects the mucous membrane inside the nose. It causes sneezing, a runny or stuffy nose, and the feeling of mucus going down the back of the throat (postnasal drip). Allergic rhinitis can be mild to severe. There are two types of  allergic rhinitis:  Seasonal. This type is also called hay fever. It happens only during certain seasons.  Perennial. This type can happen at any time of the year. What are the causes? This condition happens when the body's defense system (immune system) responds to certain harmless substances called allergens as though they were germs.  Seasonal allergic rhinitis is triggered by pollen, which can come from grasses, trees, and weeds. Perennial allergic rhinitis may be caused by:  House dust mites.  Pet dander.  Mold spores. What are the signs or symptoms? Symptoms of this condition include:  Sneezing.  Runny or stuffy nose (nasal congestion).  Postnasal drip.  Itchy nose.  Tearing of the eyes.  Trouble sleeping.  Daytime sleepiness. How is this diagnosed? This condition may be diagnosed based on:  Your medical history.  A physical exam.  Tests to check for related conditions, such as: ? Asthma. ? Pink eye. ? Ear infection. ? Upper respiratory infection.  Tests to find out which allergens trigger your symptoms. These may include skin or blood tests. How is this treated? There is no cure for this condition, but treatment can help control symptoms. Treatment may include:  Taking medicines that block allergy symptoms, such as antihistamines. Medicine may be given as a shot, nasal spray, or pill.  Avoiding the allergen.  Desensitization. This treatment involves getting ongoing  shots until your body becomes less sensitive to the allergen. This treatment may be done if other treatments do not help.  If taking medicine and avoiding the allergen does not work, new, stronger medicines may be prescribed. Follow these instructions at home:  Find out what you are allergic to. Common allergens include smoke, dust, and pollen.  Avoid the things you are allergic to. These are some things you can do to help avoid allergens: ? Replace carpet with wood, tile, or vinyl  flooring. Carpet can trap dander and dust. ? Do not smoke. Do not allow smoking in your home. ? Change your heating and air conditioning filter at least once a month. ? During allergy season:  Keep windows closed as much as possible.  Plan outdoor activities when pollen counts are lowest. This is usually during the evening hours.  When coming indoors, change clothing and shower before sitting on furniture or bedding.  Take over-the-counter and prescription medicines only as told by your health care provider.  Keep all follow-up visits as told by your health care provider. This is important. Contact a health care provider if:  You have a fever.  You develop a persistent cough.  You make whistling sounds when you breathe (you wheeze).  Your symptoms interfere with your normal daily activities. Get help right away if:  You have shortness of breath. Summary  This condition can be managed by taking medicines as directed and avoiding allergens.  Contact your health care provider if you develop a persistent cough or fever.  During allergy season, keep windows closed as much as possible. This information is not intended to replace advice given to you by your health care provider. Make sure you discuss any questions you have with your health care provider. Document Released: 02/20/2001 Document Revised: 07/05/2016 Document Reviewed: 07/05/2016 Elsevier Interactive Patient Education  2019 Elsevier Inc. Sinusitis, Adult Sinusitis is inflammation of your sinuses. Sinuses are hollow spaces in the bones around your face. Your sinuses are located:  Around your eyes.  In the middle of your forehead.  Behind your nose.  In your cheekbones. Mucus normally drains out of your sinuses. When your nasal tissues become inflamed or swollen, mucus can become trapped or blocked. This allows bacteria, viruses, and fungi to grow, which leads to infection. Most infections of the sinuses are caused  by a virus. Sinusitis can develop quickly. It can last for up to 4 weeks (acute) or for more than 12 weeks (chronic). Sinusitis often develops after a cold. What are the causes? This condition is caused by anything that creates swelling in the sinuses or stops mucus from draining. This includes:  Allergies.  Asthma.  Infection from bacteria or viruses.  Deformities or blockages in your nose or sinuses.  Abnormal growths in the nose (nasal polyps).  Pollutants, such as chemicals or irritants in the air.  Infection from fungi (rare). What increases the risk? You are more likely to develop this condition if you:  Have a weak body defense system (immune system).  Do a lot of swimming or diving.  Overuse nasal sprays.  Smoke. What are the signs or symptoms? The main symptoms of this condition are pain and a feeling of pressure around the affected sinuses. Other symptoms include:  Stuffy nose or congestion.  Thick drainage from your nose.  Swelling and warmth over the affected sinuses.  Headache.  Upper toothache.  A cough that may get worse at night.  Extra mucus that collects in the  throat or the back of the nose (postnasal drip).  Decreased sense of smell and taste.  Fatigue.  A fever.  Sore throat.  Bad breath. How is this diagnosed? This condition is diagnosed based on:  Your symptoms.  Your medical history.  A physical exam.  Tests to find out if your condition is acute or chronic. This may include: ? Checking your nose for nasal polyps. ? Viewing your sinuses using a device that has a light (endoscope). ? Testing for allergies or bacteria. ? Imaging tests, such as an MRI or CT scan. In rare cases, a bone biopsy may be done to rule out more serious types of fungal sinus disease. How is this treated? Treatment for sinusitis depends on the cause and whether your condition is chronic or acute.  If caused by a virus, your symptoms should go away on  their own within 10 days. You may be given medicines to relieve symptoms. They include: ? Medicines that shrink swollen nasal passages (topical intranasal decongestants). ? Medicines that treat allergies (antihistamines). ? A spray that eases inflammation of the nostrils (topical intranasal corticosteroids). ? Rinses that help get rid of thick mucus in your nose (nasal saline washes).  If caused by bacteria, your health care provider may recommend waiting to see if your symptoms improve. Most bacterial infections will get better without antibiotic medicine. You may be given antibiotics if you have: ? A severe infection. ? A weak immune system.  If caused by narrow nasal passages or nasal polyps, you may need to have surgery. Follow these instructions at home: Medicines  Take, use, or apply over-the-counter and prescription medicines only as told by your health care provider. These may include nasal sprays.  If you were prescribed an antibiotic medicine, take it as told by your health care provider. Do not stop taking the antibiotic even if you start to feel better. Hydrate and humidify   Drink enough fluid to keep your urine pale yellow. Staying hydrated will help to thin your mucus.  Use a cool mist humidifier to keep the humidity level in your home above 50%.  Inhale steam for 10-15 minutes, 3-4 times a day, or as told by your health care provider. You can do this in the bathroom while a hot shower is running.  Limit your exposure to cool or dry air. Rest  Rest as much as possible.  Sleep with your head raised (elevated).  Make sure you get enough sleep each night. General instructions   Apply a warm, moist washcloth to your face 3-4 times a day or as told by your health care provider. This will help with discomfort.  Wash your hands often with soap and water to reduce your exposure to germs. If soap and water are not available, use hand sanitizer.  Do not smoke. Avoid  being around people who are smoking (secondhand smoke).  Keep all follow-up visits as told by your health care provider. This is important. Contact a health care provider if:  You have a fever.  Your symptoms get worse.  Your symptoms do not improve within 10 days. Get help right away if:  You have a severe headache.  You have persistent vomiting.  You have severe pain or swelling around your face or eyes.  You have vision problems.  You develop confusion.  Your neck is stiff.  You have trouble breathing. Summary  Sinusitis is soreness and inflammation of your sinuses. Sinuses are hollow spaces in the bones around your   face.  This condition is caused by nasal tissues that become inflamed or swollen. The swelling traps or blocks the flow of mucus. This allows bacteria, viruses, and fungi to grow, which leads to infection.  If you were prescribed an antibiotic medicine, take it as told by your health care provider. Do not stop taking the antibiotic even if you start to feel better.  Keep all follow-up visits as told by your health care provider. This is important. This information is not intended to replace advice given to you by your health care provider. Make sure you discuss any questions you have with your health care provider. Document Released: 05/28/2005 Document Revised: 10/28/2017 Document Reviewed: 10/28/2017 Elsevier Interactive Patient Education  2019 [employer] How to Perform a Sinus Rinse A sinus rinse is a home treatment that is used to rinse your sinuses with a sterile mixture of salt and water (saline solution). Sinuses are air-filled spaces in your skull behind the bones of your face and forehead that open into your nasal cavity. A sinus rinse can help to clear mucus, dirt, dust, or pollen from your nasal cavity. You may do a sinus rinse when you have a cold, a virus, nasal allergy symptoms, a sinus infection, or stuffiness in your nose or sinuses. Talk  with your health care provider about whether a sinus rinse might help you. What are the risks? A sinus rinse is generally safe and effective. However, there are a few risks, which include:  A burning sensation in your sinuses. This may happen if you do not make the saline solution as directed. Be sure to follow all directions when making the saline solution.  Nasal irritation.  Infection from contaminated water. This is rare, but possible. Do not do a sinus rinse if you have had ear or nasal surgery, ear infection, or blocked ears. Supplies needed:  Saline solution or powder.  Distilled or sterile water may be needed to mix with saline powder. ? You may use boiled and cooled tap water. Boil tap water for 5 minutes; cool until it is lukewarm. Use within 24 hours. ? Do not use regular tap water to mix with the saline solution.  Neti pot or nasal rinse bottle. These supplies release the saline solution into your nose and through your sinuses. Neti pots and nasal rinse bottles can be purchased at [facility] local [facility], a health food store, or online. How to perform a sinus rinse  1. Wash your hands with soap and water. 2. Wash your device according to the directions that came with the product and then dry it. 3. Use the solution that comes with your product or one that is sold separately in stores. Follow the mixing directions on the package if you need to mix with sterile or distilled water. 4. Fill the device with the amount of saline solution noted in the device instructions. 5. Stand over a sink and tilt your head sideways over the sink. 6. Place the spout of the device in your upper nostril (the one closer to the ceiling). 7. Gently pour or squeeze the saline solution into your nasal cavity. The liquid should drain out from the lower nostril if you are not too congested. 8. While rinsing, breathe through your open mouth. 9. Gently blow your nose to clear any mucus and rinse solution.  Blowing too hard may cause ear pain. 10. Repeat in your other nostril. 11. Clean and rinse your device with clean water and then air-dry it. Talk with  your health care provider or pharmacist if you have questions about how to do a sinus rinse. Summary  A sinus rinse is a home treatment that is used to rinse your sinuses with a sterile mixture of salt and water (saline solution).  A sinus rinse is generally safe and effective. Follow all instructions carefully.  Before doing a sinus rinse, talk with your health care provider about whether it would be helpful for you. This information is not intended to replace advice given to you by your health care provider. Make sure you discuss any questions you have with your health care provider. Document Released: 12/23/2013 Document Revised: 03/25/2017 Document Reviewed: 03/25/2017 Elsevier Interactive Patient Education  2019 [employer].

## 2019-07-15 ENCOUNTER — Telehealth: Payer: Self-pay | Admitting: Family Medicine

## 2019-07-15 ENCOUNTER — Other Ambulatory Visit: Payer: Self-pay | Admitting: Family Medicine

## 2019-07-15 DIAGNOSIS — R7303 Prediabetes: Secondary | ICD-10-CM

## 2019-07-15 DIAGNOSIS — Z113 Encounter for screening for infections with a predominantly sexual mode of transmission: Secondary | ICD-10-CM

## 2019-07-15 DIAGNOSIS — Z0000 Encounter for general adult medical examination without abnormal findings: Secondary | ICD-10-CM

## 2019-07-15 NOTE — Telephone Encounter (Signed)
Pt scheduled cpx 2/16 and labs 2/12 She would like to have std labs done can she get this done on 2/12 or does she need to talk to dr tower prior to having labs   She would like hep b and hep c labs also All STD labs   Please advise

## 2019-07-15 NOTE — Telephone Encounter (Signed)
Left VM letting pt know labs were added

## 2019-07-15 NOTE — Telephone Encounter (Signed)
I ordered labs  Included gc/chlamydia screen for urine   (this may be rejected because my epic will not let me order it) - will alert Terri  Thanks

## 2019-07-21 ENCOUNTER — Telehealth: Payer: Self-pay | Admitting: Family Medicine

## 2019-07-21 DIAGNOSIS — Z0000 Encounter for general adult medical examination without abnormal findings: Secondary | ICD-10-CM

## 2019-07-21 DIAGNOSIS — D500 Iron deficiency anemia secondary to blood loss (chronic): Secondary | ICD-10-CM

## 2019-07-21 DIAGNOSIS — R7303 Prediabetes: Secondary | ICD-10-CM

## 2019-07-21 NOTE — Telephone Encounter (Signed)
-----   Message from Aquilla Solian, RT sent at 07/15/2019  1:15 PM EST ----- Regarding: Lab Orders for Friday 2.12.2012 Please place lab orders for Friday 2.12.2012, office visit for physical on Tuesday 2.16.2021 Thank you, Jones Bales RT(R)

## 2019-07-22 ENCOUNTER — Telehealth: Payer: Self-pay

## 2019-07-22 NOTE — Telephone Encounter (Signed)
LVM w COVID screen, front door and back lab info 2.10.2021 TLJ 

## 2019-07-23 ENCOUNTER — Other Ambulatory Visit: Payer: Self-pay | Admitting: *Deleted

## 2019-07-23 MED ORDER — SERTRALINE HCL 100 MG PO TABS: 100.0000 mg | ORAL_TABLET | Freq: Every day | ORAL | 0 refills | Status: DC

## 2019-07-24 ENCOUNTER — Other Ambulatory Visit: Payer: Self-pay

## 2019-07-24 ENCOUNTER — Other Ambulatory Visit (INDEPENDENT_AMBULATORY_CARE_PROVIDER_SITE_OTHER): Payer: PRIVATE HEALTH INSURANCE

## 2019-07-24 DIAGNOSIS — Z0000 Encounter for general adult medical examination without abnormal findings: Secondary | ICD-10-CM

## 2019-07-24 DIAGNOSIS — D500 Iron deficiency anemia secondary to blood loss (chronic): Secondary | ICD-10-CM | POA: Diagnosis not present

## 2019-07-24 DIAGNOSIS — Z113 Encounter for screening for infections with a predominantly sexual mode of transmission: Secondary | ICD-10-CM

## 2019-07-24 DIAGNOSIS — R7303 Prediabetes: Secondary | ICD-10-CM | POA: Diagnosis not present

## 2019-07-24 LAB — LIPID PANEL
Cholesterol: 220 mg/dL — ABNORMAL HIGH (ref 0–200)
HDL: 72.9 mg/dL (ref >39.00)
LDL Cholesterol: 133 mg/dL — ABNORMAL HIGH (ref 0–99)
NonHDL: 147.34
Total CHOL/HDL Ratio: 3
Triglycerides: 70 mg/dL (ref 0.0–149.0)
VLDL: 14 mg/dL (ref 0.0–40.0)

## 2019-07-24 LAB — COMPREHENSIVE METABOLIC PANEL
ALT: 13 U/L (ref 0–35)
AST: 17 U/L (ref 0–37)
Albumin: 4.1 g/dL (ref 3.5–5.2)
Alkaline Phosphatase: 41 U/L (ref 39–117)
BUN: 19 mg/dL (ref 6–23)
CO2: 30 mEq/L (ref 19–32)
Calcium: 9 mg/dL (ref 8.4–10.5)
Chloride: 104 mEq/L (ref 96–112)
Creatinine, Ser: 0.82 mg/dL (ref 0.40–1.20)
GFR: 74.34 mL/min (ref >60.00)
Glucose, Bld: 86 mg/dL (ref 70–99)
Potassium: 4.7 mEq/L (ref 3.5–5.1)
Sodium: 139 mEq/L (ref 135–145)
Total Bilirubin: 0.5 mg/dL (ref 0.2–1.2)
Total Protein: 6.7 g/dL (ref 6.0–8.3)

## 2019-07-24 LAB — CBC WITH DIFFERENTIAL/PLATELET
Basophils Absolute: 0 10*3/uL (ref 0.0–0.1)
Basophils Relative: 0.6 % (ref 0.0–3.0)
Eosinophils Absolute: 0.2 10*3/uL (ref 0.0–0.7)
Eosinophils Relative: 2.8 % (ref 0.0–5.0)
HCT: 34.8 % — ABNORMAL LOW (ref 36.0–46.0)
Hemoglobin: 11.3 g/dL — ABNORMAL LOW (ref 12.0–15.0)
Lymphocytes Relative: 24.8 % (ref 12.0–46.0)
Lymphs Abs: 1.3 10*3/uL (ref 0.7–4.0)
MCHC: 32.5 g/dL (ref 30.0–36.0)
MCV: 85 fl (ref 78.0–100.0)
Monocytes Absolute: 0.3 10*3/uL (ref 0.1–1.0)
Monocytes Relative: 4.9 % (ref 3.0–12.0)
Neutro Abs: 3.6 10*3/uL (ref 1.4–7.7)
Neutrophils Relative %: 66.9 % (ref 43.0–77.0)
Platelets: 326 10*3/uL (ref 150.0–400.0)
RBC: 4.1 Mil/uL (ref 3.87–5.11)
RDW: 15.3 % (ref 11.5–15.5)
WBC: 5.4 10*3/uL (ref 4.0–10.5)

## 2019-07-24 LAB — FERRITIN: Ferritin: 5.1 ng/mL — ABNORMAL LOW (ref 10.0–291.0)

## 2019-07-24 LAB — HEMOGLOBIN A1C: Hgb A1c MFr Bld: 5.8 % (ref 4.6–6.5)

## 2019-07-24 LAB — TSH: TSH: 1.91 u[IU]/mL (ref 0.35–4.50)

## 2019-07-27 LAB — ACUTE HEP PANEL AND HEP B SURFACE AB
HEPATITIS C ANTIBODY REFILL$(REFL): NONREACTIVE
Hep A IgM: NONREACTIVE
Hep B C IgM: NONREACTIVE
Hepatitis B Surface Ag: NONREACTIVE
SIGNAL TO CUT-OFF: 0.01 (ref <1.00)

## 2019-07-27 LAB — REFLEX TIQ

## 2019-07-27 LAB — RPR: RPR Ser Ql: NONREACTIVE

## 2019-07-27 LAB — HIV ANTIBODY (ROUTINE TESTING W REFLEX): HIV 1&2 Ab, 4th Generation: NONREACTIVE

## 2019-07-28 ENCOUNTER — Encounter: Payer: Self-pay | Admitting: Family Medicine

## 2019-07-28 ENCOUNTER — Ambulatory Visit (INDEPENDENT_AMBULATORY_CARE_PROVIDER_SITE_OTHER): Payer: PRIVATE HEALTH INSURANCE | Admitting: Family Medicine

## 2019-07-28 ENCOUNTER — Other Ambulatory Visit: Payer: Self-pay

## 2019-07-28 VITALS — BP 134/86 | HR 89 | Temp 98.1°F | Ht 62.75 in | Wt 116.5 lb

## 2019-07-28 DIAGNOSIS — B977 Papillomavirus as the cause of diseases classified elsewhere: Secondary | ICD-10-CM

## 2019-07-28 DIAGNOSIS — E785 Hyperlipidemia, unspecified: Secondary | ICD-10-CM

## 2019-07-28 DIAGNOSIS — Z0000 Encounter for general adult medical examination without abnormal findings: Secondary | ICD-10-CM

## 2019-07-28 DIAGNOSIS — R7303 Prediabetes: Secondary | ICD-10-CM | POA: Diagnosis not present

## 2019-07-28 DIAGNOSIS — F329 Major depressive disorder, single episode, unspecified: Secondary | ICD-10-CM

## 2019-07-28 DIAGNOSIS — D500 Iron deficiency anemia secondary to blood loss (chronic): Secondary | ICD-10-CM | POA: Diagnosis not present

## 2019-07-28 DIAGNOSIS — Z113 Encounter for screening for infections with a predominantly sexual mode of transmission: Secondary | ICD-10-CM

## 2019-07-28 MED ORDER — SERTRALINE HCL 100 MG PO TABS: 100.0000 mg | ORAL_TABLET | Freq: Every day | ORAL | 3 refills | Status: DC

## 2019-07-28 MED ORDER — LEVONORGESTREL-ETHINYL ESTRAD 0.1-20 MG-MCG PO TABS: 1.0000 | ORAL_TABLET | Freq: Every day | ORAL | 3 refills | Status: DC

## 2019-07-28 NOTE — Assessment & Plan Note (Signed)
Pt is due for f/u at [facility] for her f/u pap  She will schedule this

## 2019-07-28 NOTE — Patient Instructions (Addendum)
Please call the breast center to schedule your mammogram   Follow up with gyn - call to get your appointment with [facility]  For pap and follow up   The new age to start colonoscopy is 45 instead of 50  Call your ins co - find out if colonoscopy is covered and let us know if it is   You can take multivitamin with iron daily with food - because of mild low iron  Deep leafy greens have iron also   For cholesterol : Avoid red meat/ fried foods/ egg yolks/ fatty breakfast meats/ butter, cheese and high fat dairy/ and shellfish    Take care of yourself

## 2019-07-28 NOTE — Assessment & Plan Note (Signed)
Disc goals for lipids and reasons to control them Rev last labs with pt Rev low sat fat diet in detail  LDL is up to 133 Disc goals for LDL under 130 (better under 100) She plans to cut out some high fat dairy and fried foods from diet

## 2019-07-28 NOTE — Assessment & Plan Note (Signed)
Reviewed health habits including diet and exercise and skin cancer prevention Reviewed appropriate screening tests for age  Also reviewed health mt list, fam hx and immunization status , as well as social and family history   See HPI Labs reviewed  Disc plan for exercise  She will f/u with gyn for pelvic/pap (h/o HPV) and has now been immunized STD testing done and refilled her OC Pt given phone # to schedule her own mammogram  Also discussed colonoscopy (since age for screening was dropped to 25) - she will let us know if her ins covers this so we can schedule it

## 2019-07-28 NOTE — Assessment & Plan Note (Signed)
Mild -suspect from menses Lab Results  Component Value Date   FERRITIN 5.1 (L) 07/24/2019   Advised she start a mvi with iron daily (watch out for constipation)

## 2019-07-28 NOTE — Assessment & Plan Note (Signed)
Doing well with zoloft as long as she continues it  Uses buspar prn but not needing it often In a good relationship now  Encouraged more exercise - plan for yoga at home  Also good self care

## 2019-07-28 NOTE — Assessment & Plan Note (Signed)
Lab Results  Component Value Date   HGBA1C 5.8 07/24/2019   Reassuring  disc imp of low glycemic diet and wt loss to prevent DM2

## 2019-07-28 NOTE — Progress Notes (Signed)
Subjective:    Patient ID: Kara [name], female    DOB: 10-08-1970, 49 y.o.   MRN: [id number]  This visit occurred during the SARS-CoV-2 public health emergency.  Safety protocols were in place, including screening questions prior to the visit, additional usage of staff PPE, and extensive cleaning of exam room while observing appropriate contact time as indicated for disinfecting solutions.    HPI Here for health maintenance exam and to review chronic medical problems    Is doing well    Wt Readings from Last 3 Encounters:  07/28/19 116 lb 8 oz (52.8 kg)  05/05/18 113 lb (51.3 kg)  02/12/18 107 lb 8 oz (48.8 kg)   20.80 kg/m   She wants to exercise more  Likes to do yoga -has to fit it in (would rather do group class)  Eats healthy    Mammogram 9/19 -needs one  Self breast exam - no lumps or changes   Pap 11/19 -neg but had high risk HPV  She had the vaccinations for HPV after that  She had colposcopy at gyn - and f/u was good -needs to follow up /is due  She goes to [facility]  Menses- fine / not too heavy or painful    Usually 4-5 days  Taking lessina - px by gyn     Depression/anxiety  Taking buspar and zoloft  Is getting counseling  Stress level is high but she is tolerating it  Also some good things going on   STD screening:  Tdap 2/14 Flu vaccine 10/20 HIV screen neg 2/21  Hepatitis screen neg 2/21 RPR neg 2/21  Desires gc/chlam test today No symptoms right now- wanted to get screened  Is in a monogamous relationship now and they decided to get tested     BP Readings from Last 3 Encounters:  07/28/19 134/86  05/05/18 130/70  02/12/18 124/82   Pulse Readings from Last 3 Encounters:  07/28/19 89  07/22/18 73  05/05/18 76     Prediabetes Lab Results  Component Value Date   HGBA1C 5.8 07/24/2019  good  Eating healthy    Iron def anemia Lab Results  Component Value Date   WBC 5.4 07/24/2019   HGB 11.3 (L) 07/24/2019   HCT 34.8 (L) 07/24/2019   MCV 85.0 07/24/2019   PLT 326.0 07/24/2019  improved from 10.8 Hb last time  Lab Results  Component Value Date   FERRITIN 5.1 (L) 07/24/2019  not taking iron    Cholesterol Lab Results  Component Value Date   CHOL 220 (H) 07/24/2019   CHOL 204 (H) 04/21/2018   CHOL 187 11/12/2016   Lab Results  Component Value Date   HDL 72.90 07/24/2019   HDL 73 04/21/2018   HDL 56 11/12/2016   Lab Results  Component Value Date   LDLCALC 133 (H) 07/24/2019   LDLCALC 118 (H) 04/21/2018   LDLCALC 121 (H) 11/12/2016   Lab Results  Component Value Date   TRIG 70.0 07/24/2019   TRIG 67 04/21/2018   TRIG 52 11/12/2016   Lab Results  Component Value Date   CHOLHDL 3 07/24/2019   CHOLHDL 2.8 04/21/2018   CHOLHDL 3.3 11/12/2016   Lab Results  Component Value Date   LDLDIRECT 141.4 07/10/2013   Need to watch  Fatty dairy-cheese and ice cream  occ fried foods   Other labs  Lab Results  Component Value Date   CREATININE 0.82 07/24/2019   BUN 19 07/24/2019  NA 139 07/24/2019   K 4.7 07/24/2019   CL 104 07/24/2019   CO2 30 07/24/2019   Lab Results  Component Value Date   ALT 13 07/24/2019   AST 17 07/24/2019   GGT 8 04/21/2018   ALKPHOS 41 07/24/2019   BILITOT 0.5 07/24/2019   Lab Results  Component Value Date   TSH 1.91 07/24/2019    Patient Active Problem List   Diagnosis Date Noted  . Mild hyperlipidemia 07/28/2019  . Cervical intraepithelial neoplasia grade III with severe dysplasia 07/18/2018  . 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  . Encounter for annual routine gynecological examination 05/05/2018  . Prediabetes 05/05/2018  . Iron deficiency anemia 05/05/2018  . Abnormal mammogram of left breast 03/24/2018  . Premenstrual dysphoric disorder 02/12/2018  . Screen for STD (sexually transmitted disease) 02/12/2018  . Allergic rhinitis 02/12/2018  . External hemorrhoids 02/12/2018  . Routine general medical examination at a health care  facility 07/15/2012  . Depression 04/09/2011   Past Medical History:  Diagnosis Date  . Depression   . Mitral valve prolapse    No past surgical history on file. Social History   Tobacco Use  . Smoking status: Never Smoker  . Smokeless tobacco: Never Used  Substance Use Topics  . Alcohol use: Yes    Alcohol/week: 0.0 standard drinks    Comment: occasional  . Drug use: Never   Family History  Problem Relation Age of Onset  . Cancer Maternal Aunt 55       breast   Allergies  Allergen Reactions  . Sulfa Antibiotics Shortness Of Breath   Current Outpatient Medications on File Prior to Visit  Medication Sig Dispense Refill  . busPIRone (BUSPAR) 15 MG tablet Take 0.5 tablets (7.5 mg total) by mouth 2 (two) times daily as needed. 30 tablet 5  . cetirizine (ZYRTEC) 10 MG tablet Take 1 tablet (10 mg total) by mouth daily. Take by mouth daily as needed 30 tablet 11  . diphenhydrAMINE (BENADRYL ALLERGY) 25 MG tablet Take by mouth as directed as needed.     . fluticasone (FLONASE) 50 MCG/ACT nasal spray Place 2 sprays into both nostrils daily. 16 g 6  . hydrocortisone (ANUSOL-HC) 2.5 % rectal cream Place 1 application rectally 2 (two) times daily as needed for hemorrhoids or anal itching. 30 g 1  . sodium chloride (OCEAN) 0.65 % SOLN nasal spray Place 2 sprays into both nostrils every 2 (two) hours while awake for 30 days.  0   No current facility-administered medications on file prior to visit.    Review of Systems  Constitutional: Negative for activity change, appetite change, fatigue, fever and unexpected weight change.  HENT: Negative for congestion, ear pain, rhinorrhea, sinus pressure and sore throat.   Eyes: Negative for pain, redness and visual disturbance.  Respiratory: Negative for cough, shortness of breath and wheezing.   Cardiovascular: Negative for chest pain and palpitations.  Gastrointestinal: Negative for abdominal pain, blood in stool, constipation and diarrhea.    Endocrine: Negative for polydipsia and polyuria.  Genitourinary: Negative for dysuria, frequency and urgency.  Musculoskeletal: Negative for arthralgias, back pain and myalgias.  Skin: Negative for pallor and rash.  Allergic/Immunologic: Negative for environmental allergies.  Neurological: Negative for dizziness, syncope and headaches.  Hematological: Negative for adenopathy. Does not bruise/bleed easily.  Psychiatric/Behavioral: Negative for decreased concentration and dysphoric mood. The patient is not nervous/anxious.        Mood is much better  Objective:   Physical Exam Constitutional:      General: She is not in acute distress.    Appearance: Normal appearance. She is well-developed and normal weight. She is not ill-appearing or diaphoretic.     Comments: Slim and well appearing  HENT:     Head: Normocephalic and atraumatic.     Right Ear: Tympanic membrane, ear canal and external ear normal.     Left Ear: Tympanic membrane, ear canal and external ear normal.     Nose: Nose normal. No congestion.     Mouth/Throat:     Mouth: Mucous membranes are moist.     Pharynx: Oropharynx is clear. No posterior oropharyngeal erythema.  Eyes:     General: No scleral icterus.    Extraocular Movements: Extraocular movements intact.     Conjunctiva/sclera: Conjunctivae normal.     Pupils: Pupils are equal, round, and reactive to light.  Neck:     Thyroid: No thyromegaly.     Vascular: No carotid bruit or JVD.  Cardiovascular:     Rate and Rhythm: Normal rate and regular rhythm.     Pulses: Normal pulses.     Heart sounds: Normal heart sounds. No gallop.   Pulmonary:     Effort: Pulmonary effort is normal. No respiratory distress.     Breath sounds: Normal breath sounds. No wheezing.     Comments: Good air exch Chest:     Chest wall: No tenderness.  Abdominal:     General: Bowel sounds are normal. There is no distension or abdominal bruit.     Palpations: Abdomen is soft.  There is no mass.     Tenderness: There is no abdominal tenderness.     Hernia: No hernia is present.  Genitourinary:    Comments: Breast exam: No mass, nodules, thickening, tenderness, bulging, retraction, inflamation, nipple discharge or skin changes noted.  No axillary or clavicular LA.     Breast implants noted (saline) and R one is smaller/softer than the L   Pt goes to gyn for pelvic exam and pap Musculoskeletal:        General: No tenderness. Normal range of motion.     Cervical back: Normal range of motion and neck supple. No rigidity. No muscular tenderness.     Right lower leg: No edema.     Left lower leg: No edema.  Lymphadenopathy:     Cervical: No cervical adenopathy.  Skin:    General: Skin is warm and dry.     Coloration: Skin is not pale.     Findings: No erythema or rash.     Comments: Solar lentigines diffusely   Neurological:     Mental Status: She is alert. Mental status is at baseline.     Cranial Nerves: No cranial nerve deficit.     Motor: No abnormal muscle tone.     Coordination: Coordination normal.     Gait: Gait normal.     Deep Tendon Reflexes: Reflexes are normal and symmetric. Reflexes normal.  Psychiatric:        Mood and Affect: Mood normal.        Cognition and Memory: Cognition and memory normal.           Assessment & Plan:   Problem List Items Addressed This Visit      Other   Depression    Doing well with zoloft as long as she continues it  Uses buspar prn but not needing it often In a good  relationship now  Encouraged more exercise - plan for yoga at home  Also good self care      Relevant Medications   sertraline (ZOLOFT) 100 MG tablet   Routine general medical examination at a health care facility - Primary    Reviewed health habits including diet and exercise and skin cancer prevention Reviewed appropriate screening tests for age  Also reviewed health mt list, fam hx and immunization status , as well as social and  family history   See HPI Labs reviewed  Disc plan for exercise  She will f/u with gyn for pelvic/pap (h/o HPV) and has now been immunized STD testing done and refilled her OC Pt given phone # to schedule her own mammogram  Also discussed colonoscopy (since age for screening was dropped to 38) - she will let us know if her ins covers this so we can schedule it        Screen for STD (sexually transmitted disease)    No symptoms Pt in a monogamous relationship and both parties are being tested  All neg- pending gc/chlamydia  Discussed safe sexual practices      Relevant Orders   C. trachomatis/N. gonorrhoeae RNA   Prediabetes    Lab Results  Component Value Date   HGBA1C 5.8 07/24/2019   Reassuring  disc imp of low glycemic diet and wt loss to prevent DM2       Iron deficiency anemia    Mild -suspect from menses Lab Results  Component Value Date   FERRITIN 5.1 (L) 07/24/2019   Advised she start a mvi with iron daily (watch out for constipation)        High risk HPV infection    Pt is due for f/u at [facility] for her f/u pap  She will schedule this       Mild hyperlipidemia    Disc goals for lipids and reasons to control them Rev last labs with pt Rev low sat fat diet in detail  LDL is up to 133 Disc goals for LDL under 130 (better under 100) She plans to cut out some high fat dairy and fried foods from diet

## 2019-07-28 NOTE — Assessment & Plan Note (Signed)
No symptoms Pt in a monogamous relationship and both parties are being tested  All neg- pending gc/chlamydia  Discussed safe sexual practices

## 2019-07-29 LAB — C. TRACHOMATIS/N. GONORRHOEAE RNA
C. trachomatis RNA, TMA: NOT DETECTED
N. gonorrhoeae RNA, TMA: NOT DETECTED

## 2019-09-01 ENCOUNTER — Ambulatory Visit: Payer: Self-pay | Admitting: Registered Nurse

## 2019-09-01 ENCOUNTER — Other Ambulatory Visit: Payer: Self-pay

## 2019-09-01 ENCOUNTER — Encounter: Payer: Self-pay | Admitting: Registered Nurse

## 2019-09-01 VITALS — BP 127/97 | HR 82 | Temp 98.7°F | Resp 16

## 2019-09-01 DIAGNOSIS — R202 Paresthesia of skin: Secondary | ICD-10-CM

## 2019-09-01 DIAGNOSIS — S161XXA Strain of muscle, fascia and tendon at neck level, initial encounter: Secondary | ICD-10-CM

## 2019-09-01 DIAGNOSIS — R030 Elevated blood-pressure reading, without diagnosis of hypertension: Secondary | ICD-10-CM

## 2019-09-01 DIAGNOSIS — S46912A Strain of unspecified muscle, fascia and tendon at shoulder and upper arm level, left arm, initial encounter: Secondary | ICD-10-CM

## 2019-09-01 MED ORDER — IBUPROFEN 800 MG PO TABS: 800.0000 mg | ORAL_TABLET | Freq: Three times a day (TID) | ORAL | 0 refills | Status: DC

## 2019-09-01 MED ORDER — BIOFREEZE 4 % EX GEL: 1.0000 "application " | Freq: Four times a day (QID) | CUTANEOUS | Status: AC | PRN

## 2019-09-01 MED ORDER — ACETAMINOPHEN 500 MG PO TABS: 1000.0000 mg | ORAL_TABLET | Freq: Four times a day (QID) | ORAL | 0 refills | Status: AC | PRN

## 2019-09-01 MED ORDER — CYCLOBENZAPRINE HCL 10 MG PO TABS: 5.0000 mg | ORAL_TABLET | Freq: Three times a day (TID) | ORAL | 0 refills | Status: AC | PRN

## 2019-09-01 NOTE — Progress Notes (Signed)
Subjective:    Patient ID: Kara [name], female    DOB: 1971-05-06, 49 y.o.   MRN: [id number]  48y/o caucasian female established patient here for evaluation tingling/numbness left 2-4th fingers and left scapular pain that started labor day weekend playing golf improved but then worsening again.   Initially symptoms more in wrist/fingers then moved up to elbow resolved and now fingers worse again along with neck/mid back.  Trouble looking over shoulder to back up in car due to pain and decreased cervical AROM.  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  Frequent lifting.  Patient also reported this past week has been in prolonged static positions and holding items in hands.  Denied weakness arm/hand or trauma, loss of bowel/bladder control or saddle paresthesias.  Patient has tried rest, motrin 800mg  po TID prn, stretching.     Review of Systems  Constitutional: Positive for activity change. Negative for appetite change, chills, diaphoresis, fatigue and fever.  HENT: Negative for trouble swallowing and voice change.   Eyes: Negative for photophobia and visual disturbance.  Respiratory: Negative for cough, shortness of breath, wheezing and stridor.   Cardiovascular: Negative for chest pain and palpitations.  Gastrointestinal: Negative for constipation, diarrhea and vomiting.  Endocrine: Negative for cold intolerance and heat intolerance.  Genitourinary: Negative for difficulty urinating and enuresis.  Musculoskeletal: Positive for myalgias. Negative for gait problem, joint swelling and neck stiffness.  Skin: Negative for rash.  Allergic/Immunologic: Positive for environmental allergies. Negative for food allergies.  Neurological:  Positive for numbness. Negative for dizziness, tremors, seizures, syncope, facial asymmetry, speech difficulty, weakness, light-headedness and headaches.  Hematological: Negative for adenopathy. Does not bruise/bleed easily.  Psychiatric/Behavioral: Negative for agitation, confusion and sleep disturbance.       Objective:   Physical Exam Vitals reviewed.  Constitutional:      General: She is awake. She is not in acute distress.    Appearance: Normal appearance. She is well-developed, well-groomed and normal weight. She is not ill-appearing, toxic-appearing or diaphoretic.  HENT:     Head: Normocephalic and atraumatic.     Jaw: There is normal jaw occlusion.     Salivary Glands: Right salivary gland is not diffusely enlarged or tender. Left salivary gland is not diffusely enlarged or tender.     Right Ear: Hearing and external ear normal.     Left Ear: Hearing and external ear normal.     Nose: Nose normal. No congestion or rhinorrhea.     Mouth/Throat:     Lips: Pink. No lesions.     Mouth: No angioedema.     Pharynx: Oropharynx is clear.  Eyes:     General: Lids are normal. Vision grossly intact. Gaze aligned appropriately. Allergic shiner present. No visual field deficit or scleral icterus.       Right eye: No discharge.        Left eye: No discharge.     Extraocular Movements: Extraocular movements intact.     Conjunctiva/sclera: Conjunctivae normal.     Pupils: Pupils are equal, round, and reactive to light.  Neck:     Thyroid: No thyroid mass, thyromegaly or thyroid tenderness.     Vascular: No JVD.     Trachea: Trachea and phonation normal. No tracheal deviation.  Comments: Left trapezius tight/spasm and TTP medial to left scapula adjacent to paraspinals C6-T2 no swelling/rash/ecchymosis Cardiovascular:     Rate and Rhythm: Normal rate and regular rhythm.     Pulses: Normal pulses.          Radial pulses are 2+ on the right side and 2+ on the left side.     Heart  sounds: Normal heart sounds. No murmur.  Pulmonary:     Effort: Pulmonary effort is normal. No respiratory distress.     Breath sounds: Normal breath sounds and air entry. No stridor, decreased air movement or transmitted upper airway sounds. No decreased breath sounds, wheezing, rhonchi or rales.     Comments: Spoke full sentences without difficulty; no cough observed in exam room wearing cloth mask due to covid 19 pandemic Abdominal:     General: Abdomen is flat.  Musculoskeletal:        General: Tenderness and signs of injury present. No swelling or deformity.     Right shoulder: Normal. No swelling, deformity, effusion, laceration, tenderness, bony tenderness or crepitus. Normal range of motion. Normal strength. Normal pulse.     Left shoulder: Tenderness present. No swelling, deformity, effusion, laceration, bony tenderness or crepitus. Decreased range of motion. Normal strength. Normal pulse.     Right upper arm: Normal.     Left upper arm: Normal.     Right elbow: Normal.     Left elbow: Normal.     Right forearm: Normal.     Left forearm: Normal.     Right wrist: Normal.     Left wrist: Normal.     Right hand: Normal.     Left hand: Normal.       Arms:     Cervical back: Neck supple. Spasms and tenderness present. No swelling, edema, deformity, erythema, signs of trauma, lacerations, rigidity, torticollis, bony tenderness or crepitus. Pain with movement and muscular tenderness present. No spinous process tenderness. Decreased range of motion.     Thoracic back: Spasms and tenderness present. No swelling, edema, deformity, signs of trauma, lacerations or bony tenderness. Decreased range of motion. No scoliosis.     Lumbar back: Normal. No swelling, edema, deformity, signs of trauma, lacerations, spasms, tenderness or bony tenderness. Normal range of motion. No scoliosis.     Right hip: Normal.     Left hip: Normal.     Right upper leg: Normal.     Left upper leg: Normal.      Right knee: Normal.     Left knee: Normal.     Right lower leg: Normal. No edema.     Left lower leg: Normal. No edema.     Right ankle: Normal.     Left ankle: Normal.     Comments: Forward flexion cervical decreased along with left lateral bending due to pain compared to right  Lymphadenopathy:     Head:     Right side of head: No submental, submandibular, tonsillar or preauricular adenopathy.     Left side of head: No submental, submandibular, tonsillar or preauricular adenopathy.     Cervical: No cervical adenopathy.     Right cervical: No superficial cervical adenopathy.    Left cervical: No superficial cervical adenopathy.  Skin:    General: Skin is warm and dry.     Capillary Refill: Capillary refill takes less than 2 seconds.     Coloration: Skin is not ashen, cyanotic, jaundiced, mottled, pale or sallow.     Findings: No  abrasion, abscess, acne, bruising, burn, ecchymosis, erythema, signs of injury, laceration, lesion, petechiae, rash or wound.     Nails: There is no clubbing.  Neurological:     General: No focal deficit present.     Mental Status: She is alert and oriented to person, place, and time. Mental status is at baseline. She is not disoriented.     GCS: GCS eye subscore is 4. GCS verbal subscore is 5. GCS motor subscore is 6.     Cranial Nerves: Cranial nerves are intact. No cranial nerve deficit, dysarthria or facial asymmetry.     Sensory: Sensation is intact. No sensory deficit.     Motor: Motor function is intact. No weakness, tremor, atrophy, abnormal muscle tone or seizure activity.     Coordination: Coordination is intact. Coordination normal.     Gait: Gait is intact. Gait normal.     Deep Tendon Reflexes: Reflexes normal.     Comments: Strength 5/5 bilaterally arms/legs; patient reported tingling/numbness constant left 3-5th fingers able to detect sharp/dull; in/out of chair and on/off exam table without difficulty and gait sure and steady in clinic    Psychiatric:        Attention and Perception: Attention and perception normal. She is attentive.        Mood and Affect: Mood and affect normal.        Speech: Speech normal.        Behavior: Behavior normal. Behavior is cooperative.        Thought Content: Thought content normal.        Cognition and Memory: Cognition and memory normal.        Judgment: Judgment normal.           Assessment & Plan:  A-acute cervical strain initial visit, acute thoracic strain initial visit, paresthesias left arm, elevated blood pressure  P-Reviewed Epic 2002 cervical xray with bone spurring noted C5-7 discussed with patient DJD cervical history.  Patient reported improvement in symptoms with movement/stretching and motrin 800mg  po TID.  cyclobenazeprine/flexeril 10mg  sig 1/2-1 po TID prn muscle spasms #30 RF0 dispensed from [facility].  Ibuprofen 800mg  po TID prn pain at home discussed with patient to take with food as she has started to notice some nausea.  Discussed it can cause gastritis.  Consider switching to tylenol 1000mg  po QID given 4 UD from clinic stock.  Avoid alcohol intake and driving while taking cyclobenazeprine/flexeril as drowsiness common side effect.  Slow position changes as medication also lower blood pressure.  Home stretches demonstrated to patient-e.g. Arm circles, walking up wall, chest stretches, neck AROM, chin tucks, knee to chest and rock side to side on back. Self massage or professional prn, foam roller use or tennis/racquetball. biofreeze gel topical QID affected area prn pain given 4 UD from clinic stock.    Heat/cryotherapy 15 minutes QID prn.  Trial thermacare 1 applied and another given to patient for use tomorrow from clinic stock.  Consider physical therapy referral if no improvement with prescribed therapy from [facility] and/or chiropractic care.  Ensure ergonomics correct desk at work avoid repetitive motions if possible/holding phone/laptop in hand use desk/stand and/or break up  lifting items into smaller loads/weights.  Patient was instructed to rest, ice, and ROM exercises.  Activity as tolerated.   Follow up if symptoms persist or worsen especially if loss of bowel/bladder control, arm/leg weakness and/or saddle paresthesias.  Exitcare handout on muscle spasms and rehab exercises cervical and thoracic printed and given to patient.  Patient verbalized agreement and understanding of treatment plan and had no further questions at this time.  P2:  Injury Prevention and Fitness.  Elevated blood pressure with acute pain.  Discussed flexeril will lower BP along with treatment of pain. ER if chest pain, worst headache of life, dyspnea or visual changes for re-evaluation.  Will see RN Hildred Alamin next week for repeat BP check.  Patient verbalized understanding information/instructions, agreed with plan of care and had no further questions at this time.

## 2019-09-01 NOTE — Patient Instructions (Addendum)
Paresthesia Paresthesia is an abnormal burning or prickling sensation. It is usually felt in the hands, arms, legs, or feet. However, it may occur in any part of the body. Usually, paresthesia is not painful. It may feel like:  Tingling or numbness.  Buzzing.  Itching. Paresthesia may occur without any clear cause, or it may be caused by:  Breathing too quickly (hyperventilation).  Pressure on a nerve.  An underlying medical condition.  Side effects of a medication.  Nutritional deficiencies.  Exposure to toxic chemicals. Most people experience temporary (transient) paresthesia at some time in their lives. For some people, it may be long-lasting (chronic) because of an underlying medical condition. If you have paresthesia that lasts a long time, you may need to be evaluated by your health care provider. Follow these instructions at home: Alcohol use   Do not drink alcohol if: ? Your health care provider tells you not to drink. ? You are pregnant, may be pregnant, or are planning to become pregnant.  If you drink alcohol: ? Limit how much you use to:  0-1 drink a day for women.  0-2 drinks a day for men. ? Be aware of how much alcohol is in your drink. In the [place] one drink equals one 12 oz bottle of beer (355 mL), one 5 oz glass of wine (148 mL), or one 1 oz glass of hard liquor (44 mL). Nutrition   Eat a healthy diet. This includes: ? Eating foods that are high in fiber, such as fresh fruits and vegetables, whole grains, and beans. ? Limiting foods that are high in fat and processed sugars, such as fried or sweet foods. General instructions  Take over-the-counter and prescription medicines only as told by your health care provider.  Do not use any products that contain nicotine or tobacco, such as cigarettes and e-cigarettes. These can keep blood from reaching damaged nerves. If you need help quitting, ask your health care provider.  If you have diabetes, work  closely with your health care provider to keep your blood sugar under control.  If you have numbness in your feet: ? Check every day for signs of injury or infection. Watch for redness, warmth, and swelling. ? Wear padded socks and comfortable shoes. These help protect your feet.  Keep all follow-up visits as told by your health care provider. This is important. Contact a health care provider if you:  Have paresthesia that gets worse or does not go away.  Have a burning or prickling feeling that gets worse when you walk.  Have pain, cramps, or dizziness.  Develop a rash. Get help right away if you:  Feel weak.  Have trouble walking or moving.  Have problems with speech, understanding, or vision.  Feel confused.  Cannot control your bladder or bowel movements.  Have numbness after an injury.  Develop new weakness in an arm or leg.  Faint. Summary  Paresthesia is an abnormal burning or prickling sensation that is usually felt in the hands, arms, legs, or feet. It may also occur in other parts of the body.  Paresthesia may occur without any clear cause, or it may be caused by breathing too quickly (hyperventilation), pressure on a nerve, an underlying medical condition, side effects of a medication, nutritional deficiencies, or exposure to toxic chemicals.  If you have paresthesia that lasts a long time, you may need to be evaluated by your health care provider. This information is not intended to replace advice given to you by   your health care provider. Make sure you discuss any questions you have with your health care provider. Document Revised: 06/23/2018 Document Reviewed: 06/06/2017 Elsevier Patient Education  2020 [employer] Muscle Cramps and Spasms Muscle cramps and spasms occur when a muscle or muscles tighten and you have no control over this tightening (involuntary muscle contraction). They are a common problem and can develop in any muscle. The most common  place is in the calf muscles of the leg. Muscle cramps and muscle spasms are both involuntary muscle contractions, but there are some differences between the two:  Muscle cramps are painful. They come and go and may last for a few seconds or up to 15 minutes. Muscle cramps are often more forceful and last longer than muscle spasms.  Muscle spasms may or may not be painful. They may also last just a few seconds or much longer. Certain medical conditions, such as diabetes or Parkinson's disease, can make it more likely to develop cramps or spasms. However, cramps or spasms are usually not caused by a serious underlying problem. Common causes include:  Doing more physical work or exercise than your body is ready for (overexertion).  Overuse from repeating certain movements too many times.  Remaining in a certain position for a long period of time.  Improper preparation, form, or technique while playing a sport or doing an activity.  Dehydration.  Injury.  Side effects of some medicines.  Abnormally low levels of the salts and minerals in your blood (electrolytes), especially potassium and calcium. This could happen if you are taking water pills (diuretics) or if you are pregnant. In many cases, the cause of muscle cramps or spasms is not known. Follow these instructions at home: Managing pain and stiffness      Try massaging, stretching, and relaxing the affected muscle. Do this for several minutes at a time.  If directed, apply heat to tight or tense muscles as often as told by your health care provider. Use the heat source that your health care provider recommends, such as a moist heat pack or a heating pad. ? Place a towel between your skin and the heat source. ? Leave the heat on for 20-30 minutes. ? Remove the heat if your skin turns bright red. This is especially important if you are unable to feel pain, heat, or cold. You may have a greater risk of getting burned.  If  directed, put ice on the affected area. This may help if you are sore or have pain after a cramp or spasm. ? Put ice in a plastic bag. ? Place a towel between your skin and the bag. ? Leavethe ice on for 20 minutes, 2-3 times a day.  Try taking hot showers or baths to help relax tight muscles. Eating and drinking  Drink enough fluid to keep your urine pale yellow. Staying well hydrated may help prevent cramps or spasms.  Eat a healthy diet that includes plenty of nutrients to help your muscles function. A healthy diet includes fruits and vegetables, lean protein, whole grains, and low-fat or nonfat dairy products. General instructions  If you are having frequent cramps, avoid intense exercise for several days.  Take over-the-counter and prescription medicines only as told by your health care provider.  Pay attention to any changes in your symptoms.  Keep all follow-up visits as told by your health care provider. This is important. Contact a health care provider if:  Your cramps or spasms get more severe or happen more  often.  Your cramps or spasms do not improve over time. Summary  Muscle cramps and spasms occur when a muscle or muscles tighten and you have no control over this tightening (involuntary muscle contraction).  The most common place for cramps or spasms to occur is in the calf muscles of the leg.  Massaging, stretching, and relaxing the affected muscle may relieve the cramp or spasm.  Drink enough fluid to keep your urine pale yellow. Staying well hydrated may help prevent cramps or spasms. This information is not intended to replace advice given to you by your health care provider. Make sure you discuss any questions you have with your health care provider. Document Revised: 10/21/2017 Document Reviewed: 10/21/2017 Elsevier Patient Education  2020 [employer] Thoracic Strain A thoracic strain, which is sometimes called a mid-back strain, is an injury to the  muscles or tendons that attach to the upper part of your back behind your chest. This type of injury occurs when a muscle is overstretched or overloaded. Thoracic strains can range from mild to severe. Mild strains may involve stretching a muscle or tendon without tearing it. These injuries may heal in 1-2 weeks. More severe strains involve tearing of muscle fibers or tendons. These will cause more pain and may take 6-8 weeks to heal. What are the causes? This condition may be caused by:  Trauma, such as a fall or a hit to the body.  Twisting or overstretching the back. This may result from doing activities that require a lot of energy, such as lifting heavy objects. In some cases, the cause may not be known. What increases the risk? This injury is more common in:  Athletes.  People with obesity. What are the signs or symptoms? The main symptom of this condition is pain in the middle back, especially with movement. Other symptoms include:  Stiffness or limited range of motion.  Sudden muscle tightening (spasms). How is this diagnosed? This condition may be diagnosed based on:  Your symptoms.  Your medical history.  A physical exam.  Imaging tests, such as X-rays or an MRI. How is this treated? This condition may be treated with:  Resting the injured area.  Applying heat and cold to the injured area.  Over-the-counter medicines for pain and inflammation, such as NSAIDs.  Prescription pain medicine or muscle relaxants may be needed for a short time.  Physical therapy. This will involve doing stretching and strengthening exercises. Follow these instructions at home: Managing pain, stiffness, and swelling      If directed, put ice on the injured area. ? Put ice in a plastic bag. ? Place a towel between your skin and the bag. ? Leave the ice on for 20 minutes, 2-3 times a day.  If directed, apply heat to the affected area as often as told by your health care provider.  Use the heat source that your health care provider recommends, such as a moist heat pack or a heating pad. ? Place a towel between your skin and the heat source. ? Leave the heat on for 20-30 minutes. ? Remove the heat if your skin turns bright red. This is especially important if you are unable to feel pain, heat, or cold. You may have a greater risk of getting burned. Activity  Rest and return to your normal activities as told by your health care provider. Ask your health care provider what activities are safe for you.  Do exercises as told by your health care provider. Medicines  Take over-the-counter and prescription medicines only as told by your health care provider.  Ask your health care provider if the medicine prescribed to you: ? Requires you to avoid driving or using heavy machinery. ? Can cause constipation. You may need to take these actions to prevent or treat constipation:  Drink enough fluid to keep your urine pale yellow.  Take over-the-counter or prescription medicines.  Eat foods that are high in fiber, such as beans, whole grains, and fresh fruits and vegetables.  Limit foods that are high in fat and processed sugars, such as fried or sweet foods. Injury prevention To prevent a future mid-back injury:  Always warm up properly before physical activity or sports.  Cool down and stretch after being active.  Use correct form when playing sports and lifting heavy objects. Bend your knees before you lift heavy objects.  Use good posture when sitting and standing.  Stay physically fit and maintain a healthy weight. ? Do at least 150 minutes of moderate-intensity exercise each week, such as brisk walking or water aerobics. ? Do strength exercises at least 2 times each week.  General instructions  Do not use any products that contain nicotine or tobacco, such as cigarettes, e-cigarettes, and chewing tobacco. If you need help quitting, ask your health care  provider.  Keep all follow-up visits as told by your health care provider. This is important. Contact a health care provider if:  Your pain is not helped by medicine.  Your pain or stiffness is getting worse.  You develop pain or stiffness in your neck or lower back. Get help right away if you:  Have shortness of breath.  Have chest pain.  Develop numbness or weakness in your legs or arms.  Have involuntary loss of urine (urinary incontinence). Summary  A thoracic strain, which is sometimes called a mid-back strain, is an injury to the muscles or tendons that attach to the upper part of your back behind your chest.  This type of injury occurs when a muscle is overstretched or overloaded.  Rest and return to your normal activities as told by your health care provider. If directed, apply heat or ice to the affected area as often as told by your health care provider.  Take over-the-counter and prescription medicines only as told by your health care provider.  Contact a health care provider if you have new or worsening symptoms. This information is not intended to replace advice given to you by your health care provider. Make sure you discuss any questions you have with your health care provider. Document Revised: 04/15/2018 Document Reviewed: 04/15/2018 Elsevier Patient Education  2020 [employer]. Thoracic Strain Rehab Ask your health care provider which exercises are safe for you. Do exercises exactly as told by your health care provider and adjust them as directed. It is normal to feel mild stretching, pulling, tightness, or discomfort as you do these exercises. Stop right away if you feel sudden pain or your pain gets worse. Do not begin these exercises until told by your health care provider. Stretching and range-of-motion exercise This exercise warms up your muscles and joints and improves the movement and flexibility of your back and shoulders. This exercise also helps to  relieve pain. Chest and spine stretch  1. Lie down on your back on a firm surface. 2. Roll a towel or a small blanket so it is about 4 inches (10 cm) in diameter. 3. Put the towel lengthwise under the middle of your back so  it is under your spine, but not under your shoulder blades. 4. Put your hands behind your head and let your elbows fall to your sides. This will increase your stretch. 5. Take a deep breath (inhale). 6. Hold for __15-30________ seconds. 7. Relax after you breathe out (exhale). Repeat _____3_____ times. Complete this exercise _____3_____ times a day. Strengthening exercises These exercises build strength and endurance in your back and your shoulder blade muscles. Endurance is the ability to use your muscles for a long time, even after they get tired. Alternating arm and leg raises  1. Get on your hands and knees on a firm surface. If you are on a hard floor, you may want to use padding, such as an exercise mat, to cushion your knees. 2. Line up your arms and legs. Your hands should be directly below your shoulders, and your knees should be directly below your hips. 3. Lift your left leg behind you. At the same time, raise your right arm and straighten it in front of you. ? Do not lift your leg higher than your hip. ? Do not lift your arm higher than your shoulder. ? Keep your abdominal and back muscles tight. ? Keep your hips facing the ground. ? Do not arch your back. ? Keep your balance carefully, and do not hold your breath. 4. Hold for ___15-30_______ seconds. 5. Slowly return to the starting position and repeat with your right leg and your left arm. Repeat ___3_______ times. Complete this exercise __3________ times a day. Straight arm rows This exercise is also called shoulder extension exercise. 1. Stand with your feet shoulder width apart. 2. Secure an exercise band to a stable object in front of you so the band is at or above shoulder height. 3. Hold one end  of the exercise band in each hand. 4. Straighten your elbows and lift your hands up to shoulder height. 5. Step back, away from the secured end of the exercise band, until the band stretches. 6. Squeeze your shoulder blades together and pull your hands down to the sides of your thighs. Stop when your hands are straight down by your sides. This is shoulder extension. Do not let your hands go behind your body. 7. Hold for _____15-30_____ seconds. 8. Slowly return to the starting position. Repeat _____3_____ times. Complete this exercise ____3______ times a day. Prone shoulder external rotation 1. Lie on your abdomen on a firm bed so your left / right forearm hangs over the edge of the bed and your upper arm is on the bed, straight out from your body. This is the prone position. ? Your elbow should be bent. ? Your palm should be facing your feet. 2. If instructed, hold a __none________ weight in your hand. 3. Squeeze your shoulder blade toward the middle of your back. Do not let your shoulder lift toward your ear. 4. Keep your elbow bent in a 90-degree angle (right angle) while you slowly move your forearm up toward the ceiling. Move your forearm up to the height of the bed, toward your head. This is external rotation. ? Your upper arm should not move. ? At the top of the movement, your palm should face the floor. 5. Hold for _15-30_________ seconds. 6. Slowly return to the starting position and relax your muscles. Repeat __3________ times. Complete this exercise __3_______ times a day. Rowing scapular retraction This is an exercise in which the shoulder blades (scapulae) are pulled toward each other (retraction). 1. Sit in a stable  chair without armrests, or stand up. 2. Secure an exercise band to a stable object in front of you so the band is at shoulder height. 3. Hold one end of the exercise band in each hand. Your palms should face down. 4. Bring your arms out straight in front of  you. 5. Step back, away from the secured end of the exercise band, until the band stretches. 6. Pull the band backward. As you do this, bend your elbows and squeeze your shoulder blades together, but avoid letting the rest of your body move. Do not shrug your shoulders upward while you do this. 7. Stop when your elbows are at your sides or slightly behind your body. 8. Hold for _15-30_________ seconds. 9. Slowly straighten your arms to return to the starting position. Repeat ____3______ times. Complete this exercise ____3______ times a day. Posture and body mechanics Good posture and healthy body mechanics can help to relieve stress in your body's tissues and joints. Body mechanics refers to the movements and positions of your body while you do your daily activities. Posture is part of body mechanics. Good posture means:  Your spine is in its natural S-curve position (neutral).  Your shoulders are pulled back slightly.  Your head is not tipped forward. Follow these guidelines to improve your posture and body mechanics in your everyday activities. Standing   When standing, keep your spine neutral and your feet about hip width apart. Keep a slight bend in your knees. Your ears, shoulders, and hips should line up with each other.  When you do a task in which you lean forward while standing in one place for a long time, place one foot up on a stable object that is 2-4 inches (5-10 cm) high, such as a footstool. This helps keep your spine neutral. Sitting   When sitting, keep your spine neutral and keep your feet flat on the floor. Use a footrest, if necessary, and keep your thighs parallel to the floor. Avoid rounding your shoulders, and avoid tilting your head forward.  When working at a desk or a computer, keep your desk at a height where your hands are slightly lower than your elbows. Slide your chair under your desk so you are close enough to maintain good posture.  When working at a  computer, place your monitor at a height where you are looking straight ahead and you do not have to tilt your head forward or downward to look at the screen. Resting When lying down and resting, avoid positions that are most painful for you.  If you have pain with activities such as sitting, bending, stooping, or squatting (flexion-basedactivities), lie in a position in which your body does not bend very much. For example, avoid curling up on your side with your arms and knees near your chest (fetal position).  If you have pain with activities such as standing for a long time or reaching with your arms (extension-basedactivities), lie with your spine in a neutral position and bend your knees slightly. Try the following positions: ? Lie on your side with a pillow between your knees. ? Lie on your back with a pillow under your knees.  Lifting   When lifting objects, keep your feet at least shoulder width apart and tighten your abdominal muscles.  Bend your knees and hips and keep your spine neutral. It is important to lift using the strength of your legs, not your back. Do not lock your knees straight out.  Always ask for  help to lift heavy or awkward objects. This information is not intended to replace advice given to you by your health care provider. Make sure you discuss any questions you have with your health care provider. Document Revised: 09/19/2018 Document Reviewed: 07/07/2018 Elsevier Patient Education  2020 [employer] Cervical Sprain  A cervical sprain is a stretch or tear in one or more of the tough, cord-like tissues that connect bones (ligaments) in the neck. Cervical sprains can range from mild to severe. Severe cervical sprains can cause the spinal bones (vertebrae) in the neck to be unstable. This can lead to spinal cord damage and can result in serious nervous system problems. The amount of time that it takes for a cervical sprain to get better depends on the cause and  extent of the injury. Most cervical sprains heal in 4-6 weeks. What are the causes? Cervical sprains may be caused by an injury (trauma), such as from a motor vehicle accident, a fall, or sudden forward and backward whipping movement of the head and neck (whiplash injury). Mild cervical sprains may be caused by wear and tear over time, such as from poor posture, sitting in a chair that does not provide support, or looking up or down for long periods of time. What increases the risk? The following factors may make you more likely to develop this condition:  Participating in activities that have a high risk of trauma to the neck. These include contact sports, auto racing, gymnastics, and diving.  Taking risks when driving or riding in a motor vehicle, such as speeding.  Having osteoarthritis of the spine.  Having poor strength and flexibility of the neck.  A previous neck injury.  Having poor posture.  Spending a lot of time in certain positions that put stress on the neck, such as sitting at a computer for long periods of time. What are the signs or symptoms? Symptoms of this condition include:  Pain, soreness, stiffness, tenderness, swelling, or a burning sensation in the front, back, or sides of the neck.  Sudden tightening of neck muscles that you cannot control (muscle spasms).  Pain in the shoulders or upper back.  Limited ability to move the neck.  Headache.  Dizziness.  Nausea.  Vomiting.  Weakness, numbness, or tingling in a hand or an arm. Symptoms may develop right away after injury, or they may develop over a few days. In some cases, symptoms may go away with treatment and return (recur) over time. How is this diagnosed? This condition may be diagnosed based on:  Your medical history.  Your symptoms.  Any recent injuries or known neck problems that you have, such as arthritis in the neck.  A physical exam.  Imaging tests, such as: ? X-rays. ? MRI. ? CT  scan. How is this treated? This condition is treated by resting and icing the injured area and doing physical therapy exercises. Depending on the severity of your condition, treatment may also include:  Keeping your neck in place (immobilized) for periods of time. This may be done using: ? A cervical collar. This supports your chin and the back of your head. ? A cervical traction device. This is a sling that holds up your head. This removes weight and pressure from your neck, and it may help to relieve pain.  Medicines that help to relieve pain and inflammation.  Medicines that help to relax your muscles (muscle relaxants).  Surgery. This is rare. Follow these instructions at home: If you have a  cervical collar:   Wear it as told by your health care provider. Do not remove the collar unless instructed by your health care provider.  Ask your health care provider before you make any adjustments to your collar.  If you have long hair, keep it outside of the collar.  Ask your health care provider if you can remove the collar for cleaning and bathing. If you are allowed to remove the collar for cleaning or bathing: ? Follow instructions from your health care provider about how to remove the collar safely. ? Clean the collar by wiping it with mild soap and water and drying it completely. ? If your collar has removable pads, remove them every 1-2 days and wash them by hand with soap and water. Let them air-dry completely before you put them back in the collar. ? Check your skin under the collar for irritation or sores. If you see any, tell your health care provider. Managing pain, stiffness, and swelling   If directed, use a cervical traction device as told by your health care provider.  If directed, apply heat to the affected area before you do your physical therapy or as often as told by your health care provider. Use the heat source that your health care provider recommends, such as a  moist heat pack or a heating pad. ? Place a towel between your skin and the heat source. ? Leave the heat on for 20-30 minutes. ? Remove the heat if your skin turns bright red. This is especially important if you are unable to feel pain, heat, or cold. You may have a greater risk of getting burned.  If directed, put ice on the affected area: ? Put ice in a plastic bag. ? Place a towel between your skin and the bag. ? Leave the ice on for 20 minutes, 2-3 times a day. Activity  Do not drive while wearing a cervical collar. If you do not have a cervical collar, ask your health care provider if it is safe to drive while your neck heals.  Do not drive or use heavy machinery while taking prescription pain medicine or muscle relaxants, unless your health care provider approves.  Do not lift anything that is heavier than 10 lb (4.5 kg) until your health care provider tells you that it is safe.  Rest as directed by your health care provider. Avoid positions and activities that make your symptoms worse. Ask your health care provider what activities are safe for you.  If physical therapy was prescribed, do exercises as told by your health care provider or physical therapist. General instructions  Take over-the-counter and prescription medicines only as told by your health care provider.  Do not use any products that contain nicotine or tobacco, such as cigarettes and e-cigarettes. These can delay healing. If you need help quitting, ask your health care provider.  Keep all follow-up visits as told by your health care provider or physical therapist. This is important. How is this prevented? To prevent a cervical sprain from happening again:  Use and maintain good posture. Make any needed adjustments to your workstation to help you use good posture.  Exercise regularly as directed by your health care provider or physical therapist.  Avoid risky activities that may cause a cervical  sprain. Contact a health care provider if:  You have symptoms that get worse or do not get better after 2 weeks of treatment.  You have pain that gets worse or does not get better  with medicine.  You develop new, unexplained symptoms.  You have sores or irritated skin on your neck from wearing your cervical collar. Get help right away if:  You have severe pain.  You develop numbness, tingling, or weakness in any part of your body.  You cannot move a part of your body (you have paralysis).  You have neck pain along with: ? Severe dizziness. ? Headache. Summary  A cervical sprain is a stretch or tear in one or more of the tough, cord-like tissues that connect bones (ligaments) in the neck.  Cervical sprains may be caused by an injury (trauma), such as from a motor vehicle accident, a fall, or sudden forward and backward whipping movement of the head and neck (whiplash injury).  Symptoms may develop right away after injury, or they may develop over a few days.  This condition is treated by resting and icing the injured area and doing physical therapy exercises. This information is not intended to replace advice given to you by your health care provider. Make sure you discuss any questions you have with your health care provider. Document Revised: 09/17/2018 Document Reviewed: 01/25/2016 Elsevier Patient Education  2020 [employer] Cervical Strain and Sprain Rehab Ask your health care provider which exercises are safe for you. Do exercises exactly as told by your health care provider and adjust them as directed. It is normal to feel mild stretching, pulling, tightness, or discomfort as you do these exercises. Stop right away if you feel sudden pain or your pain gets worse. Do not begin these exercises until told by your health care provider. Stretching and range-of-motion exercises Cervical side bending  8. Using good posture, sit on a stable chair or stand up. 9. Without moving  your shoulders, slowly tilt your left / right ear to your shoulder until you feel a stretch in the opposite side neck muscles. You should be looking straight ahead. 10. Hold for ____15-30______ seconds. 11. Repeat with the other side of your neck. Repeat ___3_______ times. Complete this exercise ____3______ times a day. Cervical rotation  6. Using good posture, sit on a stable chair or stand up. 7. Slowly turn your head to the side as if you are looking over your left / right shoulder. ? Keep your eyes level with the ground. ? Stop when you feel a stretch along the side and the back of your neck. 8. Hold for __15-30________ seconds. 9. Repeat this by turning to your other side. Repeat ____3______ times. Complete this exercise _____3_____ times a day. Thoracic extension and pectoral stretch 9. Roll a towel or a small blanket so it is about 4 inches (10 cm) in diameter. 10. Lie down on your back on a firm surface. 11. Put the towel lengthwise, under your spine in the middle of your back. It should not be under your shoulder blades. The towel should line up with your spine from your middle back to your lower back. 12. Put your hands behind your head and let your elbows fall out to your sides. 13. Hold for ___15-30_______ seconds. Repeat _____3_____ times. Complete this exercise ______3____ times a day. Strengthening exercises Isometric upper cervical flexion 7. Lie on your back with a thin pillow behind your head and a small rolled-up towel under your neck. 8. Gently tuck your chin toward your chest and nod your head down to look toward your feet. Do not lift your head off the pillow. 9. Hold for ___15-30_______ seconds. 10. Release the tension slowly. Relax  your neck muscles completely before you repeat this exercise. Repeat _____3_____ times. Complete this exercise ___3_______ times a day. Isometric cervical extension  10. Stand about 6 inches (15 cm) away from a wall, with your back  facing the wall. 11. Place a soft object, about 6-8 inches (15-20 cm) in diameter, between the back of your head and the wall. A soft object could be a small pillow, a ball, or a folded towel. 12. Gently tilt your head back and press into the soft object. Keep your jaw and forehead relaxed. 13. Hold for __15-30________ seconds. 14. Release the tension slowly. Relax your neck muscles completely before you repeat this exercise. Repeat ___3_______ times. Complete this exercise ____3______ times a day. Posture and body mechanics Body mechanics refers to the movements and positions of your body while you do your daily activities. Posture is part of body mechanics. Good posture and healthy body mechanics can help to relieve stress in your body's tissues and joints. Good posture means that your spine is in its natural S-curve position (your spine is neutral), your shoulders are pulled back slightly, and your head is not tipped forward. The following are general guidelines for applying improved posture and body mechanics to your everyday activities. Sitting  1. When sitting, keep your spine neutral and keep your feet flat on the floor. Use a footrest, if necessary, and keep your thighs parallel to the floor. Avoid rounding your shoulders, and avoid tilting your head forward. 2. When working at a desk or a computer, keep your desk at a height where your hands are slightly lower than your elbows. Slide your chair under your desk so you are close enough to maintain good posture. 3. When working at a computer, place your monitor at a height where you are looking straight ahead and you do not have to tilt your head forward or downward to look at the screen. Standing   When standing, keep your spine neutral and keep your feet about hip-width apart. Keep a slight bend in your knees. Your ears, shoulders, and hips should line up.  When you do a task in which you stand in one place for a long time, place one foot  up on a stable object that is 2-4 inches (5-10 cm) high, such as a footstool. This helps keep your spine neutral. Resting When lying down and resting, avoid positions that are most painful for you. Try to support your neck in a neutral position. You can use a contour pillow or a small rolled-up towel. Your pillow should support your neck but not push on it. This information is not intended to replace advice given to you by your health care provider. Make sure you discuss any questions you have with your health care provider. Document Revised: 09/17/2018 Document Reviewed: 02/26/2018 Elsevier Patient Education  [place].

## 2019-09-07 ENCOUNTER — Ambulatory Visit: Payer: PRIVATE HEALTH INSURANCE | Attending: Internal Medicine

## 2019-09-07 DIAGNOSIS — Z23 Encounter for immunization: Secondary | ICD-10-CM

## 2019-09-07 NOTE — Progress Notes (Signed)
   Covid-19 Vaccination Clinic  Name:  Kara [name]    MRN: [id number] DOB: [birth year]  09/07/2019  [name] was observed post Covid-19 immunization for 15 minutes without incident. She was provided with Vaccine Information Sheet and instruction to access the V-Safe system.   [name] was instructed to call 911 with any severe reactions post vaccine: Marland Kitchen Difficulty breathing  . Swelling of face and throat  . A fast heartbeat  . A bad rash all over body  . Dizziness and weakness   Immunizations Administered    Name Date Dose VIS Date Route   Pfizer COVID-19 Vaccine 09/07/2019  1:34 PM 0.3 mL 05/22/2019 Intramuscular   Manufacturer: [employer], [employer]   Lot: [id number]   NDC: [address]-8

## 2019-09-30 ENCOUNTER — Ambulatory Visit: Payer: PRIVATE HEALTH INSURANCE | Attending: Internal Medicine

## 2019-09-30 DIAGNOSIS — Z23 Encounter for immunization: Secondary | ICD-10-CM

## 2019-09-30 NOTE — Progress Notes (Signed)
   Covid-19 Vaccination Clinic  Name:  Kara [name]    MRN: [id number] DOB: [birth year]  09/30/2019  [name] was observed post Covid-19 immunization for 15 minutes without incident. She was provided with Vaccine Information Sheet and instruction to access the V-Safe system.   Ms. [name] was instructed to call 911 with any severe reactions post vaccine: Marland Kitchen Difficulty breathing  . Swelling of face and throat  . A fast heartbeat  . A bad rash all over body  . Dizziness and weakness   Immunizations Administered    Name Date Dose VIS Date Route   Pfizer COVID-19 Vaccine 09/30/2019  3:34 PM 0.3 mL 08/05/2018 Intramuscular   Manufacturer: [employer], [employer]   Lot: [id number]   NDC: [address]-6

## 2020-04-09 ENCOUNTER — Ambulatory Visit: Payer: PRIVATE HEALTH INSURANCE | Attending: Internal Medicine

## 2020-04-09 DIAGNOSIS — Z23 Encounter for immunization: Secondary | ICD-10-CM

## 2020-04-09 NOTE — Progress Notes (Signed)
   Covid-19 Vaccination Clinic  Name:  Kara [name]    MRN: [id number] DOB: [birth year]  04/09/2020  [name] was observed post Covid-19 immunization for 15 minutes without incident. She was provided with Vaccine Information Sheet and instruction to access the V-Safe system.   [name] was instructed to call 911 with any severe reactions post vaccine: Marland Kitchen Difficulty breathing  . Swelling of face and throat  . A fast heartbeat  . A bad rash all over body  . Dizziness and weakness

## 2020-04-24 ENCOUNTER — Other Ambulatory Visit: Payer: Self-pay | Admitting: Family Medicine

## 2020-07-01 ENCOUNTER — Other Ambulatory Visit: Payer: Self-pay | Admitting: Family Medicine

## 2020-07-01 NOTE — Telephone Encounter (Signed)
Please schedule PE in the spring and refill until then 

## 2020-07-01 NOTE — Telephone Encounter (Signed)
Last OV was 07/28/19 and no future appts, please advise

## 2020-07-01 NOTE — Telephone Encounter (Signed)
Med refilled once and [facility] or scheduler will reach out to pt to try and get appt scheduled

## 2020-07-07 ENCOUNTER — Other Ambulatory Visit: Payer: Self-pay | Admitting: Family Medicine

## 2020-07-07 NOTE — Telephone Encounter (Signed)
Called and spoke with patient who stated that she does not need a refill on this prescription.

## 2020-07-19 ENCOUNTER — Telehealth: Payer: Self-pay | Admitting: Family Medicine

## 2020-07-19 DIAGNOSIS — D500 Iron deficiency anemia secondary to blood loss (chronic): Secondary | ICD-10-CM

## 2020-07-19 DIAGNOSIS — Z0000 Encounter for general adult medical examination without abnormal findings: Secondary | ICD-10-CM

## 2020-07-19 DIAGNOSIS — E785 Hyperlipidemia, unspecified: Secondary | ICD-10-CM

## 2020-07-19 DIAGNOSIS — R7303 Prediabetes: Secondary | ICD-10-CM

## 2020-07-19 NOTE — Telephone Encounter (Signed)
-----   Message from Aquilla Solian, RT sent at 07/08/2020 10:44 AM EST ----- Regarding: Lab Orders for Thursday 2.10.2022 Please place lab orders for Thursday 2.10.2022, office visit for physical on Thursday 2.17.2022 Thank you, Jones Bales RT(R)

## 2020-07-21 ENCOUNTER — Other Ambulatory Visit (INDEPENDENT_AMBULATORY_CARE_PROVIDER_SITE_OTHER): Payer: PRIVATE HEALTH INSURANCE

## 2020-07-21 ENCOUNTER — Other Ambulatory Visit: Payer: Self-pay

## 2020-07-21 DIAGNOSIS — E785 Hyperlipidemia, unspecified: Secondary | ICD-10-CM | POA: Diagnosis not present

## 2020-07-21 DIAGNOSIS — Z0000 Encounter for general adult medical examination without abnormal findings: Secondary | ICD-10-CM

## 2020-07-21 DIAGNOSIS — R7303 Prediabetes: Secondary | ICD-10-CM

## 2020-07-21 DIAGNOSIS — Z113 Encounter for screening for infections with a predominantly sexual mode of transmission: Secondary | ICD-10-CM

## 2020-07-21 DIAGNOSIS — D500 Iron deficiency anemia secondary to blood loss (chronic): Secondary | ICD-10-CM

## 2020-07-21 LAB — COMPREHENSIVE METABOLIC PANEL
ALT: 9 U/L (ref 0–35)
AST: 15 U/L (ref 0–37)
Albumin: 4.3 g/dL (ref 3.5–5.2)
Alkaline Phosphatase: 40 U/L (ref 39–117)
BUN: 15 mg/dL (ref 6–23)
CO2: 28 mEq/L (ref 19–32)
Calcium: 8.8 mg/dL (ref 8.4–10.5)
Chloride: 105 mEq/L (ref 96–112)
Creatinine, Ser: 0.8 mg/dL (ref 0.40–1.20)
GFR: 86.66 mL/min (ref >60.00)
Glucose, Bld: 90 mg/dL (ref 70–99)
Potassium: 4.5 mEq/L (ref 3.5–5.1)
Sodium: 140 mEq/L (ref 135–145)
Total Bilirubin: 0.3 mg/dL (ref 0.2–1.2)
Total Protein: 7 g/dL (ref 6.0–8.3)

## 2020-07-21 LAB — FERRITIN: Ferritin: 6 ng/mL — ABNORMAL LOW (ref 10.0–291.0)

## 2020-07-21 LAB — CBC WITH DIFFERENTIAL/PLATELET
Basophils Absolute: 0 10*3/uL (ref 0.0–0.1)
Basophils Relative: 0.9 % (ref 0.0–3.0)
Eosinophils Absolute: 0.1 10*3/uL (ref 0.0–0.7)
Eosinophils Relative: 2.3 % (ref 0.0–5.0)
HCT: 34.1 % — ABNORMAL LOW (ref 36.0–46.0)
Hemoglobin: 11 g/dL — ABNORMAL LOW (ref 12.0–15.0)
Lymphocytes Relative: 25.3 % (ref 12.0–46.0)
Lymphs Abs: 1.3 10*3/uL (ref 0.7–4.0)
MCHC: 32.3 g/dL (ref 30.0–36.0)
MCV: 82.3 fl (ref 78.0–100.0)
Monocytes Absolute: 0.2 10*3/uL (ref 0.1–1.0)
Monocytes Relative: 4.5 % (ref 3.0–12.0)
Neutro Abs: 3.3 10*3/uL (ref 1.4–7.7)
Neutrophils Relative %: 67 % (ref 43.0–77.0)
Platelets: 342 10*3/uL (ref 150.0–400.0)
RBC: 4.14 Mil/uL (ref 3.87–5.11)
RDW: 16.4 % — ABNORMAL HIGH (ref 11.5–15.5)
WBC: 5 10*3/uL (ref 4.0–10.5)

## 2020-07-21 LAB — LIPID PANEL
Cholesterol: 242 mg/dL — ABNORMAL HIGH (ref 0–200)
HDL: 67.7 mg/dL (ref >39.00)
LDL Cholesterol: 162 mg/dL — ABNORMAL HIGH (ref 0–99)
NonHDL: 173.8
Total CHOL/HDL Ratio: 4
Triglycerides: 57 mg/dL (ref 0.0–149.0)
VLDL: 11.4 mg/dL (ref 0.0–40.0)

## 2020-07-21 LAB — IRON: Iron: 43 ug/dL (ref 42–145)

## 2020-07-21 LAB — TSH: TSH: 1.21 u[IU]/mL (ref 0.35–4.50)

## 2020-07-21 LAB — HEMOGLOBIN A1C: Hgb A1c MFr Bld: 5.7 % (ref 4.6–6.5)

## 2020-07-21 NOTE — Addendum Note (Signed)
Addended by: Alvina Chou on: 07/21/2020 08:40 AM   Modules accepted: Orders

## 2020-07-22 LAB — RPR: RPR Ser Ql: NONREACTIVE

## 2020-07-22 LAB — HEPATITIS C ANTIBODY
Hepatitis C Ab: NONREACTIVE
SIGNAL TO CUT-OFF: 0.01 (ref <1.00)

## 2020-07-22 LAB — HIV ANTIBODY (ROUTINE TESTING W REFLEX): HIV 1&2 Ab, 4th Generation: NONREACTIVE

## 2020-07-28 ENCOUNTER — Encounter: Payer: Self-pay | Admitting: Family Medicine

## 2020-07-28 ENCOUNTER — Ambulatory Visit: Payer: PRIVATE HEALTH INSURANCE | Admitting: Family Medicine

## 2020-07-28 ENCOUNTER — Other Ambulatory Visit: Payer: Self-pay

## 2020-07-28 VITALS — BP 122/74 | HR 81 | Temp 97.2°F | Ht 62.75 in | Wt 117.1 lb

## 2020-07-28 DIAGNOSIS — Z0000 Encounter for general adult medical examination without abnormal findings: Secondary | ICD-10-CM

## 2020-07-28 DIAGNOSIS — Z113 Encounter for screening for infections with a predominantly sexual mode of transmission: Secondary | ICD-10-CM

## 2020-07-28 DIAGNOSIS — E785 Hyperlipidemia, unspecified: Secondary | ICD-10-CM

## 2020-07-28 DIAGNOSIS — D500 Iron deficiency anemia secondary to blood loss (chronic): Secondary | ICD-10-CM | POA: Diagnosis not present

## 2020-07-28 DIAGNOSIS — F329 Major depressive disorder, single episode, unspecified: Secondary | ICD-10-CM

## 2020-07-28 DIAGNOSIS — R7303 Prediabetes: Secondary | ICD-10-CM

## 2020-07-28 DIAGNOSIS — Z1211 Encounter for screening for malignant neoplasm of colon: Secondary | ICD-10-CM | POA: Insufficient documentation

## 2020-07-28 MED ORDER — SERTRALINE HCL 100 MG PO TABS
100.0000 mg | ORAL_TABLET | Freq: Every day | ORAL | 3 refills | Status: DC
Start: 2020-07-28 — End: 2021-09-27

## 2020-07-28 MED ORDER — LEVONORGESTREL-ETHINYL ESTRAD 0.1-20 MG-MCG PO TABS: 1.0000 | ORAL_TABLET | Freq: Every day | ORAL | 3 refills | Status: DC

## 2020-07-28 NOTE — Patient Instructions (Addendum)
Don't forget to schedule your mammogram   Get your gyn visit scheduled as well   For cholesterol Avoid red meat/ fried foods/ egg yolks/ fatty breakfast meats/ butter, cheese and high fat dairy/ and shellfish    Let's re check in 2-3 months   Get a multi vitamin with iron and take it daily   Gradually get back to exercise

## 2020-07-28 NOTE — Assessment & Plan Note (Signed)
Negative HIV , RPR and hep C screen  Pending gc/chlamydia test  Enc safe sexual practices

## 2020-07-28 NOTE — Assessment & Plan Note (Signed)
Disc goals for lipids and reasons to control them Rev last labs with pt Rev low sat fat diet in detail Not eating well lately and plans to change diet  Plan to re check lipid in 2-3 mo

## 2020-07-28 NOTE — Progress Notes (Signed)
Subjective:    Patient ID: Kara [name], female    DOB: 1971/04/24, 50 y.o.   MRN: [id number]  This visit occurred during the SARS-CoV-2 public health emergency.  Safety protocols were in place, including screening questions prior to the visit, additional usage of staff PPE, and extensive cleaning of exam room while observing appropriate contact time as indicated for disinfecting solutions.    HPI Here for health maintenance exam and to review chronic medical problems    Wt Readings from Last 3 Encounters:  07/28/20 117 lb 2 oz (53.1 kg)  07/28/19 116 lb 8 oz (52.8 kg)  05/05/18 113 lb (51.3 kg)   20.91 kg/m  Feeling pretty good  Taking fair care of herself  Diet and exercise not as good with the pandemic  (this is hard)  Lifestyle has changed  Slowly started back with yoga   covid status -immunized with booster   Colon cancer screen- will do ifob kit   Mammogram  9/19-has not had one recently and had her implants out and feels better That was done late oct  Self breast exam-no lumps  M aunt had breast cancer  Pap 11/19 -neg with positive HPV Had leep procedure in the past Needs to follow up  Sees Dr Arvid Right heavy and now getting lighter  Some hot flashes   Flu shot 10/21 Tdap 2/14  STD screen HIV neg RPR neg Hep C neg   Wants to do gc/chlam today  No symptoms  No known exposure    Prediabetes Lab Results  Component Value Date   HGBA1C 5.7 07/21/2020   Hyperlipidemia  Lab Results  Component Value Date   CHOL 242 (H) 07/21/2020   CHOL 220 (H) 07/24/2019   CHOL 204 (H) 04/21/2018   Lab Results  Component Value Date   HDL 67.70 07/21/2020   HDL 72.90 07/24/2019   HDL 73 04/21/2018   Lab Results  Component Value Date   LDLCALC 162 (H) 07/21/2020   LDLCALC 133 (H) 07/24/2019   LDLCALC 118 (H) 04/21/2018   Lab Results  Component Value Date   TRIG 57.0 07/21/2020   TRIG 70.0 07/24/2019   TRIG 67 04/21/2018   Lab Results   Component Value Date   CHOLHDL 4 07/21/2020   CHOLHDL 3 07/24/2019   CHOLHDL 2.8 04/21/2018   Lab Results  Component Value Date   LDLDIRECT 141.4 07/10/2013  not eating as well  Fast food and fried food and butter and cheese  Mother is on cholesterol medicine   Iron def anemia  Lab Results  Component Value Date   WBC 5.0 07/21/2020   HGB 11.0 (L) 07/21/2020   HCT 34.1 (L) 07/21/2020   MCV 82.3 07/21/2020   PLT 342.0 07/21/2020   Lab Results  Component Value Date   FERRITIN 6.0 (L) 07/21/2020   Iron level 43 -low nl   Mood Taking zoloft 100 mg daily-doing well   Patient Active Problem List   Diagnosis Date Noted  . Colon cancer screening 07/28/2020  . Mild hyperlipidemia 07/28/2019  . Cervical intraepithelial neoplasia grade III with severe dysplasia 07/18/2018  . 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  . Encounter for annual routine gynecological examination 05/05/2018  . Prediabetes 05/05/2018  . Iron deficiency anemia 05/05/2018  . Abnormal mammogram of left breast 03/24/2018  . Premenstrual dysphoric disorder 02/12/2018  . Screen for STD (sexually transmitted disease) 02/12/2018  . Allergic rhinitis 02/12/2018  . External hemorrhoids 02/12/2018  . Routine  general medical examination at a health care facility 07/15/2012  . Depression 04/09/2011   Past Medical History:  Diagnosis Date  . Depression   . Mitral valve prolapse    History reviewed. No pertinent surgical history. Social History   Tobacco Use  . Smoking status: Never Smoker  . Smokeless tobacco: Never Used  Substance Use Topics  . Alcohol use: Yes    Alcohol/week: 0.0 standard drinks    Comment: occasional  . Drug use: Never   Family History  Problem Relation Age of Onset  . Cancer Maternal Aunt 55       breast   Allergies  Allergen Reactions  . Sulfa Antibiotics Shortness Of Breath   Current Outpatient Medications on File Prior to Visit  Medication Sig Dispense Refill  . ibuprofen  (ADVIL) 800 MG tablet Take 1 tablet (800 mg total) by mouth 3 (three) times daily. 21 tablet 0  . sodium chloride (OCEAN) 0.65 % SOLN nasal spray Place 2 sprays into both nostrils every 2 (two) hours while awake for 30 days.  0  . cetirizine (ZYRTEC) 10 MG tablet Take 1 tablet (10 mg total) by mouth daily. Take by mouth daily as needed (Patient not taking: Reported on 07/28/2020) 30 tablet 11  . diphenhydrAMINE (BENADRYL) 25 MG tablet Take by mouth as directed as needed.  (Patient not taking: Reported on 07/28/2020)    . fluticasone (FLONASE) 50 MCG/ACT nasal spray Place 2 sprays into both nostrils daily. (Patient not taking: No sig reported) 16 g 6  . hydrocortisone (ANUSOL-HC) 2.5 % rectal cream Place 1 application rectally 2 (two) times daily as needed for hemorrhoids or anal itching. (Patient not taking: No sig reported) 30 g 1   No current facility-administered medications on file prior to visit.    Review of Systems  Constitutional: Negative for activity change, appetite change, fatigue, fever and unexpected weight change.  HENT: Negative for congestion, ear pain, rhinorrhea, sinus pressure and sore throat.   Eyes: Negative for pain, redness and visual disturbance.  Respiratory: Negative for cough, shortness of breath and wheezing.   Cardiovascular: Negative for chest pain and palpitations.  Gastrointestinal: Negative for abdominal pain, blood in stool, constipation and diarrhea.  Endocrine: Negative for polydipsia and polyuria.  Genitourinary: Negative for dysuria, frequency and urgency.  Musculoskeletal: Negative for arthralgias, back pain and myalgias.  Skin: Negative for pallor and rash.  Allergic/Immunologic: Negative for environmental allergies.  Neurological: Negative for dizziness, syncope and headaches.  Hematological: Negative for adenopathy. Does not bruise/bleed easily.  Psychiatric/Behavioral: Negative for decreased concentration and dysphoric mood. The patient is not  nervous/anxious.        Objective:   Physical Exam Constitutional:      General: She is not in acute distress.    Appearance: Normal appearance. She is well-developed and normal weight. She is not ill-appearing or diaphoretic.  HENT:     Head: Normocephalic and atraumatic.     Right Ear: Tympanic membrane, ear canal and external ear normal.     Left Ear: Tympanic membrane, ear canal and external ear normal.     Nose: Nose normal. No congestion.     Mouth/Throat:     Mouth: Mucous membranes are moist.     Pharynx: Oropharynx is clear. No posterior oropharyngeal erythema.  Eyes:     General: No scleral icterus.    Extraocular Movements: Extraocular movements intact.     Conjunctiva/sclera: Conjunctivae normal.     Pupils: Pupils are equal, round, and reactive  to light.  Neck:     Thyroid: No thyromegaly.     Vascular: No carotid bruit or JVD.  Cardiovascular:     Rate and Rhythm: Normal rate and regular rhythm.     Pulses: Normal pulses.     Heart sounds: Normal heart sounds. No gallop.   Pulmonary:     Effort: Pulmonary effort is normal. No respiratory distress.     Breath sounds: Normal breath sounds. No wheezing.     Comments: Good air exch Chest:     Chest wall: No tenderness.  Abdominal:     General: Bowel sounds are normal. There is no distension or abdominal bruit.     Palpations: Abdomen is soft. There is no mass.     Tenderness: There is no abdominal tenderness.     Hernia: No hernia is present.  Genitourinary:    Comments: Breast exam: No mass, nodules, thickening, tenderness, bulging, retraction, inflamation, nipple discharge or skin changes noted.  No axillary or clavicular LA.     Musculoskeletal:        General: No tenderness. Normal range of motion.     Cervical back: Normal range of motion and neck supple. No rigidity. No muscular tenderness.     Right lower leg: No edema.     Left lower leg: No edema.  Lymphadenopathy:     Cervical: No cervical  adenopathy.  Skin:    General: Skin is warm and dry.     Coloration: Skin is not pale.     Findings: No erythema or rash.     Comments: Solar lentigines diffusely   Neurological:     Mental Status: She is alert. Mental status is at baseline.     Cranial Nerves: No cranial nerve deficit.     Motor: No abnormal muscle tone.     Coordination: Coordination normal.     Gait: Gait normal.     Deep Tendon Reflexes: Reflexes are normal and symmetric. Reflexes normal.  Psychiatric:        Mood and Affect: Mood normal.        Cognition and Memory: Cognition and memory normal.           Assessment & Plan:   Problem List Items Addressed This Visit      Other   Depression    Stable/doing well with zoloft 100 mg daily  Wants to continue this      Relevant Medications   sertraline (ZOLOFT) 100 MG tablet   Routine general medical examination at a health care facility - Primary    Reviewed health habits including diet and exercise and skin cancer prevention Reviewed appropriate screening tests for age  Also reviewed health mt list, fam hx and immunization status , as well as social and family history   See HPI Labs reviewed  covid immunized  Neg std screen pending gc/chlam test  Plans to schedule mammogram and gyn f/u (h/o HPV) Disc mvi with iron for anemia Better diet for cholesterol      Screen for STD (sexually transmitted disease)    Negative HIV , RPR and hep C screen  Pending gc/chlamydia test  Enc safe sexual practices      Relevant Orders   C. trachomatis/N. gonorrhoeae RNA   Prediabetes    Lab Results  Component Value Date   HGBA1C 5.7 07/21/2020   disc imp of low glycemic diet and wt loss to prevent DM2       Iron deficiency anemia  Hb of 11.0  Low ferritin  Adv mvi with iron until menopause  Still having periods       Mild hyperlipidemia    Disc goals for lipids and reasons to control them Rev last labs with pt Rev low sat fat diet in detail Not  eating well lately and plans to change diet  Plan to re check lipid in 2-3 mo       Colon cancer screening    ifob kit given  Will consider colonoscopy at 50 yo      Relevant Orders   Fecal occult blood, imunochemical

## 2020-07-28 NOTE — Assessment & Plan Note (Signed)
ifob kit given  Will consider colonoscopy at 50 yo

## 2020-07-28 NOTE — Assessment & Plan Note (Signed)
Lab Results  Component Value Date   HGBA1C 5.7 07/21/2020   disc imp of low glycemic diet and wt loss to prevent DM2

## 2020-07-28 NOTE — Assessment & Plan Note (Signed)
Reviewed health habits including diet and exercise and skin cancer prevention Reviewed appropriate screening tests for age  Also reviewed health mt list, fam hx and immunization status , as well as social and family history   See HPI Labs reviewed  covid immunized  Neg std screen pending gc/chlam test  Plans to schedule mammogram and gyn f/u (h/o HPV) Disc mvi with iron for anemia Better diet for cholesterol

## 2020-07-28 NOTE — Assessment & Plan Note (Signed)
Hb of 11.0  Low ferritin  Adv mvi with iron until menopause  Still having periods

## 2020-07-28 NOTE — Assessment & Plan Note (Signed)
Stable/doing well with zoloft 100 mg daily  Wants to continue this

## 2020-07-29 LAB — C. TRACHOMATIS/N. GONORRHOEAE RNA
C. trachomatis RNA, TMA: NOT DETECTED
N. gonorrhoeae RNA, TMA: NOT DETECTED

## 2020-08-06 DIAGNOSIS — I341 Nonrheumatic mitral (valve) prolapse: Secondary | ICD-10-CM | POA: Insufficient documentation

## 2020-08-06 DIAGNOSIS — Z8659 Personal history of other mental and behavioral disorders: Secondary | ICD-10-CM | POA: Insufficient documentation

## 2020-09-20 ENCOUNTER — Encounter: Payer: Self-pay | Admitting: Internal Medicine

## 2020-09-20 ENCOUNTER — Other Ambulatory Visit: Payer: Self-pay

## 2020-09-20 ENCOUNTER — Ambulatory Visit: Payer: PRIVATE HEALTH INSURANCE | Admitting: Internal Medicine

## 2020-09-20 VITALS — BP 122/74 | HR 73 | Temp 98.2°F | Wt 116.0 lb

## 2020-09-20 DIAGNOSIS — Z803 Family history of malignant neoplasm of breast: Secondary | ICD-10-CM

## 2020-09-20 DIAGNOSIS — Z9886 Personal history of breast implant removal: Secondary | ICD-10-CM

## 2020-09-20 DIAGNOSIS — N6310 Unspecified lump in the right breast, unspecified quadrant: Secondary | ICD-10-CM | POA: Diagnosis not present

## 2020-09-20 NOTE — Patient Instructions (Signed)

## 2020-09-20 NOTE — Progress Notes (Signed)
Subjective:    Patient ID: Kara [name], female    DOB: [birth year], 50 y.o.   MRN: [id number]  HPI  Pt presents to the clinic today with c/o a painful mass in her right breast. She noticed this 2 days ago. She had breast implants removed in September. She denies lumps in the axilla, changes in the skin of the breast or discharge from the nipple discharge. Her last mammogram was in 2019.  Review of Systems      Past Medical History:  Diagnosis Date  . Depression   . Mitral valve prolapse     Current Outpatient Medications  Medication Sig Dispense Refill  . cetirizine (ZYRTEC) 10 MG tablet Take 1 tablet (10 mg total) by mouth daily. Take by mouth daily as needed (Patient not taking: Reported on 07/28/2020) 30 tablet 11  . diphenhydrAMINE (BENADRYL) 25 MG tablet Take by mouth as directed as needed.  (Patient not taking: Reported on 07/28/2020)    . fluticasone (FLONASE) 50 MCG/ACT nasal spray Place 2 sprays into both nostrils daily. (Patient not taking: No sig reported) 16 g 6  . hydrocortisone (ANUSOL-HC) 2.5 % rectal cream Place 1 application rectally 2 (two) times daily as needed for hemorrhoids or anal itching. (Patient not taking: No sig reported) 30 g 1  . ibuprofen (ADVIL) 800 MG tablet Take 1 tablet (800 mg total) by mouth 3 (three) times daily. 21 tablet 0  . levonorgestrel-ethinyl estradiol (SRONYX) 0.1-20 MG-MCG tablet Take 1 tablet by mouth daily. 84 tablet 3  . sertraline (ZOLOFT) 100 MG tablet Take 1 tablet (100 mg total) by mouth daily. 90 tablet 3  . sodium chloride (OCEAN) 0.65 % SOLN nasal spray Place 2 sprays into both nostrils every 2 (two) hours while awake for 30 days.  0   No current facility-administered medications for this visit.    Allergies  Allergen Reactions  . Sulfa Antibiotics Shortness Of Breath    Family History  Problem Relation Age of Onset  . Cancer Maternal Aunt 33       breast    Social History   Socioeconomic History  . Marital  status: Married    Spouse name: Not on file  . Number of children: 1  . Years of education: Not on file  . Highest education level: Not on file  Occupational History  . 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 Smoking status: Never Smoker  . Smokeless tobacco: Never Used  Substance and Sexual Activity  . Alcohol use: Yes    Alcohol/week: 0.0 standard drinks    Comment: occasional  . Drug use: Never  . Sexual activity: Not on file  Other Topics Concern  . Not on file  Social History Narrative  . Not on file   Social Determinants of Health   Financial Resource Strain: Not on file  Food Insecurity: Not on file  Transportation Needs: Not on file  Physical Activity: Not on file  Stress: Not on file  Social Connections: Not on file  Intimate Partner Violence: Not on file     Constitutional: Denies fever, malaise, fatigue, headache or abrupt weight changes.  Respiratory: Denies difficulty breathing, shortness of breath, cough or sputum production.   Cardiovascular: Denies chest pain, chest tightness, palpitations or swelling in the hands or feet.  Skin: Pt reports mass of right breast. Denies redness, rashes,  or ulcercations.    No other specific complaints in a complete review of systems (  except as listed in HPI above).  Objective:   Physical Exam  BP 122/74   Pulse 73   Temp 98.2 F (36.8 C) (Temporal)   Wt 116 lb (52.6 kg)   LMP 09/04/2020   SpO2 98%   BMI 20.71 kg/m   Wt Readings from Last 3 Encounters:  07/28/20 117 lb 2 oz (53.1 kg)  07/28/19 116 lb 8 oz (52.8 kg)  05/05/18 113 lb (51.3 kg)    General: Appears her stated age, well developed, well nourished in NAD. Breasts: Symmetrical. She does not have much breast tissue. 1.5 cm irregularly shaped mass palplable starting a 6 oclock at the areola, extending linear down into the breast tissue. No axillary lymphadenopathy. No nipple discharge present. Cardiovascular: Normal  rate. Pulmonary/Chest: Normal effort. Neurological: Alert and oriented.  BMET    Component Value Date/Time   NA 140 07/21/2020 0833   NA 142 04/21/2018 0850   K 4.5 07/21/2020 0833   CL 105 07/21/2020 0833   CO2 28 07/21/2020 0833   GLUCOSE 90 07/21/2020 0833   BUN 15 07/21/2020 0833   BUN 13 04/21/2018 0850   CREATININE 0.80 07/21/2020 0833   CALCIUM 8.8 07/21/2020 0833   GFRNONAA 73 04/21/2018 0850   GFRAA 84 04/21/2018 0850    Lipid Panel     Component Value Date/Time   CHOL 242 (H) 07/21/2020 0833   CHOL 204 (H) 04/21/2018 0850   TRIG 57.0 07/21/2020 0833   HDL 67.70 07/21/2020 0833   HDL 73 04/21/2018 0850   CHOLHDL 4 07/21/2020 0833   VLDL 11.4 07/21/2020 0833   LDLCALC 162 (H) 07/21/2020 0833   LDLCALC 118 (H) 04/21/2018 0850    CBC    Component Value Date/Time   WBC 5.0 07/21/2020 0833   RBC 4.14 07/21/2020 0833   HGB 11.0 (L) 07/21/2020 0833   HGB 10.8 (L) 04/21/2018 0850   HCT 34.1 (L) 07/21/2020 0833   HCT 34.2 04/21/2018 0850   PLT 342.0 07/21/2020 0833   PLT 367 04/21/2018 0850   MCV 82.3 07/21/2020 0833   MCV 84 04/21/2018 0850   MCH 26.5 (L) 04/21/2018 0850   MCHC 32.3 07/21/2020 0833   RDW 16.4 (H) 07/21/2020 0833   RDW 14.0 04/21/2018 0850   LYMPHSABS 1.3 07/21/2020 0833   LYMPHSABS 1.2 04/21/2018 0850   MONOABS 0.2 07/21/2020 0833   EOSABS 0.1 07/21/2020 0833   EOSABS 0.2 04/21/2018 0850   BASOSABS 0.0 07/21/2020 0833   BASOSABS 0.1 04/21/2018 0850    Hgb A1C Lab Results  Component Value Date   HGBA1C 5.7 07/21/2020           Assessment & Plan:  Mass of Right Breast, Family History of Breast Cancer, History of Removal of Breast Implants:  Will obtain diagnostic mammogram of right breast US bilateral breasts ordered  Return precautions discussed  Nicki Reaper, NP This visit occurred during the SARS-CoV-2 public health emergency.  Safety protocols were in place, including screening questions prior to the visit,  additional usage of staff PPE, and extensive cleaning of exam room while observing appropriate contact time as indicated for disinfecting solutions.

## 2020-09-27 ENCOUNTER — Other Ambulatory Visit: Payer: Self-pay

## 2020-09-27 ENCOUNTER — Encounter: Payer: Self-pay | Admitting: Registered Nurse

## 2020-09-27 ENCOUNTER — Ambulatory Visit: Payer: Self-pay | Admitting: Registered Nurse

## 2020-09-27 VITALS — BP 126/81 | HR 73 | Temp 98.7°F

## 2020-09-27 DIAGNOSIS — S060X0A Concussion without loss of consciousness, initial encounter: Secondary | ICD-10-CM

## 2020-09-27 DIAGNOSIS — S0083XA Contusion of other part of head, initial encounter: Secondary | ICD-10-CM

## 2020-09-27 MED ORDER — CETIRIZINE HCL 10 MG PO TABS: 10.0000 mg | ORAL_TABLET | Freq: Every day | ORAL | 11 refills | Status: DC

## 2020-09-27 MED ORDER — ACETAMINOPHEN 500 MG PO TABS: 1000.0000 mg | ORAL_TABLET | Freq: Four times a day (QID) | ORAL | 0 refills | Status: AC | PRN

## 2020-09-27 NOTE — Patient Instructions (Signed)
Contusion A contusion is a deep bruise. This is a result of an injury that causes bleeding under the skin. Symptoms of bruising include pain, swelling, and discolored skin. The skin may turn blue, purple, or yellow. Follow these instructions at home: Managing pain, stiffness, and swelling You may use RICE. This stands for:  Resting.  Icing.  Compression, or putting pressure.  Elevating, or raising the injured area. To follow this method, do these actions:  Rest the injured area.  If told, put ice on the injured area. ? Put ice in a plastic bag. ? Place a towel between your skin and the bag. ? Leave the ice on for 20 minutes, 2-3 times per day.  If told, put light pressure (compression) on the injured area using an elastic bandage. Make sure the bandage is not too tight. If the area tingles or becomes numb, remove it and put it back on as told by your doctor.  If possible, raise (elevate) the injured area above the level of your heart while you are sitting or lying down.   General instructions  Take over-the-counter and prescription medicines only as told by your doctor.  Keep all follow-up visits as told by your doctor. This is important. Contact a doctor if:  Your symptoms do not get better after several days of treatment.  Your symptoms get worse.  You have trouble moving the injured area. Get help right away if:  You have very bad pain.  You have a loss of feeling (numbness) in a hand or foot.  Your hand or foot turns pale or cold. Summary  A contusion is a deep bruise. This is a result of an injury that causes bleeding under the skin.  Symptoms of bruising include pain, swelling, and discolored skin. The skin may turn blue, purple, or yellow.  This condition is treated with rest, ice, compression, and elevation. This is also called RICE. You may be given over-the-counter medicines for pain.  Contact a doctor if you do not feel better, or you feel worse. Get  help right away if you have very bad pain, have lost feeling in a hand or foot, or the area turns pale or cold. This information is not intended to replace advice given to you by your health care provider. Make sure you discuss any questions you have with your health care provider. Document Revised: 01/17/2018 Document Reviewed: 01/17/2018 Elsevier Patient Education  2021 [employer] Concussion, Adult  A concussion is a brain injury from a hard, direct hit (trauma) to your head or body. This direct hit causes your brain to quickly shake back and forth inside your skull. A concussion may also be called a mild traumatic brain injury (TBI). Healing from this injury can take time. What are the causes? This condition is caused by:  A direct hit to your head, such as: ? Running into a player during a game. ? Being hit in a fight. ? Hitting your head on a hard surface.  A quick and sudden movement of the head or neck, such as in a car crash. What are the signs or symptoms? The signs of a concussion can be hard to notice. They may be missed by you, family members, and doctors. You may look fine on the outside but may not act or feel normal. Physical symptoms  Headaches.  Being dizzy.  Problems with body balance.  Being sensitive to light or noise.  Vomiting or feeling like you may vomit.  Being tired.  Problems seeing or hearing.  Not sleeping or eating as you used to.  Seizure. Mental and emotional symptoms  Feeling grouchy (irritable).  Having mood changes.  Problems remembering things.  Trouble focusing your mind (concentrating), organizing, or making decisions.  Being slow to think, act, react, speak, or read.  Feeling worried or nervous (anxious).  Feeling sad (depressed). How is this treated? This condition may be treated by:  Stopping sports or activity if you are injured. If you hit your head or have signs of concussion: ? Do not return to sports or  activities the same day. ? Get checked by a doctor before you return to your activities.  Resting your body and your mind.  Being watched carefully, often at home.  Medicines to help with symptoms such as: ? Headaches. ? Feeling like you may vomit. ? Problems with sleep.  Avoiding alcohol and drugs.  Being asked to go to a concussion clinic or a place to help you recover (rehabilitation center). Recovery from a concussion can take time. Return to activities only:  When you are fully healed.  When your doctor says it is safe. Avoid taking strong pain medicines (opioids) for a concussion. Follow these instructions at home: Activity  Limit activities that need a lot of thought or focus, such as: ? Homework or work for your job. ? Watching TV. ? Using the computer or phone. ? Playing memory games and puzzles.  Rest. Rest helps your brain heal. Make sure you: ? Get plenty of sleep. Most adults should get 7-9 hours of sleep each night. ? Rest during the day. Take naps or breaks when you feel tired.  Avoid activity like exercise until your doctor says its safe. Stop any activity that makes symptoms worse.  Do not do activities that could cause a second concussion, such as riding a bike or playing sports.  Ask your doctor when you can return to your normal activities, such as school, work, sports, and driving. Your ability to react may be slower. Do not do these activities if you are dizzy. General instructions  Take over-the-counter and prescription medicines only as told by your doctor.  Do not drink alcohol until your doctor says you can.  Watch your symptoms and tell other people to do the same. Other problems can occur after a concussion. Older adults have a higher risk of serious problems.  Tell your work Production designer, theatre/television/film, teachers, Tax adviser, school counselor, coach, or Event organiser about your injury and symptoms. Tell them about what you can or cannot do.  Keep all  follow-up visits as told by your doctor. This is important.   How is this prevented? It is very important that you do not get another brain injury. In rare cases, another injury can cause brain damage that will not go away, brain swelling, or death. The risk of this is greatest in the first 7-10 days after a head injury. To avoid injuries:  Stop activities that could lead to a second concussion, such as contact sports, until your doctor says it is okay.  When you return to sports or activities: ? Do not crash into other players. This is how most concussions happen. ? Follow the rules. ? Respect other players. Do not engage in violent behavior while playing.  Get regular exercise. Do strength and balance training.  Wear a helmet that fits you well during sports, biking, or other activities.  Helmets can help protect you from serious skull and brain injuries, but they  do not protect you from a concussion. Even when wearing a helmet, you should avoid being hit in the head. Contact a doctor if:  Your symptoms do not get better.  You have new symptoms.  You have another injury. Get help right away if:  You have bad headaches or your headaches get worse.  You feel weak or numb in any part of your body.  You feel mixed up (confused).  Your balance gets worse.  You vomit often.  You feel more sleepy than normal.  You cannot speak well, or have slurred speech.  You have a seizure.  Others have trouble waking you up.  You have changes in how you act.  You have changes in how you see (vision).  You pass out (lose consciousness). These symptoms may be an emergency. Do not wait to see if the symptoms will go away. Get medical help right away. Call your local emergency services (911 in the [place] Do not drive yourself to the hospital. Summary  A concussion is a brain injury from a hard, direct hit (trauma) to your head or body.  This condition is treated with rest and careful  watching of symptoms.  Ask your doctor when you can return to your normal activities, such as school, work, or driving.  Get help right away if you have a very bad headache, feel weak in any part of your body, have a seizure, have changes in how you act or see, or if you are mixed up or more sleepy than normal. This information is not intended to replace advice given to you by your health care provider. Make sure you discuss any questions you have with your health care provider. Document Revised: 04/09/2019 Document Reviewed: 04/09/2019 Elsevier Patient Education  2021 [employer].

## 2020-09-27 NOTE — Progress Notes (Signed)
Subjective:    Patient ID: Kara [name], female    DOB: 01-31-1971, 50 y.o.   MRN: [id number]  49y/o caucasian established patient here for evaluation after metal bed frame hit her face and head 09/25/20 at 2000.  Has been applying ice, has right jaw hurting, headache, nausea, dizziness, fatigue, sensitivity to noise, feeling mentally foggy, slowed down, difficulty concentrating, irritable, more emotion, nervous and sleeping more than usual.  scalp sore and left side of face swollen and has bruise under left eye as some pieces of bed frame hit right side head and others left face.  Noticing that cognitive activity worsens symptoms. 12/22 score on ACE office form.  Denied previous concussion history or chronic headaches.  PMHx anxiety and depression.  Patient denied loss of consciousness, seizures, loss of visual fields.  Unable to wear contacts today so vision a little fuzzy and unsure if a little off her normal doesn't have glasses with her today.  Denied vomiting.     Review of Systems  Constitutional: Negative for activity change, appetite change, chills, diaphoresis and fever.  HENT: Positive for facial swelling. Negative for trouble swallowing and voice change.   Eyes: Negative for photophobia, pain, discharge, redness, itching and visual disturbance.  Respiratory: Negative for cough, shortness of breath, wheezing and stridor.   Cardiovascular: Negative for chest pain and palpitations.  Gastrointestinal: Positive for nausea. Negative for abdominal pain, diarrhea and vomiting.  Endocrine: Negative for polydipsia, polyphagia and polyuria.  Genitourinary: Negative for difficulty urinating.  Musculoskeletal: Positive for myalgias. Negative for gait problem, neck pain and neck stiffness.  Skin: Positive for color change. Negative for rash and wound.  Allergic/Immunologic: Positive for environmental allergies. Negative for food allergies.  Neurological: Positive for dizziness and headaches.  Negative for tremors, seizures, syncope, facial asymmetry, speech difficulty, weakness, light-headedness and numbness.  Hematological: Negative for adenopathy. Does not bruise/bleed easily.  Psychiatric/Behavioral: Negative for agitation, confusion and sleep disturbance.       Objective:   Physical Exam Vitals and nursing note reviewed.  Constitutional:      General: She is awake. She is not in acute distress.    Appearance: She is well-developed, well-groomed and normal weight. She is not ill-appearing, toxic-appearing or diaphoretic.  HENT:     Head: Normocephalic. Contusion present. No raccoon eyes, Battle's sign, abrasion, masses, right periorbital erythema, left periorbital erythema or laceration. Hair is normal.     Jaw: There is normal jaw occlusion. Tenderness and pain on movement present. No trismus or malocclusion.     Salivary Glands: Right salivary gland is not diffusely enlarged or tender. Left salivary gland is not diffusely enlarged or tender.      Right Ear: Hearing and external ear normal.     Left Ear: Hearing and external ear normal.     Nose: No nasal deformity, signs of injury, laceration, nasal tenderness, congestion or rhinorrhea.     Right Nostril: No epistaxis.     Left Nostril: No epistaxis.     Right Sinus: No maxillary sinus tenderness or frontal sinus tenderness.     Left Sinus: Maxillary sinus tenderness present. No frontal sinus tenderness.     Mouth/Throat:     Lips: Pink. No lesions.     Mouth: Mucous membranes are moist. No injury, lacerations, oral lesions or angioedema.     Dentition: No gum lesions.     Tongue: No lesions. Tongue does not deviate from midline.     Palate: No mass and lesions.  Pharynx: Oropharynx is clear. Uvula midline.  Eyes:     General: Lids are normal. Vision grossly intact. Gaze aligned appropriately. Allergic shiner present. No visual field deficit or scleral icterus.       Right eye: No discharge.        Left eye: No  discharge.     Extraocular Movements: Extraocular movements intact.     Right eye: Normal extraocular motion and no nystagmus.     Left eye: Normal extraocular motion and no nystagmus.     Conjunctiva/sclera: Conjunctivae normal.     Right eye: Right conjunctiva is not injected. No chemosis, exudate or hemorrhage.    Left eye: Left conjunctiva is not injected. No chemosis, exudate or hemorrhage.    Pupils: Pupils are equal, round, and reactive to light.  Neck:     Trachea: Trachea and phonation normal. No tracheal deviation.  Cardiovascular:     Rate and Rhythm: Normal rate and regular rhythm.     Pulses: Normal pulses.          Radial pulses are 2+ on the right side and 2+ on the left side.     Heart sounds: Normal heart sounds.  Pulmonary:     Effort: Pulmonary effort is normal. No respiratory distress.     Breath sounds: Normal breath sounds and air entry. No stridor or transmitted upper airway sounds. No wheezing.     Comments: Spoke full sentences without difficulty; no cough observed in exam room Abdominal:     General: Abdomen is flat.     Palpations: Abdomen is soft.  Musculoskeletal:        General: Tenderness and signs of injury present. No swelling or deformity. Normal range of motion.     Right shoulder: No swelling, deformity, effusion or laceration. Normal range of motion.     Left shoulder: No swelling, deformity, effusion or laceration. Normal range of motion.     Right elbow: No swelling, deformity, effusion or lacerations. Normal range of motion.     Left elbow: No swelling, deformity, effusion or lacerations. Normal range of motion.     Right hand: No swelling, deformity or lacerations. Normal range of motion. Normal strength.     Left hand: No swelling, deformity or lacerations. Normal range of motion. Normal strength.     Cervical back: Normal range of motion and neck supple. No swelling, edema, deformity, erythema, signs of trauma, lacerations, rigidity,  torticollis, tenderness or crepitus. No pain with movement. Normal range of motion.     Thoracic back: No swelling, edema, deformity, signs of trauma or lacerations.     Lumbar back: No swelling, edema, deformity, signs of trauma or lacerations.     Right lower leg: No edema.     Left lower leg: No edema.  Lymphadenopathy:     Head:     Right side of head: No submental, submandibular, tonsillar, preauricular or posterior auricular adenopathy.     Left side of head: No submental, submandibular, tonsillar, preauricular or posterior auricular adenopathy.     Cervical: No cervical adenopathy.     Right cervical: No superficial or posterior cervical adenopathy.    Left cervical: No superficial or posterior cervical adenopathy.  Skin:    General: Skin is warm and dry.     Capillary Refill: Capillary refill takes less than 2 seconds.     Coloration: Skin is not ashen, cyanotic, jaundiced, mottled, pale or sallow.     Findings: Bruising, ecchymosis and signs of injury  present. No abrasion, abscess, acne, burn, erythema, laceration, lesion, petechiae, rash or wound.     Nails: There is no clubbing.          Comments: 1+/4 nonpitting edema left maxillary; no crepitus with palpation but tender; patient denied grinding/loose body sensation; pain with EOM evaluation bilaterally; scalp right crown and temporal/lateral TTP but no abrasions/bruising/hematoma noted on visual or palpation of scalp  Neurological:     General: No focal deficit present.     Mental Status: She is alert and oriented to person, place, and time. Mental status is at baseline.     GCS: GCS eye subscore is 4. GCS verbal subscore is 5. GCS motor subscore is 6.     Cranial Nerves: Cranial nerves are intact. No cranial nerve deficit, dysarthria or facial asymmetry.     Sensory: Sensation is intact. No sensory deficit.     Motor: Motor function is intact. No weakness, tremor, atrophy, abnormal muscle tone or seizure activity.      Coordination: Coordination is intact. Coordination normal.     Gait: Gait is intact. Gait normal.     Deep Tendon Reflexes: Reflexes normal.     Reflex Scores:      Brachioradialis reflexes are 2+ on the right side and 2+ on the left side.      Patellar reflexes are 4+ on the right side and 4+ on the left side.    Comments: Hyper-reflexive patellar reflexes compared to brachioradialis normal 2+/4; gait sure and steady normal heel toe in/out of chair and on/off exam table without difficulty; bilateral hand grasp and leg strength equal 5/5  Psychiatric:        Attention and Perception: Attention and perception normal.        Mood and Affect: Mood and affect normal.        Speech: Speech normal.        Behavior: Behavior normal. Behavior is cooperative.        Thought Content: Thought content normal.        Cognition and Memory: Cognition and memory normal.        Judgment: Judgment normal.           Assessment & Plan:  A-concussion without loss of consciousness, contusion facial initial visits  P-Patient physical unremarkable except for contusion/swelling today maxillary left and tenderness right scalp.  Behavior/judgement normal.  Cannot rule out facial fracture without imaging but at this time patient not requiring pain medication, has been working yesterday and today, full EOM without pain, no crepitus/loose bodies.  + ecchymosis/jaw pain.  Given copy of completed office ACE form in case she has to be seen by another provider over next 36 hours as clinic closed until 09/29/2020 0830. Supervisor Jeanie Sewer emailed regarding concussion and symptoms that require more frequent rest breaks or immediate evaluation 911/ER e.g. worst headache of life, confusion, syncope, weakness arms/legs or difficulty talking.  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  Tylenol 1000mg  po q6h prn pain given 8 UD from clinic stock.   Cryotherapy 15 minutes QID prn discussed bag frozen peas if gel ice pack too heavy. I will follow up with patient on Thursday 09/29/20. Get lots of rest, get enough sleep. Keep same bedtime weekends and weekdays. Take daytime naps or rest breaks when you feels fatigued or tired, worsening symptoms. Limit physical activity as well as activities that required a lot of thinking or concentration e.g. PE, sports practices,  weight training, running, exercising, heavy lifting; thinking or concentration e.g. classwork or job related. Red flags: headaches/neck pain that continue to worsen, seizures, drowsiness can't be awakened, repeated vomiting, slurred speech, cant recognize people or places, increasing confusion, weakness or numbness in arms or legs, unusual behavior change, increasing irritability, loss of consciousness, loss of bowel/bladder control, saddle paresthesias.  Red flags are written on bottom of ace form also.  Drink lots of fluids and eat carbs/protein to maintain blood glucose levels. As symptoms decrease you may begin to gradually return to your daily activities. If symptoms worsen or return lessen activities then try again to advance gradually. During recovery normal to feel frustrated and sad when you do not feel right and can't be as active as usual. Patient verbalized understanding of information/instructions, agreed with plan of care and had no further questions at this time.

## 2020-09-29 ENCOUNTER — Telehealth: Payer: Self-pay | Admitting: Registered Nurse

## 2020-09-29 DIAGNOSIS — S060X0D Concussion without loss of consciousness, subsequent encounter: Secondary | ICD-10-CM

## 2020-09-29 DIAGNOSIS — S0083XD Contusion of other part of head, subsequent encounter: Secondary | ICD-10-CM

## 2020-09-29 NOTE — Telephone Encounter (Signed)
Patient contacted via telephone at work today.  Patient reported she took one tylenol dose after clinic appt and did not work overtime 09/27/20 went home to rest after work and headache resolved and feeling much better. Ice prn facial/scalp.   Jaw no longer hurting today, facial swelling and bruising improved per patient today feeling well and more like herself today denied concerns. Started arnica cream to facial bruising.   Discomfort minimal and not requiring any pain medication.  Patient A&Ox3 spoke full sentences without difficulty no cough/nasal sniffing or throat clearing during 3 minute telephone call. Continue plan of care as previously discussed.  May use arnica cream prn daily. Patient verbalized understanding information/instructions, agreed with plan of care and had no further questions at this time.

## 2020-10-28 ENCOUNTER — Other Ambulatory Visit: Payer: Self-pay | Admitting: Internal Medicine

## 2020-10-28 ENCOUNTER — Other Ambulatory Visit: Payer: Self-pay

## 2020-10-28 ENCOUNTER — Ambulatory Visit
Admission: RE | Admit: 2020-10-28 | Discharge: 2020-10-28 | Disposition: A | Discharge status: Home / Self Care | Payer: PRIVATE HEALTH INSURANCE | Source: Ambulatory Visit | Attending: Internal Medicine | Admitting: Internal Medicine

## 2020-10-28 ENCOUNTER — Other Ambulatory Visit: Payer: PRIVATE HEALTH INSURANCE

## 2020-10-28 DIAGNOSIS — Z803 Family history of malignant neoplasm of breast: Secondary | ICD-10-CM

## 2020-10-28 DIAGNOSIS — N6310 Unspecified lump in the right breast, unspecified quadrant: Secondary | ICD-10-CM

## 2020-11-02 ENCOUNTER — Other Ambulatory Visit: Payer: Self-pay

## 2020-11-02 ENCOUNTER — Ambulatory Visit
Admission: RE | Admit: 2020-11-02 | Discharge: 2020-11-02 | Disposition: A | Discharge status: Home / Self Care | Payer: PRIVATE HEALTH INSURANCE | Source: Ambulatory Visit | Attending: Internal Medicine | Admitting: Internal Medicine

## 2020-11-02 DIAGNOSIS — Z803 Family history of malignant neoplasm of breast: Secondary | ICD-10-CM

## 2020-11-02 DIAGNOSIS — N6310 Unspecified lump in the right breast, unspecified quadrant: Secondary | ICD-10-CM

## 2020-11-02 HISTORY — PX: BREAST CYST ASPIRATION: SHX578

## 2020-11-08 ENCOUNTER — Telehealth: Payer: Self-pay | Admitting: *Deleted

## 2020-11-08 ENCOUNTER — Encounter: Payer: Self-pay | Admitting: *Deleted

## 2020-11-08 DIAGNOSIS — U071 COVID-19: Secondary | ICD-10-CM

## 2020-11-08 NOTE — Telephone Encounter (Signed)
RN notified by HR that pt tested positive for Covid today.  Spoke with pt by phone. She reports starting to have HA, runny nose, sore throat last night after mowing yard. Took a Benadryl last night that helped some and Sudafed this morning without much relief. Sx worsened this morning so she took a covid test and it was positive. She reports being at a wedding this past weekend and had out of state family staying with her.  Day 0 11/07/20 Day 5 10/12/20 RTW 10/13/20 or next scheduled workday of 10/14/20 with strict mask use thru Day 10 10/17/20. Home test completed 5/31.   Discussed use of Sudafed/Benadryl vs Dayquil as she reports feeling mucus in her chest that she can't clear yet.

## 2020-11-08 NOTE — Telephone Encounter (Signed)
Reviewed RN Rolly Salter note agreed with plan of care will follow up with patient via telephone tomorrow.

## 2020-11-09 NOTE — Telephone Encounter (Signed)
Patient reported took nyquil last night due to cough, head pounding.  Dayquil this morning extremely sore throat.  Esophagus coated in mucous.  Reviewed patient labs in epic has hyperlipidemia as covid risk factor would make her eligible for oral antivirals.  Patient stated she doesn't want to take antivirals at this time.  Discussed if worsening symptoms/new symptoms overnight I would recommend starting tomorrow am.  Patient to contact clinic staff if this occurs.  Discussed dayquil/nyquil use, honey, flonase with patient to help with rhinitis/cough.  Tylenol 1000mg  po q6h prn headache.  Discussed headache common with covid infection.  Patient last covid vaccine Oct 2021 as not immunocompromised not eligible for second covid booster until age 50 in December at this time.  Discussed with patient natural infection will give her 3 months of natural immunity so next consideration point September if vaccine eligibility requirements changing.  Son is staying with father since she developed covid infection.  I recommended testing for son on day 3 of symptoms or day 5 after exposure.  Son was with her and grandmother at wedding and they are both covid positive now.  Discussed with patient to check school requirements for covid testing after family member exposures/positive for covid as each school has different guidelines.  Discussed sanitizing high touch surfaces daily with chlorox/bleach/lysol and wearing mask if around other family members in same household through day 10 after testing.  Patient A&Ox3 spoke full sentences without difficulty, no cough audible during 10 minute telephone call.  Some nasal congestion noted.  Patient verbalized understanding information/instructions, agreed with plan of care and had no further questions at this time.  Consider mailing sp02 clinic loaner to patient if available.  Her mother has first sp02 loaner priority at this time.  HR notified symptomatic not cleared to return onsite  daily check ins continue.

## 2020-11-10 MED ORDER — MOLNUPIRAVIR EUA 200MG CAPSULE: 4.0000 | ORAL_CAPSULE | Freq: Two times a day (BID) | ORAL | 0 refills | Status: AC

## 2020-11-10 NOTE — Telephone Encounter (Signed)
Patient returned call stated mucous, body aches, headache, sore throat worsening and she would like to start oral covid antivirals.  Discussed I would contact [facility] [place] location to see if in stock and place Rx for her along with sending drug information to her preferred email replacements.  Discussed avoid sex x 5 days, she is not breastfeeding, use drive through to pick up medication do not go into store.  Can still use flonase, dayquil, nyquil while on antivirals.  Most common side effects GI/metallic taste in mouth.  Patient may continue motrin or tylenol for body aches/headache/sore throat.  Start flonase 1 spray each nostril BID prn rhinitis/congestion.  Clinic staff to follow up with patient tomorrow.  Patient verbalized understanding information/instructions, agreed with plan of care and had no further questions at this time.  Patient A&Ox3 spoke full sentences without difficulty nasal congestion noted during 6 minute telephone call.

## 2020-11-11 NOTE — Telephone Encounter (Signed)
Patient contacted via telephone and stated body aches decreased and congestion clearing.  Taking antivirals no side effects and still using over the counters.  Spoke full sentences without difficulty, no cough/congestion or throat clearing during 4 minute telephone call.  Discussed mask wear through day 10 will check in again on Sunday as tentative return to work onsite 11/14/20 Monday.  Patient asked how she will know if no longer shedding virus and discussed if home test negative day 10 or later then no longer detectable viral shedding.  Patient doesn't want son to return home until she isn't shedding.  School didn't require son to be tested and he is asymptomatic per spouse.  Patient sounding more energetic on phone today.  Discussed with patient 20% of patients taking antivirals may have symptoms after dosing stopped.  Patient verbalized understanding information/instructions, agreed with plan of care and had no further questions at this time.  HR notified tentative return to work 11/14/20.

## 2020-11-13 NOTE — Telephone Encounter (Signed)
Patient contacted via telephone and reported she had coughing fit last night woke her up.  Post nasal drip/congestion and headache continue but otherwise doing better than initially.  Took nap today and feels like a lot of mucous in her esophagus still.  Rare coughing during day today.  Patient feels she needs another day off work for recovery still/not ready to return tomorrow.  Discussed with patient RN Rolly Salter will check in with her tomorrow.  If after 10 days of quarantine still having fatigue/headache; not feeling able to work a full shift will need to contact PCM for work restrictions. Discussed with patient congestion nasal/ fatigue/ cough & headache are common symptoms within first 10 days of covid.  Patient A&Ox3 spoke full sentences without difficulty; no throat clearing/cough during 3 minute telephone call. Patient denied shortness of breath/chest pain/dyspnea. HR notified not cleared to return onsite tomorrow and RN Rolly Salter will check in with patient Monday.   Patient verbalized understanding information/instructions, agreed with plan of care and had no further questions at this time.

## 2020-11-14 NOTE — Telephone Encounter (Signed)
Patient contacted via telephone feeling better today and ready to return to work tomorrow.  Patient concerned about working with the public and not at day 10 yet.  Discussed with patient viral shedding decreases as days of infection progress.  As long as no new or worsening symptoms may return to work with strict mast wear through day 10.  Discussed frequent hand washing, not removing mask at work indoors, avoiding touching face/mucous membranes and not hand washing.  Patient stated she will have other employees interact with public while she is on strict mask wear.  Will notify HR patient feeling better today symptoms improved and cleared to return onsite tomorrow 11/15/20.  Patient A&Ox3 spoke full sentences without difficulty;  No cough/congestion/throat clearing noted during 2 minute telephone call.  Patient verbalized understanding information/instructions, agreed with plan of care and had no further questions at this time.  HR notified patient cleared to return onsite and strict mask use through 11/17/20 and no eating in employee lunch room.

## 2020-11-15 NOTE — Telephone Encounter (Signed)
Noted patient returned to work as expected.

## 2020-11-15 NOTE — Telephone Encounter (Signed)
Pt returned RN call from this morning with email reporting that she did RTW today as expected.

## 2020-11-23 NOTE — Telephone Encounter (Signed)
Patient contacted via telephone past day 10 of quarantine has been working without difficulty.  Cough still bothering her; at times productive.  Using dayquil/nyquil.  Bromphed Rx not working as well so quit using it.  She is wondering if because it is a couple years old it has lost potency.  Discussed medications do lose potency over time.  Stopped flonase and nasal saline.  Benadryl at bedtime prn.  Discussed with patient could do Rx for tessalon pearles or can schedule appt with me tomorrow for re-evaluation in clinic to check sp02 and lung sounds.  Patient preferred appt scheduled for 1100.  Spoke full sentences without difficulty, no cough, nasal sniffing/congestion or throat clearing noted during 4 minute telephone call.  Patient verbalized understanding information/instructions, agreed with plan of care and had no further questions at this time.

## 2020-11-24 ENCOUNTER — Encounter: Payer: Self-pay | Admitting: Registered Nurse

## 2020-11-24 ENCOUNTER — Ambulatory Visit: Payer: Self-pay | Admitting: Registered Nurse

## 2020-11-24 ENCOUNTER — Other Ambulatory Visit: Payer: Self-pay

## 2020-11-24 VITALS — BP 108/72 | HR 86 | Temp 97.9°F

## 2020-11-24 DIAGNOSIS — J019 Acute sinusitis, unspecified: Secondary | ICD-10-CM

## 2020-11-24 MED ORDER — PHENYLEPHRINE HCL 5 MG PO TABS: 5.0000 mg | ORAL_TABLET | Freq: Four times a day (QID) | ORAL | Status: AC | PRN

## 2020-11-24 NOTE — Progress Notes (Signed)
Subjective:    Patient ID: Kara [name], female    DOB: 05-25-1971, 50 y.o.   MRN: [id number]  49y/o divorced caucasian female established patient here for re-evaluation of cough s/p covid infection 30 May positive test/symptoms.  Cough typically worse when lying down am/hs.  Resolves when up/standing/working.  Some fatigue still.  When productive clear yellow to green tint.  Denied fever/chills/shortness of breath/chest pain.  Some sinus pressure/congestion also.     Review of Systems  Constitutional:  Positive for fatigue. Negative for activity change, appetite change, chills, diaphoresis, fever and unexpected weight change.  HENT:  Positive for congestion, postnasal drip, rhinorrhea, sinus pressure, sinus pain and sore throat. Negative for dental problem, drooling, ear discharge, ear pain, facial swelling, hearing loss, mouth sores, nosebleeds, sneezing, tinnitus, trouble swallowing and voice change.   Eyes:  Negative for photophobia, pain, discharge, redness, itching and visual disturbance.  Respiratory:  Positive for cough. Negative for choking, chest tightness, shortness of breath, wheezing and stridor.   Cardiovascular:  Negative for chest pain, palpitations and leg swelling.  Gastrointestinal:  Negative for diarrhea, nausea and vomiting.  Endocrine: Negative for cold intolerance and heat intolerance.  Genitourinary:  Negative for difficulty urinating, dysuria and hematuria.  Musculoskeletal:  Negative for arthralgias, back pain, gait problem, joint swelling, myalgias, neck pain and neck stiffness.  Skin:  Negative for color change, pallor, rash and wound.  Allergic/Immunologic: Positive for environmental allergies. Negative for food allergies.  Neurological:  Positive for headaches. Negative for dizziness, tremors, seizures, syncope, facial asymmetry, speech difficulty, weakness, light-headedness and numbness.  Hematological:  Negative for adenopathy. Does not bruise/bleed easily.   Psychiatric/Behavioral:  Negative for agitation, behavioral problems, confusion and sleep disturbance.       Objective:   Physical Exam Vitals and nursing note reviewed.  Constitutional:      General: She is awake. She is not in acute distress.    Appearance: She is well-developed, well-groomed and normal weight. She is ill-appearing. She is not toxic-appearing or diaphoretic.  HENT:     Head: Normocephalic and atraumatic.     Jaw: There is normal jaw occlusion. No trismus.     Salivary Glands: Right salivary gland is not diffusely enlarged or tender. Left salivary gland is not diffusely enlarged or tender.     Right Ear: Hearing, ear canal and external ear normal. A middle ear effusion is present.     Left Ear: Hearing, ear canal and external ear normal. A middle ear effusion is present.     Nose: Mucosal edema, congestion and rhinorrhea present. No nasal deformity, septal deviation, signs of injury or laceration. Rhinorrhea is clear.     Right Turbinates: Enlarged and swollen. Not pale.     Left Turbinates: Enlarged and swollen. Not pale.     Right Sinus: Maxillary sinus tenderness and frontal sinus tenderness present.     Left Sinus: Maxillary sinus tenderness and frontal sinus tenderness present.     Comments: Frontal bilateral greater than maxillary tenderness to palpation; cobblestoning posterior pharynx; bilateral allergic shiners; air fluid level clear bilateral TMs; some nasal sniffing noted in exam room    Mouth/Throat:     Lips: Pink. No lesions.     Mouth: Mucous membranes are moist. Mucous membranes are not pale, not dry and not cyanotic. No lacerations, oral lesions or angioedema.     Dentition: No dental abscesses or gum lesions.     Tongue: No lesions. Tongue does not deviate from midline.  Palate: No mass and lesions.     Pharynx: Uvula midline. Pharyngeal swelling and posterior oropharyngeal erythema present. No oropharyngeal exudate or uvula swelling.     Tonsils:  No tonsillar exudate or tonsillar abscesses. 0 on the right. 0 on the left.  Eyes:     General: Lids are normal. Vision grossly intact. Gaze aligned appropriately. Allergic shiner present. No visual field deficit or scleral icterus.       Right eye: No foreign body, discharge or hordeolum.        Left eye: No foreign body, discharge or hordeolum.     Extraocular Movements: Extraocular movements intact.     Right eye: Normal extraocular motion and no nystagmus.     Left eye: Normal extraocular motion and no nystagmus.     Conjunctiva/sclera: Conjunctivae normal.     Right eye: Right conjunctiva is not injected. No chemosis, exudate or hemorrhage.    Left eye: Left conjunctiva is not injected. No chemosis, exudate or hemorrhage.    Pupils: Pupils are equal, round, and reactive to light. Pupils are equal.     Right eye: Pupil is round and reactive.     Left eye: Pupil is round and reactive.  Neck:     Thyroid: No thyroid mass or thyromegaly.     Trachea: Trachea and phonation normal. No tracheal tenderness or tracheal deviation.  Cardiovascular:     Rate and Rhythm: Normal rate and regular rhythm.     Pulses: Normal pulses.          Radial pulses are 2+ on the right side and 2+ on the left side.     Heart sounds: Normal heart sounds, S1 normal and S2 normal. No murmur heard.   No friction rub. No gallop.  Pulmonary:     Effort: Pulmonary effort is normal. No accessory muscle usage or respiratory distress.     Breath sounds: Normal breath sounds and air entry. No stridor. No decreased breath sounds, wheezing, rhonchi or rales.     Comments: Spoke full sentences without difficulty; no cough noted in exam room Chest:     Chest wall: No tenderness.  Abdominal:     General: Abdomen is flat. There is no distension.     Palpations: Abdomen is soft.  Musculoskeletal:        General: No tenderness. Normal range of motion.     Right shoulder: Normal.     Left shoulder: Normal.     Right hand:  Normal.     Left hand: Normal.     Cervical back: Normal range of motion and neck supple. No swelling, edema, deformity, erythema, signs of trauma, lacerations, rigidity, tenderness or crepitus. No pain with movement, spinous process tenderness or muscular tenderness. Normal range of motion.     Thoracic back: No swelling, edema, deformity, signs of trauma or lacerations. Normal range of motion.     Lumbar back: No swelling, edema, deformity, signs of trauma or lacerations. Normal range of motion.     Right hip: Normal.     Left hip: Normal.     Right knee: Normal.     Left knee: Normal.     Right lower leg: No swelling. No edema.     Left lower leg: No swelling. No edema.  Lymphadenopathy:     Head:     Right side of head: No submental, submandibular, tonsillar, preauricular, posterior auricular or occipital adenopathy.     Left side of head: No submental, submandibular, tonsillar,  preauricular, posterior auricular or occipital adenopathy.     Cervical: No cervical adenopathy.     Right cervical: No superficial, deep or posterior cervical adenopathy.    Left cervical: No superficial, deep or posterior cervical adenopathy.  Skin:    General: Skin is warm and dry.     Capillary Refill: Capillary refill takes less than 2 seconds.     Coloration: Skin is not ashen, cyanotic, jaundiced, mottled, pale or sallow.     Findings: No abrasion, abscess, acne, bruising, burn, ecchymosis, erythema, signs of injury, laceration, lesion, petechiae, rash or wound.     Nails: There is no clubbing.  Neurological:     General: No focal deficit present.     Mental Status: She is alert and oriented to person, place, and time. Mental status is at baseline. She is not disoriented.     GCS: GCS eye subscore is 4. GCS verbal subscore is 5. GCS motor subscore is 6.     Cranial Nerves: Cranial nerves are intact. No cranial nerve deficit, dysarthria or facial asymmetry.     Sensory: Sensation is intact. No sensory  deficit.     Motor: Motor function is intact. No weakness, tremor, atrophy, abnormal muscle tone or seizure activity.     Coordination: Coordination is intact. Coordination normal.     Gait: Gait is intact. Gait normal.     Comments: Gait sure and steady in clinic; in/out of chair and on/off exam table without difficulty; bilateral hand grasp equal 5/5  Psychiatric:        Attention and Perception: Attention and perception normal.        Mood and Affect: Mood and affect normal.        Speech: Speech normal.        Behavior: Behavior normal. Behavior is cooperative.        Thought Content: Thought content normal.        Cognition and Memory: Cognition and memory normal.        Judgment: Judgment normal.      Bilateral allergic shiners; cobblestoning posterior pharynx; bilateral TMs air fluid level clear    Assessment & Plan:  A-acute rhinosinusitis  P-Forgot dayquil at home discussed clinic has UD phenylephrine available.  May take phenylephrine 5-10mg  po QID prn given 4 UD from clinic stock  Currenly only taking one dose dayquil am and nyquil pm.  DIscussed typically these OTC are multidrug in one pill.  Follow up if cough worsening.  Refused Rx for prescription cough medicine or prednisone low dose taper.  Restart flonase 1 spray each nostril BID, saline 2 sprays each nostril q2h wa prn congestion.  Denied personal or family history of ENT cancer.  Shower BID am and especially prior to bed/after work. No evidence of systemic bacterial infection, non toxic and well hydrated.  I do not see where any further testing or imaging is necessary at this time.   I will suggest supportive care, rest, good hygiene and encourage the patient to take adequate fluids.  The patient is to return to clinic or EMERGENCY ROOM if symptoms worsen or change significantly.  Exitcare handouts on nonallergic rhinitis, cough and sinus rinse.  Patient verbalized agreement and understanding of treatment plan and had no  further questions at this time.   P2:  Hand washing and cover cough

## 2020-11-24 NOTE — Patient Instructions (Signed)
Cough, Adult Coughing is a reflex that clears your throat and your airways (respiratory system). Coughing helps to heal and protect your lungs. It is normal to cough occasionally, but a cough that happens with other symptoms or lasts a long time may be a sign of a condition that needs treatment. An acute cough may only last2-3 weeks, while a chronic cough may last 8 or more weeks. Coughing is commonly caused by: Infection of the respiratory systemby viruses or bacteria. Breathing in substances that irritate your lungs. Allergies. Asthma. Mucus that runs down the back of your throat (postnasal drip). Smoking. Acid backing up from the stomach into the esophagus (gastroesophageal reflux). Certain medicines. Chronic lung problems. Other medical conditions such as heart failure or a blood clot in the lung (pulmonary embolism). Follow these instructions at home: Medicines Take over-the-counter and prescription medicines only as told by your health care provider. Talk with your health care provider before you take a cough suppressant medicine. Lifestyle  Avoid cigarette smoke. Do not use any products that contain nicotine or tobacco, such as cigarettes, e-cigarettes, and chewing tobacco. If you need help quitting, ask your health care provider. Drink enough fluid to keep your urine pale yellow. Avoid caffeine. Do not drink alcohol if your health care provider tells you not to drink.  General instructions  Pay close attention to changes in your cough. Tell your health care provider about them. Always cover your mouth when you cough. Avoid things that make you cough, such as perfume, candles, cleaning products, or campfire or tobacco smoke. If the air is dry, use a cool mist vaporizer or humidifier in your bedroom or your home to help loosen secretions. If your cough is worse at night, try to sleep in a semi-upright position. Rest as needed. Keep all follow-up visits as told by your health  care provider. This is important.  Contact a health care provider if you: Have new symptoms. Cough up pus. Have a cough that does not get better after 2-3 weeks or gets worse. Cannot control your cough with cough suppressant medicines and you are losing sleep. Have pain that gets worse or pain that is not helped with medicine. Have a fever. Have unexplained weight loss. Have night sweats. Get help right away if: You cough up blood. You have difficulty breathing. Your heartbeat is very fast. These symptoms may represent a serious problem that is an emergency. Do not wait to see if the symptoms will go away. Get medical help right away. Call your local emergency services (911 in the [place] Do not drive yourself to the hospital. Summary Coughing is a reflex that clears your throat and your airways. It is normal to cough occasionally, but a cough that happens with other symptoms or lasts a long time may be a sign of a condition that needs treatment. Take over-the-counter and prescription medicines only as told by your health care provider. Always cover your mouth when you cough. Contact a health care provider if you have new symptoms or a cough that does not get better after 2-3 weeks or gets worse. This information is not intended to replace advice given to you by your health care provider. Make sure you discuss any questions you have with your healthcare provider. Document Revised: 06/16/2018 Document Reviewed: 06/16/2018 Elsevier Patient Education  2022 [employer]. How to Perform a Sinus Rinse A sinus rinse is a home treatment that is used to rinse your sinuses with a sterile mixture of salt and water (  saline solution). Sinuses are air-filled spaces in your skull behind the bones of your faceand forehead that open into your nasal cavity. A sinus rinse can help to clear mucus, dirt, dust, or pollen from your nasal cavity. You may do a sinus rinse when you have a cold, a virus, nasal  allergysymptoms, a sinus infection, or stuffiness in your nose or sinuses. Talk with your health care provider about whether a sinus rinse might help you. What are the risks? A sinus rinse is generally safe and effective. However, there are a few risks, which include: A burning sensation in your sinuses. This may happen if you do not make the saline solution as directed. Be sure to follow all directions when making the saline solution. Nasal irritation. Infection from contaminated water. This is rare, but possible. Do not do a sinus rinse if you have had ear or nasal surgery, ear infection, orblocked ears. Supplies needed: Saline solution or powder. Distilled or sterile water to mix with saline powder. You may use boiled and cooled tap water. Boil tap water for 5 minutes; cool until it is lukewarm. Use within 24 hours. Do not use regular tap water to mix with the saline solution. Neti pot or nasal rinse bottle. These supplies release the saline solution into your nose and through your sinuses. Neti pots and nasal rinse bottles can be purchased at [facility] local pharmacy, a health food store, or online. How to perform a sinus rinse  Wash your hands with soap and water. Wash your device according to the directions that came with the product and then dry it. Use the solution that comes with your product or one that is sold separately in stores. Follow the mixing directions on the package to mix with sterile or distilled water. Fill the device with the amount of saline solution noted in the device instructions. Stand over a sink and tilt your head sideways over the sink. Place the spout of the device in your upper nostril (the one closer to the ceiling). Gently pour or squeeze the saline solution into your nasal cavity. The liquid should drain out from the lower nostril if you are not too congested. While rinsing, breathe through your open mouth. Gently blow your nose to clear any mucus and rinse  solution. Blowing too hard may cause ear pain. Repeat in your other nostril. Clean and rinse your device with clean water and then air-dry it. Talk with your health care provider or pharmacist if you have questions abouthow to do a sinus rinse. Summary A sinus rinse is a home treatment that is used to rinse your sinuses with a sterile mixture of salt and water (saline solution). A sinus rinse is generally safe and effective. Follow all instructions carefully. Before doing a sinus rinse, talk with your health care provider about whether it would be helpful for you. This information is not intended to replace advice given to you by your health care provider. Make sure you discuss any questions you have with your healthcare provider. Document Revised: 03/08/2020 Document Reviewed: 03/08/2020 Elsevier Patient Education  2022 [employer] Inc. Nonallergic Rhinitis Nonallergic rhinitis is inflammation of the mucous membrane inside the nose. The mucous membrane is the tissue that produces mucus. This condition is different from having allergic rhinitis, which is an allergy that affects the nose. Allergic rhinitis occurs when the body's defense system, or immune system, reacts to a substance that a person is allergic to (allergen), such as pollen, pet dander, mold, or dust. Nonallergic  rhinitis has manysimilar symptoms, but it is not caused by allergens. Nonallergic rhinitis can be an acute or chronic problem. This means it can beshort-term or long-term. What are the causes? This condition may be caused by many different things. Some common types of nonallergic rhinitis include: Infectious rhinitis. This is usually caused by an infection in the nose, throat, or upper airways (upper respiratory system). Vasomotor rhinitis. This is the most common type of chronic nonallergic rhinitis. It is caused by too much blood flow through your nose, and it leads to swelling in your nose. It is triggered by strong odors,  cold air, stress, drinking alcohol, cigarette smoke, or changes in the weather. Occupational rhinitis. This type is caused by triggers in the workplace, such as chemicals, dust, animal dander, or air pollution. Hormonal rhinitis, in teenage girls and women. This type is caused by an increase in the hormone estrogen and may happen during pregnancy, puberty, or monthly menstrual periods. Hormonal rhinitis gives you fewer symptoms when estrogen levels drop. Drug-induced rhinitis. Several types of medicines can cause this, such as medicines for high blood pressure or heart disease, aspirin, or NSAIDs. Nonallergic rhinitis with eosinophilia syndrome (NARES). This type is caused by having too much eosinophil, a type of white blood cell. Other causes include a reaction to eating hot or spicy foods. This does notusually cause long-term symptoms. In some cases, the cause of nonallergic rhinitis is not known. What increases the risk? You are more likely to develop this condition if: You are 72-26 years of age. You are a woman. Women are twice as likely to have this condition. What are the signs or symptoms? Common symptoms of this condition include: Stuffy nose (nasal congestion). Runny nose. A feeling of mucus dripping down the back of your throat (postnasal drip). Trouble sleeping. Tiredness, or fatigue. Other symptoms include: Sneezing. Coughing. Itchy nose. Bloodshot eyes. How is this diagnosed? This type may be diagnosed based on: Your symptoms and medical history. A physical exam. Allergy testing to rule out allergic rhinitis. You may have skin tests or blood tests. Your health care provider may also take a swab of nasal discharge to look for an increased number of eosinophils. This would be done to confirm a diagnosisof NARES. How is this treated? Treatment for this condition depends on the cause. No single treatment works for everyone. Work with your health care provider to find the best  treatment for you. Treatment may include: Avoiding the things that trigger your symptoms. Medicines to relieve congestion, such as: Steroid nasal spray. There are many types. You may need to try a few to find out which one works best. Engineer, civil (consulting) medicine. This treats nasal congestion and may be given by mouth or as a nasal spray. These medicines are used only for a short time. Medicines to relieve a runny nose. These may include antihistamine medicines or anticholinergic nasal sprays. Nasal irrigation. This involves using a salt-water (saline) spray or saline container called a neti pot. Nasal irrigation helps to clear away mucus and keep your nasal passages moist. Surgery to remove part of your mucous membrane. This is done in severe cases if the condition has not improved after 6-12 months of treatment. Follow these instructions at home: Medicines Take or use over-the-counter and prescription medicines only as told by your health care provider. Do not stop using your medicine even if you start to feel better. Do not take NSAIDs, such as ibuprofen, or medicines that contain aspirin if they make your symptoms  worse. Lifestyle Do not drink alcohol if it makes your symptoms worse. Do not use any products that contain nicotine or tobacco, such as cigarettes, e-cigarettes, and chewing tobacco. If you need help quitting, ask your health care provider. Avoid secondhand smoke. General instructions Avoid triggers that make your symptoms worse. Use nasal irrigation as told by your health care provider. Get exercise. Exercise may help reduce symptoms for some people. Sleep with the head of your bed raised. This may reduce nasal congestion when you sleep. Drink enough fluid to keep your urine pale yellow. Keep all follow-up visits as told by your health care provider. This is important. Contact a health care provider if: You have a fever. Your symptoms are getting worse at home. Your symptoms do not  lessen with medicine. You develop new symptoms, especially a headache or nosebleed. Summary Nonallergic rhinitis is inflammation inside the nose that is not caused by allergens. Nonallergic rhinitis can be a short-term or long-term problem. Treatment may include avoiding the things that trigger your symptoms. Take or use over-the-counter and prescription medicines only as told by your health care provider. Do not stop using your medicine even if you start to feel better. Contact a health care provider if your symptoms do not lessen with medicine. This information is not intended to replace advice given to you by your health care provider. Make sure you discuss any questions you have with your healthcare provider. Document Revised: 04/06/2019 Document Reviewed: 04/06/2019 Elsevier Patient Education  2022 [employer].

## 2020-12-01 NOTE — Telephone Encounter (Signed)
Telephone message left for patient checking in to see if cough finally resolved.

## 2020-12-22 ENCOUNTER — Telehealth: Payer: Self-pay | Admitting: Registered Nurse

## 2020-12-22 ENCOUNTER — Encounter: Payer: Self-pay | Admitting: Registered Nurse

## 2020-12-22 NOTE — Telephone Encounter (Signed)
[employer] notified NP that pt reported symptoms today and called in sick stayed home to quarantine.  Home test negative today for covid.  Patient contacted via telephone and stated sore throat, headache, fatigue.  Was positive for covid 11/08/20 and this feels similar.  She took antivirals during last covid illness.  Restarted dayquil/nyquil  Denied known sick contacts.   Pt began quarantine today 12/22/20. Patient did not develop symptoms of  trouble breathing, chest pain, nausea, vomiting, diarrhea, fever or chills.   5 day quarantine per [facility] recommendations. Day 1 of quarantine was 12/22/20  Day 5 7/19  Plan for retesting on Day 3 or 4 unless symptoms drastically worsen over the next 24 hours.  Patient to contact me if vomiting after coughing or unable to tolerate po fluids.  Discussed variant 2.12 was dominant during her last illness and variants 4&5 now dominant in [place].  These variants are reinfecting persons who had covid in the previous 90 days so quarantine is recommended at this time.  Patient to isolate in own room and if possible use only one bathroom if living with others in home.  Wear mask when out of room to help prevent spread to others in household.  Sanitize high touch surfaces with lysol/chlorox/bleach spray or wipes daily as viruses are known to live on surfaces from 24 hours to days.  Patient has dayquil/nyquil at home for cough/cold use and is working for her. May use flonase nasal 1 spray each nostril BID prn rhinitis.  Dayquil and nyquil per manufacturer instructions.   Discussed honey 1 tablespoon every 4 hours is a natural cough suppressant.  Avoid dehydration and drink water to keep urine pale yellow clear and voiding every 2-4 hours while awake.  Patient alert and oriented x3, spoke full sentences without difficulty.  Some nasal congestion noted.  Cough and throat clearing not audible during 7 minute telephone call.  Discussed with patient she can contact me at [phone]  as RN Rolly Salter on vacation until Monday 18 Jul if questions or concerns HR notified patient quarantining and retesting at this time. symptoms 7/14  negative home test 7/14 Day 0  Day 5 7/19  RTW estimated 7/20  Day 10 7/24  Pt verbalized understanding and agreement with plan of care. No further questions/concerns at this time. Strict mask wear through Day 10 and no eating in employee lunch room.

## 2020-12-24 NOTE — Telephone Encounter (Signed)
See new tcon 12/22/20.  Patient reported she never felt like she cleared all symptoms from this covid infection and worsening of symptoms/feels like covid infection starting all over again.

## 2020-12-24 NOTE — Telephone Encounter (Signed)
Patient contacted via telephone and stated still using dayquil and nyquil both with acetaminophen.  No cough.  Headache resolved.  Patient feels like she has stuff stuck in her throat but not coughing or expectorating anything.  Hasn't covid tested yet this weekend but once she does test today or tomorrow will email me results.  Patient feeling better than earlier this week  Discussed with patient if post nasal drip can irritate throat/swelling so may feel like post nasal drip collecting in back of throat.  Patient asked if test negative if she can return to work on Monday 18 Jul.  Discussed with patient because she had covid in the previous 90 days, if test negative and symptoms improved may return 18 Jul with strict mask wear through Day 10.  Discussed prior to May she would still be on 90 day exemption due to recent covid infection but due to new variants reinfecting persons prior to end of 90 day exemption we had to be prudent/quarantine and test.  Patient A&Ox3 spoke full sentences without difficulty; no cough/throat clearing or nasal congestion noted during 5 minute telephone call.  Patient reported fatigue resolved also.  Patient verbalized understanding information/instructions, agreed with plan of care and had no further questions at this time.  HR notified patient improving test results pending.

## 2020-12-25 NOTE — Telephone Encounter (Signed)
Patient notified NP covid test negative feeling well ready to return tomorrow to work with mask.  Discussed with patient to wear mask through 7/24 and no eating in employee lunch room.  Patient A&Ox3 spoke full sentences without difficulty denied n/v/d/f/c in previous 24 hours.  No cough/nasal congestion or throat clearing noted during 2 minute telephone call.  HR notified patient cleared to return onsite with mask wear through day 10 negative covid test.  Patient verbalized understanding information/instructions, agreed with plan of care and had no further questions at this time.

## 2020-12-26 NOTE — Telephone Encounter (Signed)
Pt returned RN call and reported that she is at work as expected. Since positive Covid in past 90 days and negative tests now with sx improving, cleared to RTW today 7/18 by NP [name]. Strict mask use in place thru 7/24. Denies questions or concerns. Closing encounter.

## 2020-12-26 NOTE — Telephone Encounter (Signed)
Reviewed RN Rolly Salter note patient returned to work as expected.  Strict mask wear through 01/01/21 agreed with plan of care.

## 2021-01-12 NOTE — Telephone Encounter (Signed)
Patient contacted NP stating symptoms resolved feeling well and thank you for following up.

## 2021-01-16 ENCOUNTER — Encounter: Payer: Self-pay | Admitting: Registered Nurse

## 2021-01-16 ENCOUNTER — Telehealth: Payer: Self-pay | Admitting: Registered Nurse

## 2021-01-16 DIAGNOSIS — Z20822 Contact with and (suspected) exposure to covid-19: Secondary | ICD-10-CM

## 2021-01-16 NOTE — Telephone Encounter (Signed)
[employer] Tim notified NP that pt was identified during contact tracing as close contact of employee who recently reported positive covid test 8/7 and symptoms started evening 8/4.  Date of contact 8/4.  Patient up to date on her covid vaccinations no quarantine indicated at this time.  Asymptomatic and to monitor for symptoms x 10 days.  Wear mask when around others for the next 10 days No eating in employee lunch room through 01/22/21.  Plan for home testing day 5 after exposure 8/9  Day 10 8/14 last day of strict mask use.     Possible Covid symptoms including cough, shortness of breath with exertion or at rest, runny nose, congestion, sinus pain/pressure, sore throat, fever/chills, body aches, fatigue, loss of taste/smell, GI symptoms of nausea/vomiting/diarrhea.      Notify clinic staff if symptoms or positive test results.

## 2021-01-19 NOTE — Telephone Encounter (Signed)
Noted continues asymptomatic after covid exposure continue monitoring for symptoms through day 10

## 2021-01-19 NOTE — Telephone Encounter (Signed)
Called pt to confirm still asymptomatic. Day 7 today. No answer. LVM. Will f/u again 8/15 after Day 10 over the weekend.

## 2021-01-19 NOTE — Telephone Encounter (Signed)
Pt returned call and reports feeling well. Remains asymptomatic.

## 2021-01-23 NOTE — Telephone Encounter (Signed)
Reviewed RN Rolly Salter note asymptomatic and Day 10 01/22/21 closing encounter.

## 2021-01-23 NOTE — Telephone Encounter (Signed)
Pt remains asymptomatic after Day 10 8/14 of exposure. Closing encounter.

## 2021-01-26 ENCOUNTER — Telehealth: Payer: Self-pay | Admitting: Registered Nurse

## 2021-01-26 ENCOUNTER — Encounter: Payer: Self-pay | Admitting: Registered Nurse

## 2021-01-26 DIAGNOSIS — Z20822 Contact with and (suspected) exposure to covid-19: Secondary | ICD-10-CM

## 2021-01-26 NOTE — Telephone Encounter (Signed)
Notified by RN Rolly Salter patient reported close covid exposure 01/19/21.  Replacements safety team notified patient she is to have strict mask use thru 01/29/21 Day 10 and testing 01/24/21. Kara [name] had told Kara [name] that the whole team in [employer] will need to mask on Friday regardless of if they drop the mask mandate if CDC changes county to status lower than high transmission.    Patient has previously had close covid exposure recently and aware of CDC recommendations.  Cleared to remain onsite with strict mask use through Day 10 and monitoring for symptoms.  Patient to stay home if symptoms develop or leave work and contact clinic staff for further instructions.

## 2021-02-04 NOTE — Telephone Encounter (Signed)
Patient contacted via telephone reported never developed symptoms feeling well no concerns or questions Day 10 was 01/29/21 for symptom monitoring post close covid exposure.  Encounter closed.  Patient A&Ox3 spoke full sentences without difficulty no cough/congestion or throat clearing audible during 1 minute telephone call.

## 2021-04-20 ENCOUNTER — Other Ambulatory Visit: Payer: Self-pay

## 2021-04-20 ENCOUNTER — Ambulatory Visit: Payer: Self-pay | Admitting: Registered Nurse

## 2021-04-20 DIAGNOSIS — R1310 Dysphagia, unspecified: Secondary | ICD-10-CM

## 2021-04-20 MED ORDER — OMEPRAZOLE 20 MG PO CPDR: 20.0000 mg | DELAYED_RELEASE_CAPSULE | Freq: Every day | ORAL | 0 refills | Status: DC

## 2021-04-20 NOTE — Progress Notes (Signed)
Subjective:    Patient ID: Kara [name], female    DOB: 02-Feb-1971, 50 y.o.   MRN: [id number]  49y/o Caucasian established female pt c/o sensation of something stuck in throat for past several months. Endorses trouble swallowing at times when eating.  Sometimes feeling like she has heartburn also.  Has not tried any OTC medication.  Denied vomiting/regurgitation into mouth, choking, dyspnea, changes in voice.       Review of Systems  Constitutional:  Negative for activity change, appetite change, chills, diaphoresis, fatigue and fever.  HENT:  Positive for trouble swallowing. Negative for voice change.   Eyes:  Negative for photophobia and visual disturbance.  Respiratory:  Negative for cough, shortness of breath, wheezing and stridor.   Cardiovascular:  Negative for chest pain.  Gastrointestinal:  Negative for constipation, diarrhea and vomiting.  Endocrine: Negative for cold intolerance and heat intolerance.  Genitourinary:  Negative for difficulty urinating.  Musculoskeletal:  Negative for gait problem, neck pain and neck stiffness.  Skin:  Negative for rash.  Allergic/Immunologic: Positive for environmental allergies. Negative for food allergies.  Neurological:  Negative for dizziness, tremors, seizures, syncope, facial asymmetry, speech difficulty, weakness, light-headedness, numbness and headaches.  Hematological:  Negative for adenopathy. Does not bruise/bleed easily.  Psychiatric/Behavioral:  Negative for agitation, confusion and sleep disturbance.       Objective:   Physical Exam Nursing note reviewed.  Constitutional:      General: She is awake. She is not in acute distress.    Appearance: Normal appearance. She is well-developed, well-groomed and normal weight. She is not ill-appearing, toxic-appearing or diaphoretic.  HENT:     Head: Normocephalic and atraumatic.     Jaw: There is normal jaw occlusion.     Salivary Glands: Right salivary gland is not diffusely  enlarged. Left salivary gland is not diffusely enlarged.     Right Ear: Hearing and external ear normal.     Left Ear: Hearing and external ear normal.     Nose: Nose normal. No congestion or rhinorrhea.     Mouth/Throat:     Lips: Pink. No lesions.     Mouth: Mucous membranes are moist. No lacerations, oral lesions or angioedema.     Dentition: No dental abscesses or gum lesions.     Tongue: No lesions. Tongue does not deviate from midline.     Palate: No mass and lesions.     Pharynx: Oropharynx is clear. No oropharyngeal exudate or posterior oropharyngeal erythema.  Eyes:     General: Lids are normal. Vision grossly intact. Gaze aligned appropriately. Allergic shiner present. No scleral icterus.       Right eye: No discharge.        Left eye: No discharge.     Extraocular Movements: Extraocular movements intact.     Conjunctiva/sclera: Conjunctivae normal.     Pupils: Pupils are equal, round, and reactive to light.  Neck:     Thyroid: No thyromegaly.     Trachea: Trachea and phonation normal. No tracheal tenderness or tracheal deviation.  Pulmonary:     Effort: Pulmonary effort is normal. No respiratory distress.     Breath sounds: Normal breath sounds and air entry. No stridor or transmitted upper airway sounds. No wheezing.     Comments: Spoke full sentences without difficulty; no cough observed in exam room Abdominal:     General: Abdomen is flat.  Musculoskeletal:        General: No swelling, tenderness, deformity or signs of  injury. Normal range of motion.     Cervical back: Normal range of motion and neck supple. No swelling, edema, deformity, erythema, signs of trauma, lacerations, rigidity or crepitus. No pain with movement. Normal range of motion.     Thoracic back: No swelling, edema, deformity, signs of trauma or lacerations. Normal range of motion.  Lymphadenopathy:     Head:     Right side of head: No submandibular or preauricular adenopathy.     Left side of head:  No submandibular or preauricular adenopathy.     Cervical: No cervical adenopathy.     Right cervical: No superficial cervical adenopathy.    Left cervical: No superficial cervical adenopathy.  Skin:    General: Skin is warm and dry.     Capillary Refill: Capillary refill takes less than 2 seconds.     Coloration: Skin is not ashen, cyanotic, jaundiced, mottled, pale or sallow.     Findings: No abrasion, abscess, acne, bruising, burn, ecchymosis, erythema, signs of injury, laceration, lesion, petechiae, rash or wound.     Nails: There is no clubbing.  Neurological:     General: No focal deficit present.     Mental Status: She is alert and oriented to person, place, and time. Mental status is at baseline.     GCS: GCS eye subscore is 4. GCS verbal subscore is 5. GCS motor subscore is 6.     Cranial Nerves: Cranial nerves 2-12 are intact. No cranial nerve deficit, dysarthria or facial asymmetry.     Sensory: Sensation is intact.     Motor: Motor function is intact. No weakness, tremor, atrophy, abnormal muscle tone or seizure activity.     Coordination: Coordination is intact. Coordination normal.     Gait: Gait is intact. Gait normal.     Comments: In/out of chair without difficulty; gait sure and steady in clinic; bilateral hand grasp equal 5/5  Psychiatric:        Attention and Perception: Attention and perception normal.        Mood and Affect: Mood and affect normal.        Speech: Speech normal.        Behavior: Behavior normal. Behavior is cooperative.        Thought Content: Thought content normal.        Cognition and Memory: Cognition and memory normal.        Judgment: Judgment normal.   Results for ULISSA, CLUNIE (MRN [id number]) as of 04/20/2021 21:55  Ref. Range 07/21/2020 08:33  Sodium Latest Ref Range: 135 - 145 mEq/L 140  Potassium Latest Ref Range: 3.5 - 5.1 mEq/L 4.5  Chloride Latest Ref Range: 96 - 112 mEq/L 105  CO2 Latest Ref Range: 19 - 32 mEq/L 28  Glucose  Latest Ref Range: 70 - 99 mg/dL 90  BUN Latest Ref Range: 6 - 23 mg/dL 15  Creatinine Latest Ref Range: 0.40 - 1.20 mg/dL 0.80  Calcium Latest Ref Range: 8.4 - 10.5 mg/dL 8.8  Alkaline Phosphatase Latest Ref Range: 39 - 117 U/L 40  Albumin Latest Ref Range: 3.5 - 5.2 g/dL 4.3  AST Latest Ref Range: 0 - 37 U/L 15  ALT Latest Ref Range: 0 - 35 U/L 9  Total Protein Latest Ref Range: 6.0 - 8.3 g/dL 7.0  Total Bilirubin Latest Ref Range: 0.2 - 1.2 mg/dL 0.3  GFR Latest Ref Range: >60.00 mL/min 86.66  Total CHOL/HDL Ratio Unknown 4  Cholesterol Latest Ref Range: 0 - 200  mg/dL 242 (H)  HDL Cholesterol Latest Ref Range: >39.00 mg/dL 67.70  LDL (calc) Latest Ref Range: 0 - 99 mg/dL 162 (H)  NonHDL Unknown 173.80  Triglycerides Latest Ref Range: 0.0 - 149.0 mg/dL 57.0  VLDL Latest Ref Range: 0.0 - 40.0 mg/dL 11.4  Iron Latest Ref Range: 42 - 145 ug/dL 43  Ferritin Latest Ref Range: 10.0 - 291.0 ng/mL 6.0 (L)  WBC Latest Ref Range: 4.0 - 10.5 K/uL 5.0  RBC Latest Ref Range: 3.87 - 5.11 Mil/uL 4.14  Hemoglobin Latest Ref Range: 12.0 - 15.0 g/dL 11.0 (L)  HCT Latest Ref Range: 36.0 - 46.0 % 34.1 (L)  MCV Latest Ref Range: 78.0 - 100.0 fl 82.3  MCHC Latest Ref Range: 30.0 - 36.0 g/dL 32.3  RDW Latest Ref Range: 11.5 - 15.5 % 16.4 (H)  Platelets Latest Ref Range: 150.0 - 400.0 K/uL 342.0  Neutrophils Latest Ref Range: 43.0 - 77.0 % 67.0  Lymphocytes Latest Ref Range: 12.0 - 46.0 % 25.3  Monocytes Relative Latest Ref Range: 3.0 - 12.0 % 4.5  Eosinophil Latest Ref Range: 0.0 - 5.0 % 2.3  Basophil Latest Ref Range: 0.0 - 3.0 % 0.9  NEUT# Latest Ref Range: 1.4 - 7.7 K/uL 3.3  Lymphocyte # Latest Ref Range: 0.7 - 4.0 K/uL 1.3  Monocyte # Latest Ref Range: 0.1 - 1.0 K/uL 0.2  Eosinophils Absolute Latest Ref Range: 0.0 - 0.7 K/uL 0.1  Basophils Absolute Latest Ref Range: 0.0 - 0.1 K/uL 0.0  Hemoglobin A1C Latest Ref Range: 4.6 - 6.5 % 5.7  TSH Latest Ref Range: 0.35 - 4.50 uIU/mL 1.21     RN  Hildred Alamin gave list of [facility] accepting GI providers to pt to review and she will notify clinic who she wants referral sent to next week.    Assessment & Plan:  A-dysphagia  P- patient with GERD symptoms and dysphagia. Suspect hiatal hernia.   Exitcare handouts on hiatal hernia and dysphagia.  Start omeprazole 20mg  DR po daily x 2 weeks #90 RF0 dispensed from [facility] to patient today.  GI referral.  Patient wants to review network provider list for her insurance and will notify clinic staff of her preference next week.  Keep symptom log.  DDx enlarged thyroid, neurologic condition, muscle weakness, infection, tumor.  Thyroid labs normal along with physical exam today of thyroid.  If new or worsening symptoms patient to notify clinic staff especially vomiting/unable to tolerate po intake/trouble breathing.  Patient verbalized understanding information/instructions, agreed with plan of care and had no further questions at this time.

## 2021-04-20 NOTE — Patient Instructions (Signed)
Start taking Omeprazole daily for 2 weeks and return to clinic for re-evaluation. Appointment made for Tuesday 05/02/21 at 11:00am.

## 2021-06-26 ENCOUNTER — Encounter: Payer: Self-pay | Admitting: Registered Nurse

## 2021-06-26 ENCOUNTER — Telehealth: Payer: Self-pay | Admitting: Registered Nurse

## 2021-06-26 DIAGNOSIS — R12 Heartburn: Secondary | ICD-10-CM

## 2021-06-26 DIAGNOSIS — R1310 Dysphagia, unspecified: Secondary | ICD-10-CM

## 2021-06-26 MED ORDER — OMEPRAZOLE 20 MG PO CPDR: 20.0000 mg | DELAYED_RELEASE_CAPSULE | Freq: Every day | ORAL | 0 refills | Status: DC

## 2021-06-26 NOTE — Telephone Encounter (Signed)
Patient would like to continue omeprazole helping.  Refilled from [facility] omeprazole 84m po daily #90 RF0. Patient still has not met with GI referral active.

## 2021-06-28 ENCOUNTER — Other Ambulatory Visit: Payer: Self-pay | Admitting: Family Medicine

## 2021-06-28 DIAGNOSIS — Z1231 Encounter for screening mammogram for malignant neoplasm of breast: Secondary | ICD-10-CM

## 2021-07-04 ENCOUNTER — Other Ambulatory Visit: Payer: Self-pay

## 2021-07-04 ENCOUNTER — Ambulatory Visit
Admission: RE | Admit: 2021-07-04 | Discharge: 2021-07-04 | Disposition: A | Discharge status: Home / Self Care | Payer: No Typology Code available for payment source | Source: Ambulatory Visit | Attending: Family Medicine | Admitting: Family Medicine

## 2021-07-04 DIAGNOSIS — Z1231 Encounter for screening mammogram for malignant neoplasm of breast: Secondary | ICD-10-CM

## 2021-07-15 NOTE — Telephone Encounter (Signed)
Patient seen in workcenter feeling well denied concerns or questions. °

## 2021-08-29 ENCOUNTER — Telehealth: Payer: Self-pay | Admitting: Registered Nurse

## 2021-08-29 ENCOUNTER — Encounter: Payer: Self-pay | Admitting: Registered Nurse

## 2021-08-29 DIAGNOSIS — R519 Headache, unspecified: Secondary | ICD-10-CM

## 2021-08-29 NOTE — Telephone Encounter (Signed)
Patient reported has had recurrent headache unsure if related to new exercise yoga class/tweaking her neck.  Discussed with patient I recommended she schedule appt with me available until noon today for same day appts and will check blood pressure also.  Patient verbalized understanding information/instructions agreed with plan of care and to contact RN Rolly Salter to schedule appt with NP/have BP check.  Discussed with patient could be related to exercise if muscle spasms/tight neck muscles can pull on scalp and cause tension headache.  Patient A&Ox3 spoke full sentences without difficulty gait sure and steady in workcenter respirations even and unlabored RA.  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 ?

## 2021-08-30 ENCOUNTER — Other Ambulatory Visit: Payer: Self-pay | Admitting: Family Medicine

## 2021-08-30 NOTE — Telephone Encounter (Signed)
Pt is overdue for CPE with labs prior, please call and schedule CPE (labs prior if possible) and route back to me to refill med once appt has been made  ?

## 2021-09-05 ENCOUNTER — Encounter: Payer: Self-pay | Admitting: Registered Nurse

## 2021-09-05 ENCOUNTER — Ambulatory Visit: Payer: Self-pay | Admitting: Registered Nurse

## 2021-09-05 VITALS — BP 128/82 | HR 79 | Resp 16

## 2021-09-05 DIAGNOSIS — R12 Heartburn: Secondary | ICD-10-CM

## 2021-09-05 DIAGNOSIS — R519 Headache, unspecified: Secondary | ICD-10-CM

## 2021-09-05 DIAGNOSIS — R1310 Dysphagia, unspecified: Secondary | ICD-10-CM

## 2021-09-05 MED ORDER — OMEPRAZOLE 20 MG PO CPDR: 20.0000 mg | DELAYED_RELEASE_CAPSULE | Freq: Two times a day (BID) | ORAL | 0 refills | Status: DC | PRN

## 2021-09-05 NOTE — Patient Instructions (Signed)
Sinus Headache ?A sinus headache occurs when your sinuses become clogged or swollen. Sinuses are air-filled spaces in your skull that are behind the bones of your face and forehead. Sinus headaches can range from mild to severe. ?What are the causes? ?A sinus headache can result from various conditions that affect the sinuses. Common causes include: ?Colds. ?Sinus infections. ?Allergies. ?Many people confuse sinus headaches with migraines or tension headaches because both headaches can cause facial pain and nasal symptoms. ?What are the signs or symptoms? ?The main symptom of this condition is a headache that may feel like pain or pressure in your face, forehead, ears, or upper teeth. People who have a sinus headache often have other symptoms, such as: ?Congested or runny nose. ?Fever. ?Inability to smell. ?Weather changes can make symptoms worse. ?How is this diagnosed? ?This condition may be diagnosed based on: ?A physical exam and medical history. ?Imaging tests, such as a CT scan or MRI, to check for problems with the sinuses. ?Examination of the sinuses using a thin tool with a camera that is inserted through your nose (endoscopy). ?How is this treated? ?Treatment for this condition depends on the cause. ?Sinus pain that is caused by a sinus infection may be treated with antibiotic medicine. ?Sinus pain that is caused by congestion may be helped by rinsing out (flushing) the nose and sinuses with saline solution. ?Sinus pain that is caused by allergies may be helped by allergy medicines (antihistamines) and medicated nasal sprays. ?Sinus surgery may be needed in some cases if other treatments do not help. ?Follow these instructions at home: ?General instructions ?If directed: ?Apply a warm, moist washcloth to your face to help relieve pain. ?Use a nasal saline wash. ?Hydrate and humidify ?Drink enough water to keep your urine clear or pale yellow. Staying hydrated will help to thin your mucus. ?Use a cool mist  humidifier to keep the humidity level in your home above 50%. ?Inhale steam for 10-15 minutes, 3-4 times a day or as told by your health care provider. You can do this in the bathroom while a hot shower is running. ?Limit your exposure to cool or dry air. ?Medicines ? ?Take over-the-counter and prescription medicines only as told by your health care provider. ?If you were prescribed an antibiotic medicine, take it as told by your health care provider. Do not stop taking the antibiotic even if you start to feel better. ?If you have congestion, use a nasal spray to help lessen pressure. ?Contact a health care provider if: ?You have a headache more than one time a week. ?You have sensitivity to light or sound. ?You develop a fever. ?You feel nauseous or you vomit. ?Your headaches do not get better with treatment. Many people think that they have a sinus headache when they actually have a migraine or a tension headache. ?Get help right away if: ?You have vision problems. ?You have sudden, severe pain in your face or head. ?You have a seizure. ?You are confused. ?You have a stiff neck. ?Summary ?A sinus headache occurs when your sinuses become clogged or swollen. ?A sinus headache can result from various conditions that affect the sinuses, such as a cold, a sinus infection, or an allergy. ?Treatment for this condition depends on the cause. It may include medicine, such as antibiotics or antihistamines. ?This information is not intended to replace advice given to you by your health care provider. Make sure you discuss any questions you have with your health care provider. ?Document Revised: 03/04/2020  Document Reviewed: 03/08/2020 ?Elsevier Patient Education ? [place]. ?Trigeminal Neuralgia ?Trigeminal neuralgia is a nerve disorder that causes severe pain on one side of the face. The pain may last from a few seconds to several minutes, but it can happen hundreds of times a day. The pain is usually only on one  side of the face. ?Symptoms may occur for days, weeks, or months and then go away for months or years. The pain may return and be worse than before. ?What are the causes? ?This condition may be caused by: ?Damage or pressure to a nerve in the head that is called the trigeminal nerve. An attack can be triggered by: ?Talking or chewing. ?Putting on makeup. ?Washing, shaving, or touching your face. ?Brushing your teeth. ?Blasts of hot or cold air. ?Primary demyelinating disorders, such as multiple sclerosis. ?Tumors. ?What increases the risk? ?You are more likely to develop this condition if: ?You are 99-59 years old. ?You are female. ?What are the signs or symptoms? ?The main symptom of this condition is severe pain in the jaw, lips, eyes, nose, scalp, forehead, and face. ?How is this diagnosed? ?This condition is diagnosed with a physical exam. A CT scan or an MRI may be done to rule out other conditions that can cause facial pain. ?How is this treated? ?This condition may be treated with: ?Measures to avoid the things that trigger your symptoms. ?Prescription medicines such as anticonvulsants. ?Procedures such as ablation, thermal, or radiation therapy. ?Cognitive or behavioral therapy. ?Complementary therapies such as: ?Gentle, regular exercise or yoga. ?Meditation. ?Aromatherapy. ?Acupuncture. ?Surgery. This may be done in severe cases if other medical treatment does not provide relief. ?It may take up to one month for treatment to start relieving the pain. ?Follow these instructions at home: ?Managing pain ? ?Learn as much as you can about how to manage your pain. Ask your health care provider if a pain specialist would be helpful. ?Consider talking with a mental health care provider about how to cope with the pain. ?Consider joining a pain support group. ?General instructions ?Take over-the-counter and prescription medicines only as told by your health care provider. ?Avoid the things that trigger your symptoms.  It may help to: ?Chew on the unaffected side of your mouth. ?Avoid touching your face. ?Avoid blasts of hot or cold air. ?Keep all follow-up visits. ?Where to find more information ?Facial Pain Association: facepain.org ?Contact a health care provider if: ?Your medicine is not helping your symptoms. ?You have side effects from the medicine used for treatment. ?You develop new, unexplained symptoms, such as: ?Double vision. ?Facial weakness or numbness. ?Changes in hearing or balance. ?You feel depressed. ?Get help right away if: ?Your pain is severe and is not getting better. ?You develop suicidal thoughts. ?If you ever feel like you may hurt yourself or others, or have thoughts about taking your own life, get help right away. Go to your nearest emergency department or: ?Call your local emergency services (911 in the [place] ?Call a suicide crisis helpline, such as the [facility] at (434)524-0766 or 988 in the [place]. This is open 24 hours a day in the [place] ?Text the Crisis Text Line at 904-420-9714 (in the [place].). ?Summary ?Trigeminal neuralgia is a nerve disorder that causes severe pain on one side of the face. The pain may last from a few seconds to several minutes. ?This condition is caused by damage or pressure to a nerve in the head that is called the trigeminal nerve. ?  Treatment may include avoiding the things that trigger your symptoms, taking medicines, or having procedures or surgery. It may take up to one month for treatment to start relieving the pain. ?Keep all follow-up visits. ?This information is not intended to replace advice given to you by your health care provider. Make sure you discuss any questions you have with your health care provider. ?Document Revised: 12/22/2020 Document Reviewed: 11/21/2020 ?Elsevier Patient Education ? [place]. ? ?

## 2021-09-05 NOTE — Progress Notes (Signed)
? ?Subjective:  ? ? Patient ID: Kara [name], female    DOB: 21-Oct-1970, 51 y.o.   MRN: [id number] ? ?50y/o caucasian divorced female established patient evaluation of headache left side frontal primarily but sometimes radiating into cheek and towards ear also.  Has been taking more yoga classes this month and tweaked her neck and wondering if that is causing her headaches.  Has not been taking her allergy medication yet for spring bloom.  Has noticed more congestion and some post nasal drip in the past week.  Denied fever/chills/arm/leg weakness/dysphasia/dyspnea/n/v/d/worst headache of her life.  Taking omeprazole 20mg  po BID prn (not every day for heartburn)  Still has pills remaining from [facility] dispense last year.  Food/pills getting stuck less frequently so did not schedule follow up with GI provider/referral.  Doesn't feel like she has sinus infection/URI.   ? ? ? ? ?Review of Systems  ?Constitutional:  Positive for activity change. Negative for appetite change, chills, diaphoresis, fatigue and fever.  ?HENT:  Positive for congestion, ear pain, postnasal drip, sinus pressure and trouble swallowing. Negative for drooling, ear discharge, facial swelling, hearing loss, mouth sores, nosebleeds, sinus pain, sneezing, sore throat, tinnitus and voice change.   ?Eyes:  Negative for photophobia and visual disturbance.  ?Respiratory:  Negative for cough, choking, chest tightness, shortness of breath, wheezing and stridor.   ?Cardiovascular:  Negative for chest pain.  ?Gastrointestinal:  Negative for diarrhea, nausea and vomiting.  ?Endocrine: Negative for cold intolerance and heat intolerance.  ?Genitourinary:  Negative for difficulty urinating.  ?Musculoskeletal:  Positive for myalgias and neck pain. Negative for gait problem and neck stiffness.  ?Skin:  Negative for color change and rash.  ?Allergic/Immunologic: Positive for environmental allergies. Negative for food allergies.  ?Neurological:  Positive for headaches.  Negative for dizziness, tremors, seizures, syncope, facial asymmetry, speech difficulty, weakness, light-headedness and numbness.  ?Hematological:  Negative for adenopathy. Does not bruise/bleed easily.  ?Psychiatric/Behavioral:  Negative for agitation, confusion and sleep disturbance.   ? ?   ?Objective:  ? Physical Exam ?Vitals and nursing note reviewed.  ?Constitutional:   ?   General: She is awake. She is not in acute distress. ?   Appearance: Normal appearance. She is well-developed, well-groomed and normal weight. She is not ill-appearing, toxic-appearing or diaphoretic.  ?HENT:  ?   Head: Normocephalic and atraumatic. No right periorbital erythema or left periorbital erythema.  ?   Jaw: There is normal jaw occlusion. No trismus.  ?   Salivary Glands: Right salivary gland is not diffusely enlarged or tender. Left salivary gland is not diffusely enlarged or tender.  ? ?   Right Ear: Hearing and external ear normal.  ?   Left Ear: Hearing and external ear normal.  ?   Nose: Mucosal edema, congestion and rhinorrhea present. No nasal deformity, septal deviation or laceration. Rhinorrhea is clear.  ?   Right Sinus: No maxillary sinus tenderness or frontal sinus tenderness.  ?   Left Sinus: Maxillary sinus tenderness and frontal sinus tenderness present.  ?   Mouth/Throat:  ?   Lips: Pink. No lesions.  ?   Mouth: Mucous membranes are moist. Mucous membranes are not pale, not dry and not cyanotic. No lacerations, oral lesions or angioedema.  ?   Dentition: No dental abscesses or gum lesions.  ?   Tongue: No lesions. Tongue does not deviate from midline.  ?   Palate: No mass and lesions.  ?   Pharynx: Uvula midline. Pharyngeal swelling and  posterior oropharyngeal erythema present. No oropharyngeal exudate or uvula swelling.  ?   Tonsils: No tonsillar exudate or tonsillar abscesses.  ?   Comments: Cobblestoning posterior pharynx; bilateral allergic shiners ?Eyes:  ?   General: Lids are normal. Vision grossly intact.  Gaze aligned appropriately. Allergic shiner present. No scleral icterus.    ?   Right eye: No foreign body, discharge or hordeolum.     ?   Left eye: No foreign body, discharge or hordeolum.  ?   Extraocular Movements: Extraocular movements intact.  ?   Right eye: Normal extraocular motion and no nystagmus.  ?   Left eye: Normal extraocular motion and no nystagmus.  ?   Conjunctiva/sclera: Conjunctivae normal.  ?   Right eye: Right conjunctiva is not injected. No chemosis, exudate or hemorrhage. ?   Left eye: Left conjunctiva is not injected. No chemosis, exudate or hemorrhage. ?   Pupils: Pupils are equal, round, and reactive to light. Pupils are equal.  ?   Right eye: Pupil is round and reactive.  ?   Left eye: Pupil is round and reactive.  ?Neck:  ?   Thyroid: No thyroid mass or thyromegaly.  ?   Trachea: Trachea and phonation normal. No tracheal tenderness or tracheal deviation.  ? ?   Comments: Left trapezius sometimes tight/sore after yoga ?Cardiovascular:  ?   Rate and Rhythm: Normal rate and regular rhythm.  ?   Pulses:     ?     Radial pulses are 2+ on the right side and 2+ on the left side.  ?Pulmonary:  ?   Effort: Pulmonary effort is normal. No accessory muscle usage or respiratory distress.  ?   Breath sounds: Normal breath sounds and air entry. No stridor or transmitted upper airway sounds. No decreased breath sounds, wheezing, rhonchi or rales.  ?Chest:  ?   Chest wall: No tenderness.  ?Abdominal:  ?   General: There is no distension.  ?   Palpations: Abdomen is soft.  ?Musculoskeletal:     ?   General: No tenderness. Normal range of motion.  ?   Right shoulder: No swelling, deformity, effusion, laceration or crepitus. Normal range of motion. Normal strength.  ?   Left shoulder: No swelling, deformity, effusion, laceration or crepitus. Normal range of motion. Normal strength.  ?   Right hand: Normal. No swelling or lacerations. Normal range of motion. Normal strength. Normal capillary refill.  ?    Left hand: Normal. No swelling or lacerations. Normal range of motion. Normal strength. Normal capillary refill.  ?   Cervical back: Normal range of motion and neck supple. No swelling, edema, deformity, erythema, signs of trauma, lacerations, rigidity, spasms, torticollis, tenderness, bony tenderness or crepitus. Pain with movement present. No spinous process tenderness or muscular tenderness. Normal range of motion.  ?   Thoracic back: No swelling, edema, deformity, signs of trauma or lacerations. Normal range of motion.  ?   Right hip: Normal.  ?   Left hip: Normal.  ?   Right knee: Normal.  ?   Left knee: Normal.  ?Lymphadenopathy:  ?   Head:  ?   Right side of head: No submental, submandibular, tonsillar, preauricular, posterior auricular or occipital adenopathy.  ?   Left side of head: No submental, submandibular, tonsillar, preauricular, posterior auricular or occipital adenopathy.  ?   Cervical: No cervical adenopathy.  ?   Right cervical: No superficial, deep or posterior cervical adenopathy. ?   Left  cervical: No superficial, deep or posterior cervical adenopathy.  ?Skin: ?   General: Skin is warm and dry.  ?   Capillary Refill: Capillary refill takes less than 2 seconds.  ?   Coloration: Skin is not ashen, cyanotic, jaundiced, mottled, pale or sallow.  ?   Findings: No abrasion, abscess, acne, bruising, burn, ecchymosis, erythema, signs of injury, laceration, lesion, petechiae, rash or wound.  ?   Nails: There is no clubbing.  ?Neurological:  ?   General: No focal deficit present.  ?   Mental Status: She is alert and oriented to person, place, and time. Mental status is at baseline. She is not disoriented.  ?   GCS: GCS eye subscore is 4. GCS verbal subscore is 5. GCS motor subscore is 6.  ?   Cranial Nerves: Cranial nerves 2-12 are intact. No cranial nerve deficit, dysarthria or facial asymmetry.  ?   Sensory: Sensation is intact. No sensory deficit.  ?   Motor: Motor function is intact. No weakness,  tremor, atrophy, abnormal muscle tone or seizure activity.  ?   Coordination: Coordination is intact. Coordination normal.  ?   Gait: Gait is intact. Gait normal.  ?   Comments: In/out of chair without diffic

## 2021-09-06 NOTE — Telephone Encounter (Signed)
See office note 09/05/21 ?

## 2021-09-14 ENCOUNTER — Ambulatory Visit: Payer: Self-pay | Admitting: *Deleted

## 2021-09-14 ENCOUNTER — Other Ambulatory Visit: Payer: Self-pay | Admitting: Registered Nurse

## 2021-09-14 VITALS — BP 122/86 | HR 82 | Ht 63.0 in | Wt 114.0 lb

## 2021-09-14 DIAGNOSIS — Z0000 Encounter for general adult medical examination without abnormal findings: Secondary | ICD-10-CM

## 2021-09-14 NOTE — Addendum Note (Signed)
Addended by: Loraine Grip on: 09/14/2021 03:59 PM ? ? Modules accepted: Orders ? ?

## 2021-09-14 NOTE — Progress Notes (Signed)
[employer] insurance premium discount evaluation: Labs Drawn. Replacements ROI form signed. Tobacco Free Attestation form signed.  Forms placed in paper chart.  

## 2021-09-15 LAB — CMP12+LP+TP+TSH+6AC+CBC/D/PLT
ALT: 8 IU/L (ref 0–32)
AST: 17 IU/L (ref 0–40)
Albumin/Globulin Ratio: 1.6 (ref 1.2–2.2)
Albumin: 4.3 g/dL (ref 3.8–4.8)
Alkaline Phosphatase: 47 IU/L (ref 44–121)
BUN/Creatinine Ratio: 14 (ref 9–23)
BUN: 12 mg/dL (ref 6–24)
Basophils Absolute: 0.1 10*3/uL (ref 0.0–0.2)
Basos: 1 %
Bilirubin Total: <0.2 mg/dL (ref 0.0–1.2)
Calcium: 8.6 mg/dL — ABNORMAL LOW (ref 8.7–10.2)
Chloride: 106 mmol/L (ref 96–106)
Chol/HDL Ratio: 2.8 ratio (ref 0.0–4.4)
Cholesterol, Total: 207 mg/dL — ABNORMAL HIGH (ref 100–199)
Creatinine, Ser: 0.84 mg/dL (ref 0.57–1.00)
EOS (ABSOLUTE): 0.1 10*3/uL (ref 0.0–0.4)
Eos: 1 %
Estimated CHD Risk: <0.5 times avg. (ref 0.0–1.0)
Free Thyroxine Index: 1.3 (ref 1.2–4.9)
GGT: 9 IU/L (ref 0–60)
Globulin, Total: 2.7 g/dL (ref 1.5–4.5)
Glucose: 95 mg/dL (ref 70–99)
HDL: 74 mg/dL (ref >39)
Hematocrit: 32.1 % — ABNORMAL LOW (ref 34.0–46.6)
Hemoglobin: 10.3 g/dL — ABNORMAL LOW (ref 11.1–15.9)
Immature Grans (Abs): 0 10*3/uL (ref 0.0–0.1)
Immature Granulocytes: 0 %
Iron: 21 ug/dL — ABNORMAL LOW (ref 27–159)
LDH: 182 IU/L (ref 119–226)
LDL Chol Calc (NIH): 126 mg/dL — ABNORMAL HIGH (ref 0–99)
Lymphocytes Absolute: 1.3 10*3/uL (ref 0.7–3.1)
Lymphs: 24 %
MCH: 26.3 pg — ABNORMAL LOW (ref 26.6–33.0)
MCHC: 32.1 g/dL (ref 31.5–35.7)
MCV: 82 fL (ref 79–97)
Monocytes Absolute: 0.2 10*3/uL (ref 0.1–0.9)
Monocytes: 4 %
Neutrophils Absolute: 3.8 10*3/uL (ref 1.4–7.0)
Neutrophils: 70 %
Phosphorus: 2.9 mg/dL — ABNORMAL LOW (ref 3.0–4.3)
Platelets: 340 10*3/uL (ref 150–450)
Potassium: 4.4 mmol/L (ref 3.5–5.2)
RBC: 3.92 x10E6/uL (ref 3.77–5.28)
RDW: 14.5 % (ref 11.7–15.4)
Sodium: 144 mmol/L (ref 134–144)
T3 Uptake Ratio: 21 % — ABNORMAL LOW (ref 24–39)
T4, Total: 6.3 ug/dL (ref 4.5–12.0)
TSH: 1.72 u[IU]/mL (ref 0.450–4.500)
Total Protein: 7 g/dL (ref 6.0–8.5)
Triglycerides: 40 mg/dL (ref 0–149)
Uric Acid: 3.2 mg/dL (ref 2.6–6.2)
VLDL Cholesterol Cal: 7 mg/dL (ref 5–40)
WBC: 5.5 10*3/uL (ref 3.4–10.8)
eGFR: 85 mL/min/{1.73_m2} (ref >59)

## 2021-09-15 LAB — VITAMIN D 25 HYDROXY (VIT D DEFICIENCY, FRACTURES): Vit D, 25-Hydroxy: 25.4 ng/mL — ABNORMAL LOW (ref 30.0–100.0)

## 2021-09-15 LAB — HGB A1C W/O EAG: Hgb A1c MFr Bld: 5.7 % — ABNORMAL HIGH (ref 4.8–5.6)

## 2021-09-17 ENCOUNTER — Encounter: Payer: Self-pay | Admitting: Registered Nurse

## 2021-09-17 ENCOUNTER — Telehealth: Payer: Self-pay | Admitting: Registered Nurse

## 2021-09-17 DIAGNOSIS — R030 Elevated blood-pressure reading, without diagnosis of hypertension: Secondary | ICD-10-CM

## 2021-09-17 DIAGNOSIS — D509 Iron deficiency anemia, unspecified: Secondary | ICD-10-CM

## 2021-09-17 DIAGNOSIS — E785 Hyperlipidemia, unspecified: Secondary | ICD-10-CM

## 2021-09-17 DIAGNOSIS — E559 Vitamin D deficiency, unspecified: Secondary | ICD-10-CM

## 2021-09-18 ENCOUNTER — Telehealth: Payer: Self-pay | Admitting: Family Medicine

## 2021-09-18 NOTE — Telephone Encounter (Signed)
Pt has scheduled an appointment for her physical on 10/11/21. Pt states that she did blood work labs for her job on 09/14/21 and the results should be in her 42. Pt is asking will those labs be sufficient for her physical. Please advise. ?

## 2021-09-18 NOTE — Telephone Encounter (Signed)
Pt has scheduled her CPE on 10/11/21 at 9:00.  ?

## 2021-09-19 NOTE — Telephone Encounter (Signed)
Called pt and no answer and pt's VM box if full, if pt calls back please let her know, that she doesn't need any lab work prior to CPE  ?

## 2021-09-19 NOTE — Telephone Encounter (Signed)
Latest Reference Range & Units 09/14/21 10:15  ?Sodium 134 - 144 mmol/L 144  ?Potassium 3.5 - 5.2 mmol/L 4.4  ?Chloride 96 - 106 mmol/L 106  ?Glucose 70 - 99 mg/dL 95  ?BUN 6 - 24 mg/dL 12  ?Creatinine 0.57 - 1.00 mg/dL 0.84  ?Calcium 8.7 - 10.2 mg/dL 8.6 (L)  ?BUN/Creatinine Ratio 9 - 23  14  ?eGFR >59 mL/min/1.73 85  ?Phosphorus 3.0 - 4.3 mg/dL 2.9 (L)  ?Alkaline Phosphatase 44 - 121 IU/L 47  ?Albumin 3.8 - 4.8 g/dL 4.3  ?Albumin/Globulin Ratio 1.2 - 2.2  1.6  ?Uric Acid 2.6 - 6.2 mg/dL 3.2  ?AST 0 - 40 IU/L 17  ?ALT 0 - 32 IU/L 8  ?Total Protein 6.0 - 8.5 g/dL 7.0  ?Total Bilirubin 0.0 - 1.2 mg/dL <0.2  ?GGT 0 - 60 IU/L 9  ?Estimated CHD Risk 0.0 - 1.0 times avg.  < 0.5  ?LDH 119 - 226 IU/L 182  ?Total CHOL/HDL Ratio 0.0 - 4.4 ratio 2.8  ?Cholesterol, Total 100 - 199 mg/dL 207 (H)  ?HDL Cholesterol >39 mg/dL 74  ?Triglycerides 0 - 149 mg/dL 40  ?VLDL Cholesterol Cal 5 - 40 mg/dL 7  ?LDL Chol Calc (NIH) 0 - 99 mg/dL 126 (H)  ?Iron 27 - 159 ug/dL 21 (L)  ?Vitamin D, 25-Hydroxy 30.0 - 100.0 ng/mL 25.4 (L)  ?Globulin, Total 1.5 - 4.5 g/dL 2.7  ?WBC 3.4 - 10.8 x10E3/uL 5.5  ?RBC 3.77 - 5.28 x10E6/uL 3.92  ?Hemoglobin 11.1 - 15.9 g/dL 10.3 (L)  ?HCT 34.0 - 46.6 % 32.1 (L)  ?MCV 79 - 97 fL 82  ?MCH 26.6 - 33.0 pg 26.3 (L)  ?MCHC 31.5 - 35.7 g/dL 32.1  ?RDW 11.7 - 15.4 % 14.5  ?Platelets 150 - 450 x10E3/uL 340  ?Neutrophils Not Estab. % 70  ?Immature Granulocytes Not Estab. % 0  ?NEUT# 1.4 - 7.0 x10E3/uL 3.8  ?Lymphocyte # 0.7 - 3.1 x10E3/uL 1.3  ?Monocytes Absolute 0.1 - 0.9 x10E3/uL 0.2  ?Basophils Absolute 0.0 - 0.2 x10E3/uL 0.1  ?Immature Grans (Abs) 0.0 - 0.1 x10E3/uL 0.0  ?Lymphs Not Estab. % 24  ?Monocytes Not Estab. % 4  ?Basos Not Estab. % 1  ?Eos Not Estab. % 1  ?EOS (ABSOLUTE) 0.0 - 0.4 x10E3/uL 0.1  ?Hemoglobin A1C 4.8 - 5.6 % 5.7 (H)  ?TSH 0.450 - 4.500 uIU/mL 1.720  ?Thyroxine (T4) 4.5 - 12.0 ug/dL 6.3  ?Free Thyroxine Index 1.2 - 4.9  1.3  ?T3 Uptake Ratio 24 - 39 % 21 (L)  ?(L): Data is abnormally  low ?(H): Data is abnormally high ? ?My chart message sent to patient vitamin d, calcium, phosphorus, H/H, T3 uptake and iron low.  Blood pressure, cholesterol total, LDL, elevated.  Recommended patient start prenatal vitamin ISP/USP tested daily or to ensure taking on regular basis if started but not consistent and vitamin D 50,000 units weekly by mouth and repeat vitamin D level nonfasting in 3 months.  I can send in Rx for vitamin D to her pharmacy preferred and to notify clinic staff her preference.  Iron deficiency anemia worsening.  Handout on iron rich foods and iron deficiency anemia, cholesterol, high fiber diet, low calcium, dash diet, preventing hypertension, calcium rich foods along with vitamin D deficiency sent to her mychart.  Encouraged patient to eat 1254m calcium per day and if unable to get that intake from food start tums generic to fill her deficit daily.  Ensure she is drinking water to  keep urine clear yellow tinged and voiding every 4 hours when awake.  Do not take calcium supplement with iron supplement but to space apart at least 2 hours.  Will attempt to speak with patient again when onsite 13 Apr. ? ?Will repeat Hgba1c when H/H improved/normal as anemia can affect Hgba1c results. ? ?Repeat calcium in 1 month and CBC.  Patient has PCM appt scheduled 10/11/21.  If not having known loss of blood heavy menses, blood in stool/urine.  If no colonoscopy in the past year I do recommend referral/completion due to anemia.  Repeat cholesterol in 6 months to a year.   ?

## 2021-09-19 NOTE — Telephone Encounter (Signed)
Those look good, thanks for the heads up  ?We will review them at her visit  ?No need for additional labs for now ?

## 2021-09-21 MED ORDER — CALCIUM CARBONATE ANTACID 500 MG PO CHEW: 1.0000 | CHEWABLE_TABLET | Freq: Every day | ORAL | 2 refills | Status: AC | PRN

## 2021-09-21 NOTE — Telephone Encounter (Signed)
Patient seen in workcenter.  She reported she read my chart message and reviewed lab results/has copy on her desk from [employer].  Menses just ended uses ultra tampons.  Periods used to be heavier than now.  Most likely the cause of her iron deficiency anemia.  But she also reported grandparent with colon cancer and has not had initial colon cancer screening yet.  Encouraged patient to pick provider and schedule as network providers [facility], [facility] and [facility].  See RN Rolly Salter if she wants provider printout.  Patient will monitor her calcium intake to ensure getting 1200mg  per day and if not taking tums prn to make up for deficiency.  Has not been taking her multivitamin on a regular basis. Emphasized low iron needs supplement due to blood loss with menses.  Repeat Hgba1c once anemia corrected.   Discussed starting prenatal vitamin.  Patient will send me email with picture of vitamin label.  Discussed cholesterol slightly elevated and HDL good 74 increased fiber in diet.  Consider follow up with GYN regarding heavy menses/blood loss e.g. consider contraception/procedure to decrease blood loss. ? ?Labs pending in 1 month calcium, CBC ?3 months Vitamin D ?6-12 months Hgba1c, cholesterol, iron ? ?Patient stated she will schedule colonoscopy with GI and follow up with PCM/Gyn.  Patient denied further questions at this time, verbalized understanding information/instructions and had no further questions at this time. ?

## 2021-09-26 ENCOUNTER — Other Ambulatory Visit: Payer: Self-pay | Admitting: Family Medicine

## 2021-10-03 NOTE — Telephone Encounter (Signed)
Appt pending with PCM this week.  Reiterated with patient follow up with GYN provider and repeat labs recommended 3 months. ?

## 2021-10-03 NOTE — Telephone Encounter (Signed)
Patient has appt with PCM later this week.  Will discuss GI referral/colon cancer screening. ?

## 2021-10-03 NOTE — Telephone Encounter (Signed)
Patient denied headaches and has PCM appt scheduled this week. ?

## 2021-10-11 ENCOUNTER — Ambulatory Visit (INDEPENDENT_AMBULATORY_CARE_PROVIDER_SITE_OTHER): Payer: No Typology Code available for payment source | Admitting: Family Medicine

## 2021-10-11 ENCOUNTER — Encounter: Payer: Self-pay | Admitting: Family Medicine

## 2021-10-11 VITALS — BP 122/80 | HR 78 | Temp 97.7°F | Ht 62.0 in | Wt 116.2 lb

## 2021-10-11 DIAGNOSIS — E785 Hyperlipidemia, unspecified: Secondary | ICD-10-CM

## 2021-10-11 DIAGNOSIS — Z1211 Encounter for screening for malignant neoplasm of colon: Secondary | ICD-10-CM

## 2021-10-11 DIAGNOSIS — Z0000 Encounter for general adult medical examination without abnormal findings: Secondary | ICD-10-CM

## 2021-10-11 DIAGNOSIS — F329 Major depressive disorder, single episode, unspecified: Secondary | ICD-10-CM

## 2021-10-11 DIAGNOSIS — D509 Iron deficiency anemia, unspecified: Secondary | ICD-10-CM

## 2021-10-11 DIAGNOSIS — R7303 Prediabetes: Secondary | ICD-10-CM

## 2021-10-11 DIAGNOSIS — E559 Vitamin D deficiency, unspecified: Secondary | ICD-10-CM

## 2021-10-11 DIAGNOSIS — B977 Papillomavirus as the cause of diseases classified elsewhere: Secondary | ICD-10-CM

## 2021-10-11 NOTE — Assessment & Plan Note (Signed)
Due for gyn f/u ?Had ? Colp in 2019  ? ?She plans to f/u and referral done  ?

## 2021-10-11 NOTE — Assessment & Plan Note (Signed)
Reviewed health habits including diet and exercise and skin cancer prevention ?Reviewed appropriate screening tests for age  ?Also reviewed health mt list, fam hx and immunization status , as well as social and family history   ?See HPI ?Labs reviewed  ?Ref to gyn for pap and visit  ?Mammogram is utd ?GI referral done for colonoscopy /screen ?Taking vitamin D for bone health  ?Planning to check on coverage of shingrix  ? ?

## 2021-10-11 NOTE — Assessment & Plan Note (Signed)
Referral done for screening colonoscopy  Pt will call to schedule   

## 2021-10-11 NOTE — Assessment & Plan Note (Signed)
Hb down to 10.3  ?Likely from heavy menses with perimenopause  ?Does not tolerate oral iron but can take mvi with it  ?Will continue pnv with iron and re check in 1-2 mo  ?Mildly symptomatic ? ?If not improved would need to consider IV iron  ?Colonoscopy ordered  ?Planning f/u with gyn for heavy menses also ?

## 2021-10-11 NOTE — Assessment & Plan Note (Addendum)
Stable with zoloft 100 mg daily but usually gets worse in the spring (unsure why) with anxiety and feeling of being overwhelmed ?Wishes to continue this dose and consider change if not improved with summer  ?? If possible reverse season SAD  ?No SI, not hopeless ? ?Reviewed stressors/ coping techniques/symptoms/ support sources/ tx options and side effects in detail today  ?

## 2021-10-11 NOTE — Assessment & Plan Note (Signed)
Disc goals for lipids and reasons to control them ?Rev last labs with pt ?Rev low sat fat diet in detail ?Diet controlled and improved HDL and LDL  ? ?

## 2021-10-11 NOTE — Assessment & Plan Note (Signed)
Lab Results  ?Component Value Date  ? HGBA1C 5.7 (H) 09/14/2021  ?stable  ? ?disc imp of low glycemic diet and wt loss to prevent DM2  ? ?

## 2021-10-11 NOTE — Patient Instructions (Addendum)
If you are interested in the shingles vaccine series (Shingrix), call your insurance or pharmacy to check on coverage and location it must be given.  If affordable - you can schedule it here or at your pharmacy depending on coverage  ? ?I will put order in for GI (colonoscopy)  ?You can call to schedule that  ? ?Get back to the gyn for routine visit  ? ?Continue your vitamin D ? ?Continue the prenatal vitamin with iron  (make sure it has iron)  ?Let's re check cbc in 1-2 months  ? ? ? ?Colonoscopies will be called according to date/time of the referral entry as these are not of urgent need.   ? ?[facility]  [phone] ? ? ? ? ?

## 2021-10-11 NOTE — Assessment & Plan Note (Signed)
D level is 25.4 ? ?Enc to continue 2000 iu D3 daily  ?Disc imp to bone and overall health  ?

## 2021-10-11 NOTE — Progress Notes (Signed)
? ?Subjective:  ? ? Patient ID: Kara [name], female    DOB: 03/19/1971, 51 y.o.   MRN: [id number] ? ?HPI ?Here for health maintenance exam and to review chronic medical problems   ? ?Wt Readings from Last 3 Encounters:  ?10/11/21 116 lb 4 oz (52.7 kg)  ?09/14/21 114 lb (51.7 kg)  ?09/20/20 116 lb (52.6 kg)  ? ?21.26 kg/m? ? ? ?Immunization History  ?Administered Date(s) Administered  ? HPV 9-valent 08/12/2018, 11/14/2018, 03/17/2019  ? Influenza Inj Mdck Quad Pf 03/24/2019  ? Influenza Split 04/07/2012  ? Influenza, Quadrivalent, Recombinant, Inj, Pf 04/23/2017  ? Influenza,inj,Quad PF,6+ Mos 08/03/2014, 04/20/2016, 02/12/2018, 03/14/2021  ? Influenza-Unspecified 03/24/2020  ? PFIZER(Purple Top)SARS-COV-2 Vaccination 09/07/2019, 09/30/2019, 04/09/2020  ? Tdap 07/15/2012  ? ? ?Colon cancer screening : wants to start colonoscopies  ? ?Mammogram 06/2021  ?Self breast exam - no lumps  ? ?Pap 04/2018  normal with HPV -was ref to gyn ?Periods are regular /heavy on and off in the past- causes anemia  ?Is all over the place ?Hot flashes-not too bad ?Night sweats are worse  ?Still on oral contraceptive -  ?Does not remember when last visit -had colposcopy  ? ? ?Supplements -started vit D  ?Also prenatal with iron in it  ?D level 25.4 ? ?Zoster status : tried to get shingrix/[facility] was out  ? ?BP Readings from Last 3 Encounters:  ?10/11/21 134/90  ?09/14/21 122/86  ?09/05/21 128/82  ? ?Pulse Readings from Last 3 Encounters:  ?10/11/21 78  ?09/14/21 82  ?09/05/21 79  ? ?Lab Results  ?Component Value Date  ? CREATININE 0.84 09/14/2021  ? BUN 12 09/14/2021  ? NA 144 09/14/2021  ? K 4.4 09/14/2021  ? CL 106 09/14/2021  ? CO2 28 07/21/2020  ? ?Lab Results  ?Component Value Date  ? ALT 8 09/14/2021  ? AST 17 09/14/2021  ? GGT 9 09/14/2021  ? ALKPHOS 47 09/14/2021  ? BILITOT <0.2 09/14/2021  ?  ? ?Depression  ?Sertraline 100 mg daily  ?Tends to be worse in the spring every year  (may have to do with the time change)  ?She does  worry about taxes quite a bit  ?She does have seasonal allergies - takes zyrtec as needed  ?Notes -she feels overwhelmed easily (this happened on a camping trip with son)  ? ?Feels better now (than she did)  ?Took a little bit of time off  ?Has to plan for self care  ? ? ? ?H/o iron def anemia ?Lab Results  ?Component Value Date  ? WBC 5.5 09/14/2021  ? HGB 10.3 (L) 09/14/2021  ? HCT 32.1 (L) 09/14/2021  ? MCV 82 09/14/2021  ? PLT 340 09/14/2021  ? ?Lab Results  ?Component Value Date  ? IRON 21 (L) 09/14/2021  ? FERRITIN 6.0 (L) 07/21/2020  ?Just started pnv with iron a week ago  ?Does not tolerate iron pills well  ?Is tired ? ? ?Hyperlipidemia ?Lab Results  ?Component Value Date  ? CHOL 207 (H) 09/14/2021  ? CHOL 242 (H) 07/21/2020  ? CHOL 220 (H) 07/24/2019  ? ?Lab Results  ?Component Value Date  ? HDL 74 09/14/2021  ? HDL 67.70 07/21/2020  ? HDL 72.90 07/24/2019  ? ?Lab Results  ?Component Value Date  ? LDLCALC 126 (H) 09/14/2021  ? LDLCALC 162 (H) 07/21/2020  ? LDLCALC 133 (H) 07/24/2019  ? ?Lab Results  ?Component Value Date  ? TRIG 40 09/14/2021  ? TRIG 57.0 07/21/2020  ?  TRIG 70.0 07/24/2019  ? ?Lab Results  ?Component Value Date  ? CHOLHDL 2.8 09/14/2021  ? CHOLHDL 4 07/21/2020  ? CHOLHDL 3 07/24/2019  ? ?Lab Results  ?Component Value Date  ? LDLDIRECT 141.4 07/10/2013  ? ?Diet controlled  ?Eating well/ now better than she was  ?Uses olive oil  ?Not as much exercise / does like yoga and tracking steps (aware of that)  ? ?Prediabetes ?Lab Results  ?Component Value Date  ? HGBA1C 5.7 (H) 09/14/2021  ? ?This is stable  ? ?Patient Active Problem List  ? Diagnosis Date Noted  ? Vitamin D deficiency 10/11/2021  ? Colon cancer screening 07/28/2020  ? Mild hyperlipidemia 07/28/2019  ? Cervical intraepithelial neoplasia grade III with severe dysplasia 07/18/2018  ? 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  ? Encounter for annual routine gynecological examination 05/05/2018  ? Prediabetes 05/05/2018  ? Iron deficiency  anemia 05/05/2018  ? Abnormal mammogram of left breast 03/24/2018  ? Premenstrual dysphoric disorder 02/12/2018  ? Allergic rhinitis 02/12/2018  ? External hemorrhoids 02/12/2018  ? Routine general medical examination at a health care facility 07/15/2012  ? Depression 04/09/2011  ? ?Past Medical History:  ?Diagnosis Date  ? Depression   ? Mitral valve prolapse   ? ?Past Surgical History:  ?Procedure Laterality Date  ? BREAST CYST ASPIRATION Right 11/02/2020  ? ?Social History  ? ?Tobacco Use  ? Smoking status: Never  ? Smokeless tobacco: Never  ?Substance Use Topics  ? Alcohol use: Yes  ?  Alcohol/week: 0.0 standard drinks  ?  Comment: occasional  ? Drug use: Never  ? ?Family History  ?Problem Relation Age of Onset  ? Breast cancer Maternal Aunt   ? Cancer Maternal Aunt 71  ?     breast  ? Breast cancer Paternal Grandmother   ? ?Allergies  ?Allergen Reactions  ? Sulfa Antibiotics Shortness Of Breath  ? ?Current Outpatient Medications on File Prior to Visit  ?Medication Sig Dispense Refill  ? calcium carbonate (TUMS - DOSED IN MG ELEMENTAL CALCIUM) 500 MG chewable tablet Chew 1 tablet (200 mg of elemental calcium total) by mouth daily as needed for indigestion or heartburn (if less than 1200mg  intake calcium in food). 30 tablet 2  ? cetirizine (ZYRTEC) 10 MG tablet Take 1 tablet (10 mg total) by mouth daily. Take by mouth daily as needed 30 tablet 11  ? Prenatal Vit-Fe Fumarate-FA (PRENATAL PO) Take 1 capsule by mouth daily.    ? sertraline (ZOLOFT) 100 MG tablet TAKE 1 TABLET BY MOUTH EVERY DAY 90 tablet 0  ? SRONYX 0.1-20 MG-MCG tablet TAKE 1 TABLET BY MOUTH EVERY DAY 84 tablet 3  ? omeprazole (PRILOSEC) 20 MG capsule Take 1 capsule (20 mg total) by mouth 2 (two) times daily as needed. (Patient not taking: Reported on 10/11/2021) 90 capsule 0  ? ?No current facility-administered medications on file prior to visit.  ?  ? ?Review of Systems  ?Constitutional:  Negative for activity change, appetite change, fatigue,  fever and unexpected weight change.  ?HENT:  Negative for congestion, ear pain, rhinorrhea, sinus pressure and sore throat.   ?Eyes:  Negative for pain, redness and visual disturbance.  ?Respiratory:  Negative for cough, shortness of breath and wheezing.   ?Cardiovascular:  Negative for chest pain and palpitations.  ?Gastrointestinal:  Negative for abdominal pain, blood in stool, constipation and diarrhea.  ?Endocrine: Negative for polydipsia and polyuria.  ?     Menopausal symptoms   ?Genitourinary:  Negative for  dysuria, frequency and urgency.  ?Musculoskeletal:  Negative for arthralgias, back pain and myalgias.  ?Skin:  Negative for pallor and rash.  ?Allergic/Immunologic: Negative for environmental allergies.  ?Neurological:  Negative for dizziness, syncope and headaches.  ?Hematological:  Negative for adenopathy. Does not bruise/bleed easily.  ?Psychiatric/Behavioral:  Positive for dysphoric mood. Negative for decreased concentration. The patient is nervous/anxious.   ? ?   ?Objective:  ? Physical Exam ?Constitutional:   ?   General: She is not in acute distress. ?   Appearance: Normal appearance. She is well-developed and normal weight. She is not ill-appearing or diaphoretic.  ?HENT:  ?   Head: Normocephalic and atraumatic.  ?   Right Ear: Tympanic membrane, ear canal and external ear normal.  ?   Left Ear: Tympanic membrane, ear canal and external ear normal.  ?   Nose: Nose normal. No congestion.  ?   Mouth/Throat:  ?   Mouth: Mucous membranes are moist.  ?   Pharynx: Oropharynx is clear. No posterior oropharyngeal erythema.  ?Eyes:  ?   General: No scleral icterus. ?   Extraocular Movements: Extraocular movements intact.  ?   Conjunctiva/sclera: Conjunctivae normal.  ?   Pupils: Pupils are equal, round, and reactive to light.  ?Neck:  ?   Thyroid: No thyromegaly.  ?   Vascular: No carotid bruit or JVD.  ?Cardiovascular:  ?   Rate and Rhythm: Normal rate and regular rhythm.  ?   Pulses: Normal pulses.  ?    Heart sounds: Normal heart sounds.  ?  No gallop.  ?Pulmonary:  ?   Effort: Pulmonary effort is normal. No respiratory distress.  ?   Breath sounds: Normal breath sounds. No wheezing.  ?   Comments: Good air exc

## 2021-10-23 ENCOUNTER — Other Ambulatory Visit: Payer: Self-pay | Admitting: Family Medicine

## 2021-10-24 ENCOUNTER — Telehealth: Payer: Self-pay | Admitting: Family Medicine

## 2021-10-25 MED ORDER — LEVONORGESTREL-ETHINYL ESTRAD 0.1-20 MG-MCG PO TABS: 1.0000 | ORAL_TABLET | Freq: Every day | ORAL | 3 refills | Status: DC

## 2021-10-25 NOTE — Addendum Note (Signed)
Addended by: Tammi Sou on: 10/25/2021 03:29 PM ? ? Modules accepted: Orders ? ?

## 2021-10-25 NOTE — Telephone Encounter (Signed)
It was a duplicate refill request PCP had filled med in April 2023 but checking with pharmacy they never received that refill request so Rx resent to pharmacy and left VM letting pt know Rx was sent in today  ?

## 2021-10-25 NOTE — Telephone Encounter (Signed)
Pt has called the office wondering why the prescription was not approved. She was last seen on 5.2.23 and this was a medication they did discuss her taking. Please advise  ?

## 2022-01-17 ENCOUNTER — Other Ambulatory Visit: Payer: Self-pay | Admitting: Family Medicine

## 2022-01-17 NOTE — Telephone Encounter (Signed)
Last filled  10/16/21 Last ov 10/11/21

## 2022-02-04 ENCOUNTER — Telehealth: Payer: Self-pay | Admitting: Registered Nurse

## 2022-02-04 DIAGNOSIS — S60319A Abrasion of unspecified thumb, initial encounter: Secondary | ICD-10-CM

## 2022-02-04 MED ORDER — TRIPLE ANTIBIOTIC 5-400-5000 EX OINT: TOPICAL_OINTMENT | Freq: Two times a day (BID) | CUTANEOUS | 0 refills | Status: AC

## 2022-02-04 MED ORDER — AQUAPHOR EX OINT: TOPICAL_OINTMENT | CUTANEOUS | 0 refills | Status: AC | PRN

## 2022-02-04 NOTE — Telephone Encounter (Signed)
Patient reported she has been picking at rash on thumb and now skin open.  Wondering if fungal rash.  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  Discussed with patient due to dry/scaley skin I first recommend applying aquaphor or triple antibiotic ointment BID prn skin dryness/irritation and cover with elastic finger bandage so she cannot pick at it.  Follow up with me next week for re-evaluation.  Apply emollient after bathing.  Do not leave wet bandaid in place change prn during the day.  No discharge, mild erythema at edges of disrupted epithelial layer skin distal and thumb pad, no swelling.  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  Denied fever/chills/tingling/numbness.  Discussed oils are stripped from skin when handling clothes for hours at a time.  Important to replace at night prior to bed after bathing to lock in moisture/prevent skin barrier from cracking.  Could be contact irritant dermatitis as clothes donated/bought from many sources laundered by different methods.  Patient verbalized understanding information/instructions, agreed with plan of care and had no further questions at this time.

## 2022-02-07 NOTE — Telephone Encounter (Signed)
Patient seen in [facility].  Reported plan of care is helping decrease irritation of thumb skin/abrasion healing.  She will follow up if signs of infection or not healing completely.  Denied further needs or concerns at this time.

## 2022-03-17 ENCOUNTER — Ambulatory Visit (HOSPITAL_COMMUNITY)
Admission: EM | Admit: 2022-03-17 | Discharge: 2022-03-17 | Disposition: A | Discharge status: Home / Self Care | Payer: No Typology Code available for payment source | Attending: Nurse Practitioner | Admitting: Nurse Practitioner

## 2022-03-17 DIAGNOSIS — S161XXA Strain of muscle, fascia and tendon at neck level, initial encounter: Secondary | ICD-10-CM

## 2022-03-17 MED ORDER — TIZANIDINE HCL 4 MG PO TABS: 4.0000 mg | ORAL_TABLET | Freq: Every evening | ORAL | 0 refills | Status: DC | PRN

## 2022-03-17 MED ORDER — IBUPROFEN 800 MG PO TABS: 800.0000 mg | ORAL_TABLET | Freq: Three times a day (TID) | ORAL | 0 refills | Status: DC | PRN
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
## 2022-04-08 ENCOUNTER — Other Ambulatory Visit: Payer: Self-pay | Admitting: Nurse Practitioner

## 2022-04-09 NOTE — Telephone Encounter (Signed)
Requested medication (s) are due for refill today: yes  Requested medication (s) are on the active medication list: yes  Last refill:  03/19/22  Future visit scheduled: yes  Notes to clinic:  Unable to refill per protocol, cannot delegate.      Requested Prescriptions  Pending Prescriptions Disp Refills   tiZANidine (ZANAFLEX) 4 MG tablet [Pharmacy Med Name: TIZANIDINE HCL 4 MG TABLET] 90 tablet 1    Sig: TAKE 1 TABLET BY MOUTH AT BEDTIME AS NEEDED FOR MUSCLE SPASMS. 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 MAY CAUSE DROWSINESS     Not Delegated - Cardiovascular:  Alpha-2 [facility] - tizanidine Failed - 04/08/2022 11:30 AM      Failed - This refill cannot be delegated      Failed - Valid encounter within last 6 months    Recent Outpatient Visits   None

## 2022-04-19 ENCOUNTER — Ambulatory Visit: Payer: No Typology Code available for payment source | Admitting: Family Medicine

## 2022-04-19 ENCOUNTER — Encounter: Payer: Self-pay | Admitting: Family Medicine

## 2022-04-19 VITALS — BP 136/72 | HR 82 | Temp 98.2°F | Ht 62.0 in | Wt 116.0 lb

## 2022-04-19 DIAGNOSIS — B977 Papillomavirus as the cause of diseases classified elsewhere: Secondary | ICD-10-CM | POA: Diagnosis not present

## 2022-04-19 DIAGNOSIS — Z1211 Encounter for screening for malignant neoplasm of colon: Secondary | ICD-10-CM

## 2022-04-19 DIAGNOSIS — Z113 Encounter for screening for infections with a predominantly sexual mode of transmission: Secondary | ICD-10-CM | POA: Diagnosis not present

## 2022-04-19 NOTE — Assessment & Plan Note (Signed)
Referral made go GI and pt plans to schedule it

## 2022-04-19 NOTE — Assessment & Plan Note (Signed)
Overdue for f/u with gyn  Rev last note from [facility]  Referral done

## 2022-04-19 NOTE — Patient Instructions (Signed)
Lab for std screen today   If you develop symptoms at any time let us know   I placed a referral to GI for colonoscopy  Call [facility] to schedule  [facility]  [phone]  I placed a referral also for gyn  If you don't get a call in 1-2 weeks let us know

## 2022-04-19 NOTE — Progress Notes (Signed)
Subjective:    Patient ID: Kara [name], female    DOB: [birth year], 51 y.o.   MRN: [id number]  HPI Pt presents with request for STD screening   Wt Readings from Last 3 Encounters:  04/19/22 116 lb (52.6 kg)  10/11/21 116 lb 4 oz (52.7 kg)  09/14/21 114 lb (51.7 kg)   21.22 kg/m  New sexual partner and wants to be tested  No gyn symptoms at all   Past h/o HPV and abn pap  Is due for a visit - [facility]   Takes OC 1/20 Some hot flases Period is still regular   Has declined HPV vaccine   Mammogram 06/2021 up to date   Patient Active Problem List   Diagnosis Date Noted   Colon cancer screening 04/19/2022   Vitamin D deficiency 10/11/2021   Mitral valve prolapse 08/06/2020   History of depression 08/06/2020   Screening for STD (sexually transmitted disease) 07/28/2020   Mild hyperlipidemia 07/28/2019   Cervical intraepithelial neoplasia grade III with severe dysplasia 07/18/2018   Diabetes mellitus (HCC) 07/18/2018   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   Encounter for annual routine gynecological examination 05/05/2018   Prediabetes 05/05/2018   Iron deficiency anemia 05/05/2018   Abnormal mammogram of left breast 03/24/2018   Premenstrual dysphoric disorder 02/12/2018   Allergic rhinitis 02/12/2018   External hemorrhoids 02/12/2018   Routine general medical examination at a health care facility 07/15/2012   Depression 04/09/2011   Past Medical History:  Diagnosis Date   Depression    Mitral valve prolapse    Past Surgical History:  Procedure Laterality Date   BREAST CYST ASPIRATION Right 11/02/2020   Social History   Tobacco Use   Smoking status: Never   Smokeless tobacco: Never  Substance Use Topics   Alcohol use: Yes    Alcohol/week: 0.0 standard drinks of alcohol    Comment: occasional   Drug use: Never   Family History  Problem Relation Age of Onset   Breast cancer Maternal Aunt    Cancer Maternal Aunt 55       breast   Breast  cancer Paternal Grandmother    Allergies  Allergen Reactions   Sulfa Antibiotics Shortness Of Breath   Current Outpatient Medications on File Prior to Visit  Medication Sig Dispense Refill   cetirizine (ZYRTEC) 10 MG tablet Take 1 tablet (10 mg total) by mouth daily. Take by mouth daily as needed 30 tablet 11   ibuprofen (ADVIL) 800 MG tablet Take 1 tablet (800 mg total) by mouth every 8 (eight) hours as needed. Take with food to prevent GI upset 21 tablet 0   levonorgestrel-ethinyl estradiol (SRONYX) 0.1-20 MG-MCG tablet Take 1 tablet by mouth daily. 84 tablet 3   Prenatal Vit-Fe Fumarate-FA (PRENATAL PO) Take 1 capsule by mouth daily.     sertraline (ZOLOFT) 100 MG tablet TAKE 1 TABLET BY MOUTH EVERY DAY 90 tablet 2   tiZANidine (ZANAFLEX) 4 MG tablet Take 1 tablet (4 mg total) by mouth at bedtime as needed for muscle spasms. Do not take with alcohol or while driving or operating heavy machinery.  May cause drowsiness 30 tablet 0   No current facility-administered medications on file prior to visit.     Review of Systems  Constitutional:  Negative for activity change, appetite change, fatigue, fever and unexpected weight change.  HENT:  Negative for congestion, ear pain, rhinorrhea, sinus pressure and sore throat.   Eyes:  Negative for  pain, redness and visual disturbance.  Respiratory:  Negative for cough, shortness of breath and wheezing.   Cardiovascular:  Negative for chest pain and palpitations.  Gastrointestinal:  Negative for abdominal pain, blood in stool, constipation and diarrhea.  Endocrine: Negative for polydipsia and polyuria.  Genitourinary:  Negative for dysuria, frequency and urgency.  Musculoskeletal:  Negative for arthralgias, back pain and myalgias.  Skin:  Negative for pallor and rash.  Allergic/Immunologic: Negative for environmental allergies.  Neurological:  Negative for dizziness, syncope and headaches.  Hematological:  Negative for adenopathy. Does not  bruise/bleed easily.  Psychiatric/Behavioral:  Negative for decreased concentration and dysphoric mood. The patient is not nervous/anxious.        Objective:   Physical Exam Constitutional:      General: She is not in acute distress.    Appearance: Normal appearance. She is normal weight. She is not ill-appearing.  Eyes:     General:        Right eye: No discharge.        Left eye: No discharge.     Conjunctiva/sclera: Conjunctivae normal.     Pupils: Pupils are equal, round, and reactive to light.  Cardiovascular:     Rate and Rhythm: Normal rate and regular rhythm.     Heart sounds: Normal heart sounds.  Pulmonary:     Effort: Pulmonary effort is normal. No respiratory distress.     Breath sounds: No rales.  Abdominal:     Comments: No suprapubic tenderness or fullness    Lymphadenopathy:     Cervical: No cervical adenopathy.  Skin:    General: Skin is warm and dry.  Neurological:     Mental Status: She is alert.  Psychiatric:        Mood and Affect: Mood normal.           Assessment & Plan:   Problem List Items Addressed This Visit       Other   Colon cancer screening    Referral made go [facility] and pt plans to schedule it       Relevant Orders   Ambulatory referral to Gastroenterology   High risk HPV infection    Overdue for f/u with gyn  [facility] from [facility]  Referral done       Relevant Orders   Ambulatory referral to Gynecology   Screening for STD (sexually transmitted disease) - Primary    Pt wants testing before starting new relationship  No symptoms at all  Uses condoms much of the time/not every time  Disc safe sexual practices   STD screen ordered  Will update if she develops any symptoms       Relevant Orders   Hepatitis C antibody   HIV Antibody (routine testing w rflx)   C. trachomatis/N. gonorrhoeae RNA

## 2022-04-19 NOTE — Assessment & Plan Note (Signed)
Pt wants testing before starting new relationship  No symptoms at all  Uses condoms much of the time/not every time  Disc safe sexual practices   STD screen ordered  Will update if she develops any symptoms

## 2022-04-20 LAB — C. TRACHOMATIS/N. GONORRHOEAE RNA
C. trachomatis RNA, TMA: NOT DETECTED
N. gonorrhoeae RNA, TMA: NOT DETECTED

## 2022-04-20 LAB — HIV ANTIBODY (ROUTINE TESTING W REFLEX): HIV 1&2 Ab, 4th Generation: NONREACTIVE

## 2022-04-20 LAB — HEPATITIS C ANTIBODY: Hepatitis C Ab: NONREACTIVE

## 2022-04-30 ENCOUNTER — Encounter: Payer: Self-pay | Admitting: Registered Nurse

## 2022-04-30 ENCOUNTER — Telehealth: Payer: Self-pay | Admitting: Registered Nurse

## 2022-04-30 DIAGNOSIS — W010XXA Fall on same level from slipping, tripping and stumbling without subsequent striking against object, initial encounter: Secondary | ICD-10-CM

## 2022-04-30 NOTE — Telephone Encounter (Signed)
Patient requested appt for evaluation after slip on tile floor 4 days ago and hit head.  Feeling a little woozy today was more active yesterday cleaning.  Applied ice immediately after injury and rested 24 hours after.  Appt booked for 0900 05/01/22 first available NP clinic.  Exitcare handout on head injury sent to patient my chart.

## 2022-05-01 ENCOUNTER — Ambulatory Visit: Payer: Self-pay | Admitting: Registered Nurse

## 2022-05-01 ENCOUNTER — Encounter: Payer: Self-pay | Admitting: Registered Nurse

## 2022-05-01 VITALS — BP 143/91 | HR 84 | Temp 99.0°F

## 2022-05-01 DIAGNOSIS — S134XXD Sprain of ligaments of cervical spine, subsequent encounter: Secondary | ICD-10-CM

## 2022-05-01 DIAGNOSIS — D509 Iron deficiency anemia, unspecified: Secondary | ICD-10-CM

## 2022-05-01 DIAGNOSIS — E559 Vitamin D deficiency, unspecified: Secondary | ICD-10-CM

## 2022-05-01 DIAGNOSIS — W19XXXD Unspecified fall, subsequent encounter: Secondary | ICD-10-CM

## 2022-05-01 DIAGNOSIS — S060X9A Concussion with loss of consciousness of unspecified duration, initial encounter: Secondary | ICD-10-CM

## 2022-05-01 DIAGNOSIS — R7309 Other abnormal glucose: Secondary | ICD-10-CM

## 2022-05-01 NOTE — Patient Instructions (Addendum)
Head Injury, Adult There are many types of head injuries. Head injuries can be as minor as a small bump, or they can be a serious medical issue. More severe head injuries include: A jarring injury to the brain (concussion). A bruise (contusion) of the brain. This means there is bleeding in the brain that can cause swelling. A cracked skull (skull fracture). Bleeding in the brain that collects, clots, and forms a bump (hematoma). After a head injury, most problems occur within the first 24 hours, but side effects may occur up to 7-10 days after the injury. It is important to watch your condition for any changes. You may need to be observed in the emergency department or urgent care, or you may be admitted to the hospital. What are the causes? There are many possible causes of a head injury. Serious head injuries may be caused by car accidents, bicycle or motorcycle accidents, sports injuries, falls, or being struck by an object. What are the symptoms? Symptoms of a head injury include a contusion, bump, or bleeding at the site of the injury. Other physical symptoms may include: Headache. Nausea or vomiting. Dizziness. Blurred or double vision. Being uncomfortable around bright lights or loud noises. Seizures. Feeling tired. Trouble being awakened. Loss of consciousness. Mental or emotional symptoms may include: Irritability. Confusion and memory problems. Poor attention and concentration. Changes in eating or sleeping habits. Anxiety or depression. How is this diagnosed? This condition can usually be diagnosed based on your symptoms, a description of the injury, and a physical exam. You may also have imaging tests done, such as a CT scan or an MRI. How is this treated? Treatment for this condition depends on the severity and type of injury you have. The main goal of treatment is to prevent complications and allow the brain time to heal. Mild head injury If you have a mild head injury,  you may be sent home, and treatment may include: Observation. A responsible adult should stay with you for 24 hours after your injury and check on you often. Physical rest. Brain rest. Pain medicines. Severe head injury If you have a severe head injury, treatment may include: Close observation. This includes hospitalization with the following care: Frequent physical exams. Frequent checks of how your brain and nervous system are working (neurological status). Checking your blood pressure and oxygen levels. Medicines to relieve pain, prevent seizures, and decrease brain swelling. Airway protection and breathing support. This may include using a ventilator. Treatments that monitor and manage swelling inside the brain. Brain surgery. This may be needed to: Remove a collection of blood or blood clots. Stop the bleeding. Remove a part of the skull to allow room for the brain to swell. Follow these instructions at home: Activity Rest and avoid activities that are physically hard or tiring. Make sure you get enough sleep. Let your brain rest by limiting activities that require a lot of thought or attention, such as: Watching TV. Playing memory games and puzzles. Job-related work or homework. Working on the computer, using social media, and texting. Avoid activities that could cause another head injury, such as playing sports, until your health care provider approves. Having another head injury, especially before the first one has healed, can be dangerous. Ask your health care provider when it is safe for you to return to your regular activities, including work or school. Ask your health care provider for a step-by-step plan for gradually returning to activities. Ask your health care provider when you can drive,   ride a bicycle, or use heavy machinery. Your ability to react may be slower after a brain injury. Do not do these activities if you are dizzy. Lifestyle  Do not drink alcohol until  your health care provider approves. Do not use drugs. Alcohol and certain drugs may slow your recovery and can put you at risk of further injury. If it is harder than usual to remember things, write them down. If you are easily distracted, try to do one thing at a time. Talk with family members or close friends when making important decisions. Tell your friends, family, a trusted colleague, and work manager about your injury, symptoms, and restrictions. Have them watch for any new or worsening problems. General instructions Take over-the-counter and prescription medicines only as told by your health care provider. Have someone stay with you for 24 hours after your head injury. This person should watch you for any changes in your symptoms and be ready to seek medical help. Keep all follow-up visits as told by your health care provider. This is important. How is this prevented? Work on improving your balance and strength to avoid falls. Wear a seat belt when you are in a moving vehicle. Wear a helmet when riding a bicycle, skiing, or doing any other sport or activity that has a risk of injury. If you drink alcohol: Limit how much you use to: 0-1 drink a day for nonpregnant women. 0-2 drinks a day for men. Be aware of how much alcohol is in your drink. In the [place] one drink equals one 12 oz bottle of beer (355 mL), one 5 oz glass of wine (148 mL), or one 1 oz glass of hard liquor (44 mL). Take safety measures in your home, such as: Removing clutter and tripping hazards from floors and stairways. Using grab bars in bathrooms and handrails by stairs. Placing non-slip mats on floors and in bathtubs. Improving lighting in dim areas. Where to find more information Centers for Disease Control and Prevention: www.cdc.gov Get help right away if: You have: A severe headache that is not helped by medicine. Trouble walking or weakness in your arms and legs. Clear or bloody fluid coming from your  nose or ears. Changes in your vision. A seizure. Increased confusion or irritability. Your symptoms get worse. You are sleepier than normal and have trouble staying awake. You lose your balance. Your pupils change size. Your speech is slurred. Your dizziness gets worse. You vomit. These symptoms may represent a serious problem that is an emergency. Do not wait to see if the symptoms will go away. Get medical help right away. Call your local emergency services (911 in the [place] Do not drive yourself to the hospital. Summary Head injuries can be minor, or they can be a serious medical issue requiring immediate attention. Treatment for this condition depends on the severity and type of injury you have. Have someone stay with you for 24 hours after your injury and check on you often. Ask your health care provider when it is safe for you to return to your regular activities, including work or school. Head injury prevention includes wearing a seat belt in a motor vehicle, using a helmet on a bicycle, limiting alcohol use, and taking safety measures in your home. This information is not intended to replace advice given to you by your health care provider. Make sure you discuss any questions you have with your health care provider. Document Revised: 04/10/2019 Document Reviewed: 04/10/2019 Elsevier Patient Education  2023   [employer]  Cervical Strain and Sprain Rehab Ask your health care provider which exercises are safe for you. Do exercises exactly as told by your health care provider and adjust them as directed. It is normal to feel mild stretching, pulling, tightness, or discomfort as you do these exercises. Stop right away if you feel sudden pain or your pain gets worse. Do not begin these exercises until told by your health care provider. Stretching and range-of-motion exercises Cervical side bending  Using good posture, sit on a stable chair or stand up. Without moving your shoulders,  slowly tilt your left / right ear to your shoulder until you feel a stretch in the neck muscles on the opposite side. You should be looking straight ahead. Hold for ____5-15______ seconds. Repeat with the other side of your neck. Repeat ____3______ times. Complete this exercise _____2_____ times a day. Cervical rotation  Using good posture, sit on a stable chair or stand up. Slowly turn your head to the side as if you are looking over your left / right shoulder. Keep your eyes level with the ground. Stop when you feel a stretch along the side and the back of your neck. Hold for ___5-15_______ seconds. Repeat this by turning to your other side. Repeat ____3______ times. Complete this exercise ____2______ times a day. Thoracic extension and pectoral stretch  Roll a towel or a small blanket so it is about 4 inches (10 cm) in diameter. Lie down on your back on a firm surface. Put the towel in the middle of your back across your spine. It should not be under your shoulder blades. Put your hands behind your head and let your elbows fall out to your sides. Hold for _____5-15_____ seconds. Repeat ____3______ times. Complete this exercise ______2____ times a day. Strengthening exercises Upper cervical flexion  Lie on your back with a thin pillow behind your head or a small, rolled-up towel under your neck. Gently tuck your chin toward your chest and nod your head down to look toward your feet. Do not lift your head off the pillow. Hold for ___5-15_______ seconds. Release the tension slowly. Relax your neck muscles completely before you repeat this exercise. Repeat _______3___ times. Complete this exercise ____2______ times a day. Cervical extension  Stand about 6 inches (15 cm) away from a wall, with your back facing the wall. Place a soft object, about 6-8 inches (15-20 cm) in diameter, between the back of your head and the wall. A soft object could be a small pillow, a ball, or a folded  towel. Gently tilt your head back and press into the soft object. Keep your jaw and forehead relaxed. Hold for ___5-15_______ seconds. Release the tension slowly. Relax your neck muscles completely before you repeat this exercise. Repeat ______3____ times. Complete this exercise _____2_____ times a day. Posture and body mechanics Body mechanics refer to the movements and positions of your body while you do your daily activities. Posture is part of body mechanics. Good posture and healthy body mechanics can help to relieve stress in your body's tissues and joints. Good posture means that your spine is in its natural S-curve position (your spine is neutral), your shoulders are pulled back slightly, and your head is not tipped forward. The following are general guidelines for using improved posture and body mechanics in your everyday activities. Sitting  When sitting, keep your spine neutral and keep your feet flat on the floor. Use a footrest, if needed, and keep your thighs parallel to  the floor. Avoid rounding your shoulders. Avoid tilting your head forward. When working at a desk or a computer, keep your desk at a height where your hands are slightly lower than your elbows. Slide your chair under your desk so you are close enough to maintain good posture. When working at a computer, place your monitor at a height where you are looking straight ahead and you do not have to tilt your head forward or downward to look at the screen. Standing  When standing, keep your spine neutral and keep your feet about hip-width apart. Keep a slight bend in your knees. Your ears, shoulders, and hips should line up. When you do a task in which you stand in one place for a long time, place one foot up on a stable object that is 2-4 inches (5-10 cm) high, such as a footstool. This helps keep your spine neutral. Resting When lying down and resting, avoid positions that are most painful for you. Try to support your neck  in a neutral position. You can use a contour pillow or a small rolled-up towel. Your pillow should support your neck but not push on it. This information is not intended to replace advice given to you by your health care provider. Make sure you discuss any questions you have with your health care provider. Document Revised: 12/18/2021 Document Reviewed: 12/18/2021 Else[employer] Patient Education  [place]. Cervical Sprain A cervical sprain is a stretch or tear in one or more of the ligaments in the neck. Ligaments are the tissues that connect bones. Cervical sprains can range from mild to severe. Severe cervical sprains can cause the spinal bones (vertebrae) in the neck to be unstable. This can result in spinal cord damage and in serious nervous system problems. The time that it takes for a cervical sprain to heal depends on the cause and extent of the injury. Most cervical sprains heal in 4-6 weeks. What are the causes? Cervical sprains may be caused by trauma, such as an injury from a motor vehicle accident, a fall, or a sudden forward and backward whipping movement of the head and neck (whiplash injury). Mild cervical sprains may be caused by wear and tear over time. What increases the risk? The following factors may make you more likely to develop this condition: Participating in activities that have a high risk of trauma to the neck. These include contact sports, auto racing, gymnastics, and diving. Taking risks when driving or riding in a motor vehicle. Osteoarthritis of the spine. Poor strength and flexibility of the neck. A previous neck injury. Poor posture. Spending long periods in certain positions that put stress on the neck, such as sitting at a computer for a long time. What are the signs or symptoms? Symptoms of this condition include: Pain, soreness, stiffness, tenderness, swelling, or a burning sensation in the front, back, or sides of the neck, shoulders, or upper  back. Sudden tightening of neck muscles (spasms). Limited ability to move the neck. Headache. Dizziness. Nausea or vomiting. Weakness, numbness, or tingling in a hand or an arm. Symptoms may develop right away after injury, or they may develop over a few days. In some cases, symptoms may go away with treatment and return (recur) over time. How is this diagnosed? This condition may be diagnosed based on: Your medical history. Your symptoms. Any recent injuries or known neck problems that you have, such as arthritis in the neck. A physical exam. Imaging tests, such as X-rays, MRI, and  CT scan. How is this treated? This condition is treated by resting and icing the injured area and doing physical therapy exercises. Heat therapy may be used 2-3 days after the injury occurred if there is no swelling. Depending on the severity of your condition, treatment may also include: Keeping your neck in place (immobilized) for periods of time. This may be done using: A cervical collar. This supports your chin and the back of your head. A cervical traction device. This is a sling that holds up your head. The device removes weight and pressure from your neck, and it may help to relieve pain. Medicines that help to relieve pain and inflammation. Medicines that help to relax your muscles (muscle relaxants). Surgery. This is rare. Follow these instructions at home: Medicines  Take over-the-counter and prescription medicines only as told by your health care provider. Ask your health care provider if the medicine prescribed to you: Requires you to avoid driving or using heavy machinery. Can cause constipation. You may need to take these actions to prevent or treat constipation: Drink enough fluid to keep your urine pale yellow. Take over-the-counter or prescription medicines. Eat foods that are high in fiber, such as beans, whole grains, and fresh fruits and vegetables. Limit foods that are high in fat and  processed sugars, such as fried or sweet foods. If you have a cervical collar: Wear the collar as told by your health care provider. Do not remove it unless told. Ask before making any adjustments to your collar. If you have long hair, keep it outside of the collar. Ask your health care provider if you may remove the collar for cleaning and bathing. If so: Follow instructions about how to remove it safely. Clean it by hand with mild soap and water and air-dry it completely. If your collar has removable pads, remove them every 1-2 days and wash them by hand with soap and water. Let them air-dry completely before putting them back in the collar. Tell your health care provider if your skin under the collar has irritation or sores. Managing pain, stiffness, and swelling     If directed, use a cervical traction device as told. If directed, put ice on the affected area. To do this: Put ice in a plastic bag. Place a towel between your skin and the bag. Leave the ice on for 20 minutes, 2-3 times a day. If directed, apply heat to the affected area before you do your physical therapy or as often as told by your health care provider. Use the heat source that your health care provider recommends, such as a moist heat pack or a heating pad. Place a towel between your skin and the heat source. Leave the heat on for 20-30 minutes. Remove the heat if your skin turns bright red. This is especially important if you are unable to feel pain, heat, or cold. You may have a greater risk of getting burned. Activity Do not drive while wearing a cervical collar. If you do not have a cervical collar, ask if it is safe to drive while your neck heals. Do not lift anything that is heavier than 10 lb (4.5 kg), or the limit that you are told, until your health care provider says that it is safe. Rest as told by your health care provider. If physical therapy was prescribed, do exercises as told by your health care  provider or physical therapist. Return to your normal activities as told by your health care provider. Avoid positions  and activities that make your symptoms worse. Ask your health care provider what activities are safe for you. General instructions Do not use any products that contain nicotine or tobacco, such as cigarettes, e-cigarettes, and chewing tobacco. These can delay healing. If you need help quitting, ask your health care provider. Keep all follow-up visits as told by your health care provider or physical therapist. This is important. How is this prevented? To prevent a cervical sprain from happening again: Use and maintain good posture. Make any needed adjustments to your workstation to help you do this. Exercise regularly as told by your health care provider or physical therapist. Avoid risky activities that may cause a cervical sprain. Contact a health care provider if you have: Symptoms that get worse or do not get better after 2 weeks of treatment. Pain that gets worse or does not get better with medicine. New, unexplained symptoms. Sores or irritated skin on your neck from wearing your cervical collar. Get help right away if: You have severe pain. You develop numbness, tingling, or weakness in any part of your body. You cannot move a part of your body (you have paralysis). You have neck pain along with severe dizziness or headache. Summary A cervical sprain is a stretch or tear in one or more of the ligaments in the neck. Cervical sprains may be caused by trauma, such as an injury from a motor vehicle accident, a fall, or a sudden forward and backward whipping movement of the head and neck (whiplash injury). Symptoms may develop right away after injury, or they may develop over a few days. This condition may be treated with rest, ice, heat, medicines, physical therapy, and surgery. This information is not intended to replace advice given to you by your health care provider.  Make sure you discuss any questions you have with your health care provider. Document Revised: 09/04/2021 Document Reviewed: 02/04/2019 Else[employer] Patient Education  [place].
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
## 2022-05-12 NOTE — Telephone Encounter (Signed)
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  Skin warm dry and pink A&Ox3 respirations even and unlabored RA

## 2022-05-14 ENCOUNTER — Telehealth: Payer: Self-pay | Admitting: Registered Nurse

## 2022-05-14 DIAGNOSIS — U071 COVID-19: Secondary | ICD-10-CM

## 2022-05-14 MED ORDER — MOLNUPIRAVIR EUA 200MG CAPSULE: 4.0000 | ORAL_CAPSULE | Freq: Two times a day (BID) | ORAL | 0 refills | Status: AC

## 2022-05-14 MED ORDER — FLUTICASONE PROPIONATE 50 MCG/ACT NA SUSP: 1.0000 | Freq: Two times a day (BID) | NASAL | 0 refills | Status: DC

## 2022-05-14 MED ORDER — SALINE SPRAY 0.65 % NA SOLN: 2.0000 | NASAL | 0 refills | Status: DC

## 2022-05-14 NOTE — Telephone Encounter (Unsigned)
Patient contacted NP positive home covid test yesterday 05/13/22 Day 0 fatigue, cough and congestion/head cold stayed home today and notified her supervisor Kara [name].  Pt began quarantine at that time. Patient did not develop symptoms of  trouble breathing, chest pain, nausea, vomiting, or diarrhea.   5 day quarantine per [facility] recommendations. Day 1 of quarantine 05/14/2022. Patient to contact clinic staff if vomiting after coughing or unable to tolerate po fluids.  Discussed flu and other viral illnesses circulating in community and some causing GI upset.  If GI upset I have recommended clear fluids then bland diet.  Avoid dairy/spicy, fried and large portions of meat while having nausea.  If vomiting hold po intake x 1 hour.  Then sips clear fluids like broths, ginger ale, power ade, gatorade, pedialyte may advance to soft/bland if no vomiting x 24 hours and appetite returned otherwise hydration main focus. Call me at work from home number if symptoms not improved with plan of care  patient to call if high fever, dehydration, marked weakness, fainting, increased abdominal pain, blood in stool or vomit (red or black).     Reviewed possible Covid symptoms including cough, shortness of breath with exertion or at rest, runny nose, congestion, sinus pain/pressure, sore throat, fever/chills, body aches, fatigue, loss of taste/smell, GI symptoms of nausea/vomiting/diarrhea. Also reviewed same day/emergent eval/ER precautions of dizziness/syncope, confusion, blue tint to lips/face, severe shortness of breath/difficulty breathing/wheezing.   Patient does not have sp02 monitor at home.   Patient to isolate in own room and if possible use only one bathroom if living with others in home.  Wear mask when out of room to help prevent spread to others in household.  Sanitize high touch surfaces with lysol/chlorox/bleach spray or wipes daily as viruses are known to live on surfaces from 24 hours to days.  Patient lives  alone.  Patient does not want antivirals but if worsening she would reconsider.  Last used molnupiravir Jun 2022.  This variant seems less severe than that infection.  Patient at higher risk for hospitalization due to MVP, hyperlipidemia, hypertension, prediabetes.  Patient is not up to date on covid vaccines.  Recommend annual booster 60 days after infection resolution.  Patient is on prescription medications or daily medications. If taking medications lexicomp medication interaction checker used to verify if any drug interactions. Taking OTC cough/cold/fever medication at this time dayquil/honey.  Patient molnupiravir FDA emergency use handout sent to patient electronically along with covid quarantine exitcare handout both in my chart and to personal email.  Discussed how to take molnupiravir 200mg  sig 4 tablets po am/pm x 5 days or molnupiravir 400mg  take 4 tabs by mouth every 12 hours x 5 days #40 RF0 sent to her pharmacy of choice and verified medication in stock.  If her symptoms worsening she can pick up from her pharmacy prior to Friday this week. Discussed lemonade can sometimes help with metallic/plastic taste in mouth (side effect medication).  Discussed most common side effects GI upset and bad taste in mouth.  Use birth control/avoid getting pregnant while on paxlovid and no breastfeeding.  Discussed I recommended not having sex with anyone while sick/testing positive/10 day quarantine as could spread virus to partner.  Discussed with patient RN would call again this week to follow up symptoms/see if questions/concerns.  Exitcare handouts on covid quarantine/home care sent to patient my chart/email.   Patient has  dayquil at home for cough/cold use and is working for her.  May use salt  water gargles and nasal saline 2 sprays each nostril q2h prn congestion/sore throat.  Research has shown it helps to prevent hospitalizations and decrease discomfort.   May use flonase nasal 1 spray each  nostril BID prn rhinitis.  Dayquil and nyquil per manufacturer instructions.  Patient has not needed tessalon pearles or albuterol inhaler in the past.     Discussed honey 1 tablespoon every 4 hours is a natural cough suppressant but caution due to his diabetes.  Avoid dehydration and drink water to keep urine pale yellow clear and voiding every 2-4 hours while awake.  Patient alert and oriented x3, spoke full sentences without difficulty.  Some nasal congestion audible during 7 minute telephone call.  No cough/throat clearing/hoarse voice/wheezing/shortness of breath.  Discussed with patient can contact NP Kara [name] through my [phone] when clinic closed if questions or concerns.  RN Kara [name] in clinic Monday- Thursday [phone].   Pt verbalized understanding and agreement with plan of care. No further questions/concerns at this time. Pt reminded to contact clinic with any changes in symptoms or questions/concerns. HR notified patient to work remote/quarantine through Day 5 RTW estimated Day 8 with strict mask wear through Day 10 and no eating in employee lunch room.  Estimated return to work onsite 05/21/22 as patient only works M-F  [employer] group notified of excused absence.

## 2022-05-17 NOTE — Telephone Encounter (Signed)
Spoke with mother and boyfriend (coworkers and onsite at work) patient doing better today 05/15/22 denied concerns.  Contacted patient via telephone stated started molnupiravir later the day as she started to feel worse but not awful but since worsening decided to start antiviral I sent in Rx boyfriend picked up Rx for her after work.  Feeling better today still quarantining with her dog solo.  Asked if harmful if she spends time with boyfriend who tested positive for covid last night.  Discussed with her he probably got it from her since symptoms starting in short timeframe  after her therefore same variant.  She had read not good for 2 covid positive to quarantine together as could prolong recovery.  Discussed with patient if different variant/reinfection that could be true or if one person shedding and immunosuppressed other patient.  Reading and watching programs/resting/tolerating po intake without difficulty/denied concerns.  Plans to spend time with boyfriend today and check on him now as he is along with his dog.  Spoke full sentences without difficulty no audible cough/wheezing/throat clearing/nasal congestion during 10 minute call.  Symptoms improved after starting antiviral.  Her dependent son supposed to come over to her house 10 Dec.  Discussed retest covid to see if still shedding virus.  Disinfect high touch surfaces e.g. countertops/handles fridge/microwave/doors/water faucet handles etc daily with bleach/clorox/lysol wipes spray and consider increasing airlow in home with hvac/opening windows.  If she is still testing positive quickly to consider discussing with ex spouse other childcare arrangements until she is testing negative. Expected RTW 11 Dec and mask wear through 13 Dec Day 10 and no eating in employee lunchroom discussed.   Patient verbalized understanding information/instructions, agreed with plan of care and had no further questions at that time.

## 2022-05-18 NOTE — Telephone Encounter (Signed)
Spoke with patient via telephone feels well denied concerns plans to return to work next week as previously discussed continuing quarantine with boyfriend who is also positive for covid.  A&Ox3 spoke full sentences without difficulty respirations even and unlabored RA no cough/congestion/throat clearing during 1 minute call.

## 2022-06-02 NOTE — Telephone Encounter (Signed)
Patient feeling well denied concerns spoke full sentences without difficulty skin warm dry and pink gait sure and steady respirations even and unlabored in workcenter encounter closed

## 2022-06-22 ENCOUNTER — Encounter: Payer: Self-pay | Admitting: Nurse Practitioner

## 2022-06-26 ENCOUNTER — Other Ambulatory Visit: Payer: Self-pay | Admitting: Occupational Medicine

## 2022-06-26 DIAGNOSIS — E559 Vitamin D deficiency, unspecified: Secondary | ICD-10-CM

## 2022-06-26 DIAGNOSIS — R7309 Other abnormal glucose: Secondary | ICD-10-CM

## 2022-06-26 DIAGNOSIS — D509 Iron deficiency anemia, unspecified: Secondary | ICD-10-CM

## 2022-06-26 NOTE — Progress Notes (Signed)
Lab drawn from Left AC tolerated well no issues noted.   

## 2022-06-27 ENCOUNTER — Ambulatory Visit: Payer: Self-pay | Admitting: Occupational Medicine

## 2022-06-27 ENCOUNTER — Encounter: Payer: Self-pay | Admitting: Registered Nurse

## 2022-06-27 ENCOUNTER — Other Ambulatory Visit: Payer: Self-pay | Admitting: Registered Nurse

## 2022-06-27 DIAGNOSIS — D509 Iron deficiency anemia, unspecified: Secondary | ICD-10-CM

## 2022-06-27 LAB — HEMOGLOBIN A1C
Est. average glucose Bld gHb Est-mCnc: 114 mg/dL
Hgb A1c MFr Bld: 5.6 % (ref 4.8–5.6)

## 2022-06-27 LAB — IRON: Iron: 17 ug/dL — ABNORMAL LOW (ref 27–159)

## 2022-06-27 LAB — HM MAMMOGRAPHY

## 2022-06-27 LAB — VITAMIN D 25 HYDROXY (VIT D DEFICIENCY, FRACTURES): Vit D, 25-Hydroxy: 40.8 ng/mL (ref 30.0–100.0)

## 2022-06-27 MED ORDER — IRON (FERROUS SULFATE) 325 (65 FE) MG PO TABS: 325.0000 mg | ORAL_TABLET | Freq: Every day | ORAL | 2 refills | Status: DC

## 2022-06-27 NOTE — Progress Notes (Signed)
Your vitamin D and blood sugar levels are now normal.  Your iron level has dropped further. Kara [name] sent in a prescription for iron supplement since multivitamin doesn't seen to be sufficient.  Reminder to take iron with citrus juice for better absorption and not milk/dairy products.  Retest iron level in 3 months. Scheduled for April 17 th. Please let me know if you have further questions or concerns.

## 2022-06-28 ENCOUNTER — Ambulatory Visit: Payer: No Typology Code available for payment source | Admitting: Registered Nurse

## 2022-07-11 ENCOUNTER — Ambulatory Visit: Payer: No Typology Code available for payment source | Admitting: Nurse Practitioner

## 2022-07-11 ENCOUNTER — Encounter: Payer: Self-pay | Admitting: Nurse Practitioner

## 2022-07-11 VITALS — BP 128/68 | HR 76 | Ht 63.0 in | Wt 121.6 lb

## 2022-07-11 DIAGNOSIS — Z1211 Encounter for screening for malignant neoplasm of colon: Secondary | ICD-10-CM

## 2022-07-11 DIAGNOSIS — D509 Iron deficiency anemia, unspecified: Secondary | ICD-10-CM | POA: Diagnosis not present

## 2022-07-11 MED ORDER — NA SULFATE-K SULFATE-MG SULF 17.5-3.13-1.6 GM/177ML PO SOLN: 1.0000 | Freq: Once | ORAL | 0 refills | Status: AC

## 2022-07-11 NOTE — Patient Instructions (Addendum)
_______________________________________________________  If your blood pressure at your visit was 140/90 or greater, please contact your primary care physician to follow up on this.  _______________________________________________________  If you are age 52 or older, your body mass index should be between 23-30. Your Body mass index is 21.54 kg/m. If this is out of the aforementioned range listed, please consider follow up with your Primary Care Provider.  If you are age 50 or younger, your body mass index should be between 19-25. Your Body mass index is 21.54 kg/m. If this is out of the aformentioned range listed, please consider follow up with your Primary Care Provider.   ________________________________________________________  The [facility] providers would like to encourage you to use [facility] to communicate with providers for non-urgent requests or questions.  Due to long hold times on the telephone, sending your provider a message by [facility] may be a faster and more efficient way to get a response.  Please allow 48 business hours for a response.  Please remember that this is for non-urgent requests.  _______________________________________________________  Dennis Bast have been scheduled for a colonoscopy. Please follow written instructions given to you at your visit today.  Please pick up your prep supplies at the pharmacy within the next 1-3 days. If you use inhalers (even only as needed), please bring them with you on the day of your procedure.  Your provider has requested that you go to the basement level for lab work before leaving today. Press "B" on the elevator. The lab is located at the first door on the left as you exit the elevator.   Due to recent changes in healthcare laws, you may see the results of your imaging and laboratory studies on MyChart before your provider has had a chance to review them.  We understand that in some cases there may be results that are confusing or  concerning to you. Not all laboratory results come back in the same time frame and the provider may be waiting for multiple results in order to interpret others.  Please give [place] 48 hours in order for your provider to thoroughly review all the results before contacting the office for clarification of your results.   It was a pleasure to see you today!  Thank you for trusting me with your gastrointestinal care!

## 2022-07-11 NOTE — Progress Notes (Signed)
Assessment    Patient profile:  Kara [name] is a 52 y.o. year old female , new to the practice with a past medical history of iron deficiency anemia, LEEP, breast implants, depression.  See PMH / [place] [facility] for additional history. Patient is referred by PCP for colon cancer screening.   # 52 year old female for colon cancer screening.  Maternal uncle had colon cancer in his 88s.  Paternal grandmother had colon cancer but it was in her 60s.  Patient has no overt GI bleeding, abdominal pain, bowel changes or other alarm symptoms.  She does have chronic iron deficiency anemia secondary to heavy menses, see below  # Chronic iron deficiency anemia in setting of heavy menses .Iron deficient dates back to at least 2021 but she only recently started iron supplements.  No overt gi bleeding. No GI symptoms such as abdominal pain, weight loss, etc.   Plan:    Schedule for a screening colonoscopy. The risks and benefits of colonoscopy with possible polypectomy / biopsies were discussed and the patient agrees to proceed.  Reasonable to check celiac serologies though iron defiency anemia sounds most certainly related to heavy menses   HPI:    Chief Complaint: None. Needs screening colonoscopy   Kara [name] is here for colon cancer screening.  This will be her first screening colonoscopy.  A maternal uncle was diagnosed with colon cancer in his 51's. There is a question of whether paternal grandmother also had colon cancer but would have been in her 50's.   Kara [name] has no alarm symptoms. Bowel movements normal. No blood in stool. She has chronic anemia with hgb in 10 range since 2019. She has heavy menstrual bleeding. She doesn't donate blood.  No abdominal pain or N/V. No weight loss .  Previous Labs / Imaging::    Latest Ref Rng & Units 09/14/2021   10:15 AM 07/21/2020    8:33 AM 07/24/2019    8:19 AM  CBC  WBC 3.4 - 10.8 x10E3/uL 5.5  5.0  5.4   Hemoglobin 11.1 - 15.9 g/dL 10.3  11.0  11.3   Hematocrit  34.0 - 46.6 % 32.1  34.1  34.8   Platelets 150 - 450 x10E3/uL 340  342.0  326.0     No results found for: "LIPASE"    Latest Ref Rng & Units 09/14/2021   10:15 AM 07/21/2020    8:33 AM 07/24/2019    8:19 AM  CMP  Glucose 70 - 99 mg/dL 95  90  86   BUN 6 - 24 mg/dL 12  15  19    Creatinine 0.57 - 1.00 mg/dL 0.84  0.80  0.82   Sodium 134 - 144 mmol/L 144  140  139   Potassium 3.5 - 5.2 mmol/L 4.4  4.5  4.7   Chloride 96 - 106 mmol/L 106  105  104   CO2 19 - 32 mEq/L  28  30   Calcium 8.7 - 10.2 mg/dL 8.6  8.8  9.0   Total Protein 6.0 - 8.5 g/dL 7.0  7.0  6.7   Total Bilirubin 0.0 - 1.2 mg/dL <0.2  0.3  0.5   Alkaline Phos 44 - 121 IU/L 47  40  41   AST 0 - 40 IU/L 17  15  17    ALT 0 - 32 IU/L 8  9  13      Past Medical History:  Diagnosis Date   Depression    Mitral valve prolapse  Past Surgical History:  Procedure Laterality Date   BREAST CYST ASPIRATION Right 11/02/2020   Family History  Problem Relation Age of Onset   Prostate cancer Father    Breast cancer Paternal Grandmother    Breast cancer Maternal Aunt    Cancer Maternal Aunt 55       breast   Esophageal cancer Neg Hx    Liver disease Neg Hx    Social History   Tobacco Use   Smoking status: Never   Smokeless tobacco: Never  Substance Use Topics   Alcohol use: Yes    Alcohol/week: 0.0 standard drinks of alcohol    Comment: occasional   Drug use: Never   Current Outpatient Medications  Medication Sig Dispense Refill   cetirizine (ZYRTEC) 10 MG tablet Take 1 tablet (10 mg total) by mouth daily. Take by mouth daily as needed 30 tablet 11   ibuprofen (ADVIL) 800 MG tablet Take 1 tablet (800 mg total) by mouth every 8 (eight) hours as needed. Take with food to prevent GI upset 21 tablet 0   fluticasone (FLONASE) 50 MCG/ACT nasal spray Place 1 spray into both nostrils 2 (two) times daily. (Patient not taking: Reported on 07/11/2022)  0   Iron, Ferrous Sulfate, 325 (65 Fe) MG TABS Take 325 mg by mouth daily.  (Patient not taking: Reported on 07/11/2022) 30 tablet 2   levonorgestrel-ethinyl estradiol (SRONYX) 0.1-20 MG-MCG tablet Take 1 tablet by mouth daily. (Patient not taking: Reported on 07/11/2022) 84 tablet 3   Pseudoephedrine-APAP-DM (DAYQUIL PO) Take 1 tablet by mouth as needed. (Patient not taking: Reported on 07/11/2022)     sertraline (ZOLOFT) 100 MG tablet TAKE 1 TABLET BY MOUTH EVERY DAY (Patient not taking: Reported on 07/11/2022) 90 tablet 2   sodium chloride (OCEAN) 0.65 % SOLN nasal spray Place 2 sprays into both nostrils every 2 (two) hours while awake.  0   tiZANidine (ZANAFLEX) 4 MG tablet Take 1 tablet (4 mg total) by mouth at bedtime as needed for muscle spasms. Do not take with alcohol or while driving or operating heavy machinery.  May cause drowsiness (Patient not taking: Reported on 07/11/2022) 30 tablet 0   No current facility-administered medications for this visit.   Allergies  Allergen Reactions   Sulfa Antibiotics Shortness Of Breath     Review of Systems: Positive for anxiety, fatigue, headaches, heart murmur, itching, night sweats, skin rash.  All other systems reviewed and negative except where noted in HPI.   Wt Readings from Last 3 Encounters:  07/11/22 121 lb 9.6 oz (55.2 kg)  04/19/22 116 lb (52.6 kg)  10/11/21 116 lb 4 oz (52.7 kg)    Physical Exam   BP 128/68   Pulse 76   Ht 5\' 3"  (1.6 m)   Wt 121 lb 9.6 oz (55.2 kg)   SpO2 97%   BMI 21.54 kg/m  Constitutional:  Generally well appearing female in no acute distress. Psychiatric: Pleasant. Normal mood and affect. Behavior is normal. EENT: Pupils normal.  Conjunctivae are normal. No scleral icterus. Neck supple.  Cardiovascular: Normal rate, regular rhythm.  Pulmonary/chest: Effort normal and breath sounds normal. No wheezing, rales or rhonchi. Abdominal: Soft, nondistended, nontender. Bowel sounds active throughout. There are no masses palpable. No hepatomegaly. Neurological: Alert and oriented to  person place and time. Skin: Skin is warm and dry. No rashes noted.  Tye Savoy, NP  07/11/2022, 8:43 AM  Cc:  Referring Provider Tower, Wynelle Fanny, MD

## 2022-07-11 NOTE — Progress Notes (Signed)
Reviewed and agree with management plans. ? ?Jermiyah Ricotta L. Daeja Helderman, MD, MPH  ?

## 2022-08-10 ENCOUNTER — Encounter: Payer: Self-pay | Admitting: Gastroenterology

## 2022-08-19 ENCOUNTER — Encounter: Payer: Self-pay | Admitting: Certified Registered Nurse Anesthetist

## 2022-08-20 ENCOUNTER — Encounter: Payer: Self-pay | Admitting: Gastroenterology

## 2022-08-20 ENCOUNTER — Ambulatory Visit: Payer: No Typology Code available for payment source | Admitting: Gastroenterology

## 2022-08-20 VITALS — BP 105/66 | HR 67 | Temp 98.0°F | Resp 13 | Ht 63.0 in | Wt 121.0 lb

## 2022-08-20 DIAGNOSIS — K635 Polyp of colon: Secondary | ICD-10-CM

## 2022-08-20 DIAGNOSIS — Z1211 Encounter for screening for malignant neoplasm of colon: Secondary | ICD-10-CM

## 2022-08-20 DIAGNOSIS — D123 Benign neoplasm of transverse colon: Secondary | ICD-10-CM

## 2022-08-20 MED ORDER — SODIUM CHLORIDE 0.9 % IV SOLN: 500.0000 mL | INTRAVENOUS | Status: DC

## 2022-08-20 NOTE — Progress Notes (Signed)
Report given to PACU, vss 

## 2022-08-20 NOTE — Patient Instructions (Signed)
Handout on polyps given to patient. Await pathology results. Resume previous diet and continue present medications.  Repeat colonoscopy for surveillance will be determined based off of pathology results.   YOU HAD AN ENDOSCOPIC PROCEDURE TODAY AT THE [facility]:   Refer to the procedure report that was given to you for any specific questions about what was found during the examination.  If the procedure report does not answer your questions, please call your gastroenterologist to clarify.  If you requested that your care partner not be given the details of your procedure findings, then the procedure report has been included in a sealed envelope for you to review at your convenience later.  YOU SHOULD EXPECT: Some feelings of bloating in the abdomen. Passage of more gas than usual.  Walking can help get rid of the air that was put into your GI tract during the procedure and reduce the bloating. If you had a lower endoscopy (such as a colonoscopy or flexible sigmoidoscopy) you may notice spotting of blood in your stool or on the toilet paper. If you underwent a bowel prep for your procedure, you may not have a normal bowel movement for a few days.  Please Note:  You might notice some irritation and congestion in your nose or some drainage.  This is from the oxygen used during your procedure.  There is no need for concern and it should clear up in a day or so.  SYMPTOMS TO REPORT IMMEDIATELY:  Following lower endoscopy (colonoscopy or flexible sigmoidoscopy):  Excessive amounts of blood in the stool  Significant tenderness or worsening of abdominal pains  Swelling of the abdomen that is new, acute  Fever of 100F or higher  For urgent or emergent issues, a gastroenterologist can be reached at any hour by calling [phone]. Do not use MyChart messaging for urgent concerns.    DIET:  We do recommend a small meal at first, but then you may proceed to your regular diet.  Drink  plenty of fluids but you should avoid alcoholic beverages for 24 hours.  ACTIVITY:  You should plan to take it easy for the rest of today and you should NOT DRIVE or use heavy machinery until tomorrow (because of the sedation medicines used during the test).    FOLLOW UP: Our staff will call the number listed on your records the next business day following your procedure.  We will call around 7:15- 8:00 am to check on you and address any questions or concerns that you may have regarding the information given to you following your procedure. If we do not reach you, we will leave a message.     If any biopsies were taken you will be contacted by phone or by letter within the next 1-3 weeks.  Please call us at [phone] if you have not heard about the biopsies in 3 weeks.    SIGNATURES/CONFIDENTIALITY: You and/or your care partner have signed paperwork which will be entered into your electronic medical record.  These signatures attest to the fact that that the information above on your After Visit Summary has been reviewed and is understood.  Full responsibility of the confidentiality of this discharge information lies with you and/or your care-partner. 

## 2022-08-20 NOTE — Op Note (Signed)
[facility] Patient Name: Kara [name] Procedure Date: 08/20/2022 8:55 AM MRN: [id number] Endoscopist: Thornton Park MD, MD, [id number] Age: 52 Referring MD:  Date of Birth: Mar 07, 1971 Gender: Female Account #: [id number] Procedure:                Colonoscopy Indications:              Screening for colorectal malignant neoplasm, This                            is the patient's first colonoscopy                           Maternal uncle had colon cancer in his 9s.                            Paternal grandmother had colon cancer in her 74s. Medicines:                Monitored Anesthesia Care Procedure:                Pre-Anesthesia Assessment:                           - Prior to the procedure, a History and Physical                            was performed, and patient medications and                            allergies were reviewed. The patient's tolerance of                            previous anesthesia was also reviewed. The risks                            and benefits of the procedure and the sedation                            options and risks were discussed with the patient.                            All questions were answered, and informed consent                            was obtained. Prior Anticoagulants: The patient has                            taken no anticoagulant or antiplatelet agents. ASA                            Grade Assessment: II - A patient with mild systemic                            disease. After reviewing the risks and benefits,  the patient was deemed in satisfactory condition to                            undergo the procedure.                           After obtaining informed consent, the colonoscope                            was passed under direct vision. Throughout the                            procedure, the patient's blood pressure, pulse, and                            oxygen saturations were  monitored continuously. The                            Olympus [id number]) Colonoscope was                            introduced through the anus and advanced to the 3                            cm into the ileum. A second forward view of the                            right colon was performed. The colonoscopy was                            performed without difficulty. The patient tolerated                            the procedure well. The quality of the bowel                            preparation was excellent. The terminal ileum,                            ileocecal valve, appendiceal orifice, and rectum                            were photographed. Scope In: 9:01:58 AM Scope Out: 9:15:29 AM Scope Withdrawal Time: 0 hours 10 minutes 7 seconds  Total Procedure Duration: 0 hours 13 minutes 31 seconds  Findings:                 The perianal and digital rectal examinations were                            normal.                           A 3 mm polyp was found in the hepatic flexure. The  polyp was sessile. The polyp was removed with a                            cold snare. Resection and retrieval were complete.                            Estimated blood loss was minimal.                           The exam was otherwise without abnormality on                            direct and retroflexion views. Complications:            No immediate complications. Estimated Blood Loss:     Estimated blood loss was minimal. Impression:               - One 3 mm polyp at the hepatic flexure, removed                            with a cold snare. Resected and retrieved.                           - The examination was otherwise normal on direct                            and retroflexion views. Recommendation:           - Patient has a contact number available for                            emergencies. The signs and symptoms of potential                             delayed complications were discussed with the                            patient. Return to normal activities tomorrow.                            Written discharge instructions were provided to the                            patient.                           - Resume previous diet.                           - Continue present medications.                           - Await pathology results.                           - Repeat colonoscopy for surveillance.                           -  Emerging evidence supports eating a diet of                            fruits, vegetables, grains, calcium, and yogurt                            while reducing red meat and alcohol may reduce the                            risk of colon cancer.                           - Thank you for allowing me to be involved in your                            colon cancer prevention. Thornton Park MD, MD 08/20/2022 9:20:36 AM This report has been signed electronically.

## 2022-08-20 NOTE — Progress Notes (Signed)
Referring Provider: Abner Greenspan, MD Primary Care Physician:  Tower, Wynelle Fanny, MD   Indication for Colonoscopy:  Colon cancer screening   IMPRESSION:   Need for colon cancer screening Appropriate candidate for monitored anesthesia care  PLAN: EGD Colonoscopy in the [facility] today   HPI: Kara [name] is a 52 y.o. female presents for screening colonoscopy.  No prior colonoscopy or colon cancer screening.  Maternal uncle had colon cancer in his 32s. Paternal grandmother had colon cancer in her 29s. No other known family history of colon cancer or polyps. No family history of uterine/endometrial cancer, pancreatic cancer or gastric/stomach cancer.   Past Medical History:  Diagnosis Date   Depression    Mitral valve prolapse     Past Surgical History:  Procedure Laterality Date   BREAST CYST ASPIRATION Right 11/02/2020    Current Outpatient Medications  Medication Sig Dispense Refill   levonorgestrel-ethinyl estradiol (SRONYX) 0.1-20 MG-MCG tablet Take 1 tablet by mouth daily. 84 tablet 3   tiZANidine (ZANAFLEX) 4 MG tablet Take 1 tablet (4 mg total) by mouth at bedtime as needed for muscle spasms. Do not take with alcohol or while driving or operating heavy machinery.  May cause drowsiness 30 tablet 0   cetirizine (ZYRTEC) 10 MG tablet Take 1 tablet (10 mg total) by mouth daily. Take by mouth daily as needed 30 tablet 11   fluticasone (FLONASE) 50 MCG/ACT nasal spray Place 1 spray into both nostrils 2 (two) times daily. (Patient not taking: Reported on 07/11/2022)  0   ibuprofen (ADVIL) 800 MG tablet Take 1 tablet (800 mg total) by mouth every 8 (eight) hours as needed. Take with food to prevent GI upset 21 tablet 0   Iron, Ferrous Sulfate, 325 (65 Fe) MG TABS Take 325 mg by mouth daily. (Patient not taking: Reported on 07/11/2022) 30 tablet 2   Pseudoephedrine-APAP-DM (DAYQUIL PO) Take 1 tablet by mouth as needed. (Patient not taking: Reported on 07/11/2022)     sertraline  (ZOLOFT) 100 MG tablet TAKE 1 TABLET BY MOUTH EVERY DAY 90 tablet 2   sodium chloride (OCEAN) 0.65 % SOLN nasal spray Place 2 sprays into both nostrils every 2 (two) hours while awake. (Patient not taking: Reported on 08/20/2022)  0   Current Facility-Administered Medications  Medication Dose Route Frequency Provider Last Rate Last Admin   0.9 %  sodium chloride infusion  500 mL Intravenous Continuous Thornton Park, MD        Allergies as of 08/20/2022 - Review Complete 08/20/2022  Allergen Reaction Noted   Sulfa antibiotics Shortness Of Breath 04/09/2011    Family History  Problem Relation Age of Onset   Prostate cancer Father    Breast cancer Paternal Grandmother    Breast cancer Maternal Aunt    Cancer Maternal Aunt 55       breast   Esophageal cancer Neg Hx    Liver disease Neg Hx      Physical Exam: General:   Alert,  well-nourished, pleasant and cooperative in NAD Head:  Normocephalic and atraumatic. Eyes:  Sclera clear, no icterus.   Conjunctiva pink. Mouth:  No deformity or lesions.   Neck:  Supple; no masses or thyromegaly. Lungs:  Clear throughout to auscultation.   No wheezes. Heart:  Regular rate and rhythm; no murmurs. Abdomen:  Soft, non-tender, nondistended, normal bowel sounds, no rebound or guarding.  Msk:  Symmetrical. No boney deformities LAD: No inguinal or umbilical LAD Extremities:  No clubbing or edema.  Neurologic:  Alert and  oriented x4;  grossly nonfocal Skin:  No obvious rash or bruise. Psych:  Alert and cooperative. Normal mood and affect.     Studies/Results: No results found.    Kara [name] L. Tarri Glenn, MD, MPH 08/20/2022, 8:53 AM

## 2022-08-21 ENCOUNTER — Telehealth: Payer: Self-pay

## 2022-08-21 NOTE — Telephone Encounter (Signed)
Attempted follow up call; no answer; left VM. 

## 2022-08-30 ENCOUNTER — Encounter: Payer: Self-pay | Admitting: Gastroenterology

## 2022-09-03 ENCOUNTER — Encounter: Payer: Self-pay | Admitting: Registered Nurse

## 2022-09-03 ENCOUNTER — Telehealth: Payer: Self-pay | Admitting: Registered Nurse

## 2022-09-03 DIAGNOSIS — Z0000 Encounter for general adult medical examination without abnormal findings: Secondary | ICD-10-CM

## 2022-09-03 NOTE — Telephone Encounter (Signed)
Epic reviewed met requirements for Be Well 2025 Hgba1c less than 7 and BP less than 135/85 on 07/11/22 appt with GI  BP 128/68   Pulse 76   Ht 5\' 3"  (1.6 m)   Wt 121 lb 9.6 oz (55.2 kg)   SpO2 97%   BMI 21.54 kg/m   BSA 1.57 m      More Vitals  Patient due repeat iron/executive panel April 2024 ordered  RN [name] notified instructions/complete paperwork with patient.

## 2022-09-06 ENCOUNTER — Ambulatory Visit: Payer: Self-pay | Admitting: Registered Nurse

## 2022-09-06 ENCOUNTER — Other Ambulatory Visit: Payer: Self-pay | Admitting: Family Medicine

## 2022-09-06 ENCOUNTER — Encounter: Payer: Self-pay | Admitting: Registered Nurse

## 2022-09-06 VITALS — BP 130/78 | HR 81 | Temp 99.2°F | Resp 16

## 2022-09-06 DIAGNOSIS — L249 Irritant contact dermatitis, unspecified cause: Secondary | ICD-10-CM

## 2022-09-06 MED ORDER — PREDNISONE 10 MG PO TABS: ORAL_TABLET | ORAL | Status: AC

## 2022-09-06 MED ORDER — HYDROCORTISONE 1 % EX LOTN: 1.0000 | TOPICAL_LOTION | Freq: Two times a day (BID) | CUTANEOUS | 0 refills | Status: AC

## 2022-09-06 MED ORDER — DIPHENHYDRAMINE HCL 2 % EX GEL: 1.0000 | Freq: Two times a day (BID) | CUTANEOUS | Status: AC | PRN

## 2022-09-06 MED ORDER — AQUAPHOR EX OINT: TOPICAL_OINTMENT | CUTANEOUS | 0 refills | Status: DC | PRN

## 2022-09-06 NOTE — Patient Instructions (Signed)
Pruritus Pruritus is an itchy feeling on the skin. One of the most common causes is dry skin, but many different things can cause itching. Most cases of itching do not require medical attention. Sometimes itchy skin can turn into a rash or a secondary infection. Follow these instructions at home: Skin care  Do not use scented soaps, detergents, perfumes, and cosmetic products. Instead, use gentle, unscented versions of these items. Apply moisturizing creams to your skin frequently, at least twice daily. Apply immediately after bathing while skin is still wet. Take medicines or apply medicated creams only as told by your health care provider. This may include: Corticosteroid cream or topical calcineurin inhibitor. Anti-itch lotions containing urea, camphor, or menthol. Oral antihistamines. Do not take hot showers or baths, which can make itching worse. A short, cool shower may help with itching as long as you apply moisturizing lotion after the shower. Apply a cool, wet cloth (cool compress) to the affected areas. You may take lukewarm baths with one of the following: Epsom salts. You can get these at your local pharmacy or grocery store. Follow the instructions on the packaging. Baking soda. Pour a small amount into the bath as told by your health care provider. Colloidal oatmeal. You can get this at your local pharmacy or grocery store. Follow the instructions on the packaging. Do not scratch your skin. General instructions Avoid wearing tight clothes. Keep a journal to help find out what is causing your itching. Write down: What you eat and drink. What cosmetic products you use. What soaps or detergents you use. What you wear, including jewelry. Use a humidifier. This keeps the air moist, which helps to prevent dry skin. Be aware of any changes in your itchiness. Tell your health care provider about any changes. Contact a health care provider if: The itching does not go away after  several days. You notice redness, warmth, or drainage on the skin where you have scratched. You are unusually thirsty or urinating more than normal. Your skin tingles or feels numb. Your skin or the white parts of your eyes turn yellow (jaundice). You feel weak. You have any of the following: Night sweats. Tiredness (fatigue). Weight loss. Abdominal pain. Summary Pruritus is an itchy feeling on the skin. One of the most common causes is dry skin, but many different conditions and factors can cause itching. Apply moisturizing creams to your skin frequently, at least twice daily. Apply immediately after bathing while skin is still wet. Take medicines or apply medicated creams only as told by your health care provider. Do not take hot showers or baths. Do not use scented soaps, detergents, perfumes, or cosmetic products. Keep a journal to help find out what is causing your itching. This information is not intended to replace advice given to you by your health care provider. Make sure you discuss any questions you have with your health care provider. Document Revised: 07/05/2021 Document Reviewed: 07/05/2021 Elsevier Patient Education  [place] Dermatitis Dermatitis is redness, soreness, and swelling (inflammation) of the skin. Contact dermatitis is a reaction to certain substances that touch the skin. There are two types of this condition: Irritant contact dermatitis. This is the most common type. It happens when something irritates your skin, such as when your hands get dry from washing them too often with soap. You can get this type of reaction even if you have not been exposed to the irritant before. Allergic contact dermatitis. This type is caused by a substance that  you are allergic to, such as poison ivy. It occurs when you have been exposed to the substance (allergen) and form a sensitivity to it. In some cases, the reaction may start soon after your first exposure to  the allergen. In other cases, it may not start until you are exposed to the allergen again. It may then occur every time you are exposed to the allergen in the future. What are the causes? Irritant contact dermatitis is often caused by exposure to: Makeup. Soaps, detergents, and bleaches. Acids. Metal salts, such as nickel. Allergic contact dermatitis is often caused by exposure to: Poisonous plants. Chemicals. Jewelry. Latex. Medicines. Preservatives in products, such as clothes. What increases the risk? You are more likely to get this condition if you have: A job that exposes you to irritants or allergens. Certain medical conditions. These include asthma and eczema. What are the signs or symptoms? Symptoms of this condition may occur in any place on your body that has been touched by the irritant. Symptoms include: Dryness, flaking, or cracking. Redness. Itching. Pain or a burning feeling. Blisters. Drainage of small amounts of blood or clear fluid from skin cracks. With allergic contact dermatitis, there may also be swelling in areas such as the eyelids, mouth, or genitals. How is this diagnosed? This condition is diagnosed with a medical history and physical exam. A patch skin test may be done to help figure out the cause. If the condition is related to your job, you may need to see an expert in health problems in the workplace (occupational medicine specialist). How is this treated? This condition is treated by staying away from the cause of the reaction and protecting your skin from further contact. Treatment may also include: Steroid creams or ointments. Steroid medicines may need be taken by mouth (orally) in more severe cases. Antibiotics or medicines applied to the skin to kill bacteria (antibacterial ointments). These may be needed if a skin infection is present. Antihistamines. These may be taken orally or put on as a lotion to ease itching. A bandage  (dressing). Follow these instructions at home: Skin care Moisturize your skin as needed. Put cool, wet cloths (cool compresses) on the affected areas. Try applying baking soda paste to your skin. Stir water into baking soda until it has the consistency of a paste. Do not scratch your skin. Avoid friction to the affected area. Avoid the use of soaps, perfumes, and dyes. Check the affected areas every day for signs of infection. Check for: More redness, swelling, or pain. More fluid or blood. Warmth. Pus or a bad smell. Medicines Take or apply over-the-counter and prescription medicines only as told by your health care provider. If you were prescribed antibiotics, take or apply them as told by your health care provider. Do not stop using the antibiotic even if you start to feel better. Bathing Try taking a bath with: Epsom salts. Follow the instructions on the packaging. You can get these at your local pharmacy or grocery store. Baking soda. Pour a small amount into the bath as told by your health care provider. Colloidal oatmeal. Follow the instructions on the packaging. You can get this at your local pharmacy or grocery store. Bathe less often. This may mean bathing every other day. Bathe in lukewarm water. Avoid using hot water. Bandage care If you were given a dressing, change it as told by your health care provider. Wash your hands with soap and water for at least 20 seconds before and after you  change your dressing. If soap and water are not available, use hand sanitizer. General instructions Avoid the substance that caused your reaction. If you do not know what caused it, keep a journal to try to track what caused it. Write down: What you eat and drink. What cosmetics you use. What you wear in the affected area. This includes jewelry. Contact a health care provider if: Your condition does not get better with treatment. Your condition gets worse. You have any signs of  infection. You have a fever. You have new symptoms. Your bone or joint under the affected area becomes painful after the skin has healed. Get help right away if: You notice red streaks coming from the affected area. The affected area turns darker. You have trouble breathing. This information is not intended to replace advice given to you by your health care provider. Make sure you discuss any questions you have with your health care provider. Document Revised: 12/01/2021 Document Reviewed: 12/01/2021 Elsevier Patient Education  [place] Dermatitis Poison ivy dermatitis is irritation and swelling (inflammation) of the skin caused by chemicals in the leaves of the poison ivy plant. The skin reaction often involves redness, blisters, and extreme itching. What are the causes? This condition is caused by a chemical (urushiol) found in the sap of the poison ivy plant. This chemical is sticky and can easily spread to people, animals, and objects. You can get poison ivy dermatitis by: Having direct contact with a poison ivy plant. Touching animals, other people, or objects that have come in contact with poison ivy and have the chemical on them. What increases the risk? This condition is more likely to develop in people who: Are outdoors often in wooded or [place] areas. Go outdoors without wearing protective clothing, such as closed shoes, long pants, and a long-sleeved shirt. What are the signs or symptoms? Symptoms of this condition include: Redness of the skin. Extreme itching. A rash that often includes bumps and blisters. The rash usually appears 48 hours after exposure, if you have been exposed before. If this is the first time you have been exposed, the rash may not appear until a week after exposure. Swelling. This may occur if the reaction is more severe. Symptoms usually last for 1-2 weeks. However, the first time you develop this condition, symptoms may last 3-4  weeks. How is this diagnosed? This condition may be diagnosed based on your symptoms and a physical exam. Your health care provider may also ask you about any recent outdoor activity. How is this treated? Treatment for this condition will vary depending on how severe it is. Treatment may include: Hydrocortisone cream or calamine lotion to relieve itching. Oatmeal baths to soothe the skin. Medicines, such as over-the-counter antihistamine tablets. Oral or injected steroid medicine, for more severe reactions. Follow these instructions at home: Medicines Take or apply over-the-counter and prescription medicines only as told by your health care provider. Use hydrocortisone cream or calamine lotion as needed to soothe the skin and relieve itching. General instructions Do not scratch or rub your skin. Apply a cold, wet cloth (cold compress) to the affected areas or take baths in cool water. This will help with itching. Avoid hot baths and showers. Take oatmeal baths as needed. Use colloidal oatmeal. You can get this at your local pharmacy or grocery store. Follow the instructions on the packaging. Wash all clothes, bedsheets, towels, and blankets you were in contact with between your exposure and appearance of the rash.  Check the affected area every day for signs of infection. Check for: More redness, swelling, or pain. Fluid or blood. Warmth. Pus or a bad smell. Keep all follow-up visits. Your health care provider may want to see how your skin is progressing with treatment. How is this prevented?  Learn to identify the poison ivy plant and avoid contact with the plant. This plant can be recognized by the number of leaves. Generally, poison ivy has three leaves with flowering branches on a single stem. The leaves are typically glossy, and they have jagged edges that come to a point. If you have been exposed to poison ivy, thoroughly wash with soap and water right away. You have about 30 minutes  to remove the plant resin before it will cause the rash. Be sure to wash under your fingernails, because any plant resin there will continue to spread the rash. When hiking or camping, wear clothes that will help you to avoid skin exposure. This includes long pants, a long-sleeved shirt, long socks, and hiking boots. You can also apply preventive lotion to your skin to help limit exposure. If you suspect that your clothes or outdoor gear came in contact with poison ivy, rinse them off outside with a garden hose before you bring them inside your house. When doing yard work or gardening, wear gloves, long sleeves, long pants, and boots. Wash your garden tools and gloves if they come in contact with poison ivy. If you suspect that your pet has come into contact with poison ivy, wash them with pet shampoo and water. Make sure to wear gloves while washing your pet. Contact a health care provider if: You have open sores in the rash area. You have any signs of infection. You have redness that spreads beyond the rash area. You have a fever. You have a rash over a large area of your body. You have a rash on your eyes, mouth, or genitals. You have a rash that does not improve after a few weeks. Get help right away if: Your face swells or your eyes swell shut. You have trouble breathing. You have trouble swallowing. These symptoms may be an emergency. Get help right away. Call 911. Do not wait to see if the symptoms will go away. Do not drive yourself to the hospital. This information is not intended to replace advice given to you by your health care provider. Make sure you discuss any questions you have with your health care provider. Document Revised: 10/26/2021 Document Reviewed: 10/26/2021 Elsevier Patient Education  [place].

## 2022-09-06 NOTE — Progress Notes (Signed)
Subjective:    Patient ID: Kara [name], female    DOB: May 08, 1971, 52 y.o.   MRN: [id number]  52y/o caucasian female single established here for evaluation noted itchy rash left arm first then stomach and right arm.  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  Frequent contact with many different cleaning agents/soaps on clothes/jackets/shoes/childrens games and toys.  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  Did not see poison oak/ivy.  Has been taking benadryl prn but can only take at night makes her too sleepy at work.      Review of Systems  Constitutional:  Negative for chills, diaphoresis and fever.  HENT:  Negative for trouble swallowing and voice change.   Eyes:  Negative for photophobia and visual disturbance.  Respiratory:  Negative for cough, shortness of breath, wheezing and stridor.   Gastrointestinal:  Negative for diarrhea, nausea and vomiting.  Genitourinary:  Negative for difficulty urinating.  Musculoskeletal:  Negative for gait problem, myalgias, neck pain and neck stiffness.  Skin:  Positive for color change and rash. Negative for pallor and wound.  Neurological:  Negative for dizziness, tremors, seizures, syncope, facial asymmetry, speech difficulty, weakness, light-headedness, numbness and headaches.  Hematological:  Negative for adenopathy.  Psychiatric/Behavioral:  Negative for agitation, confusion and sleep disturbance.        Objective:   Physical Exam Vitals and nursing note reviewed.  Constitutional:      General: She is awake. She is not in acute distress.    Appearance: Normal appearance. She is well-developed, well-groomed and normal weight. She is not ill-appearing, toxic-appearing or diaphoretic.  HENT:     Head: Normocephalic and atraumatic.     Jaw: There is normal jaw occlusion.     Salivary Glands: Right salivary gland is not diffusely enlarged. Left salivary gland is not  diffusely enlarged.     Right Ear: Hearing and external ear normal.     Left Ear: Hearing and external ear normal.     Nose: Nose normal. No congestion or rhinorrhea.     Right Turbinates: Not enlarged, swollen or pale.     Left Turbinates: Not enlarged, swollen or pale.     Mouth/Throat:     Lips: Pink. No lesions.     Mouth: Mucous membranes are moist. No oral lesions or angioedema.     Dentition: No gum lesions.     Tongue: No lesions. Tongue does not deviate from midline.     Palate: No mass and lesions.     Pharynx: Oropharynx is clear. Uvula midline. No uvula swelling.  Eyes:     General: Lids are normal. Vision grossly intact. Gaze aligned appropriately. Allergic shiner present. No scleral icterus.       Right eye: No discharge.        Left eye: No discharge.     Extraocular Movements: Extraocular movements intact.     Conjunctiva/sclera: Conjunctivae normal.     Right eye: Right conjunctiva is not injected. No chemosis, exudate or hemorrhage.    Left eye: Left conjunctiva is not injected. No chemosis, exudate or hemorrhage.    Pupils: Pupils are equal, round, and reactive to light.  Neck:     Trachea: Trachea and phonation normal. No abnormal tracheal secretions or tracheal deviation.  Cardiovascular:     Rate and Rhythm: Normal rate and regular rhythm.     Pulses: Normal pulses.  Radial pulses are 2+ on the right side and 2+ on the left side.  Pulmonary:     Effort: Pulmonary effort is normal. No respiratory distress.     Breath sounds: Normal breath sounds and air entry. No stridor or transmitted upper airway sounds. No wheezing.     Comments: Spoke full sentences without difficulty; no cough observed in exam room Abdominal:     General: Abdomen is flat. There is no distension.     Hernia: No hernia is present.  Musculoskeletal:        General: No swelling or deformity. Normal range of motion.     Right upper arm: No swelling, edema, deformity or lacerations.      Left upper arm: No swelling, edema, deformity or lacerations.     Right elbow: No swelling, deformity, effusion or lacerations. Normal range of motion.     Left elbow: No swelling, deformity, effusion or lacerations. Normal range of motion.     Right forearm: No swelling, edema or lacerations.     Left forearm: No swelling, edema or lacerations.     Right hand: No swelling. Normal strength. Normal capillary refill.     Left hand: No swelling. Normal strength. Normal capillary refill.     Cervical back: Normal range of motion and neck supple. No swelling, edema, deformity, erythema, signs of trauma, lacerations, rigidity, torticollis or crepitus. No pain with movement. Normal range of motion.     Thoracic back: No swelling, edema, deformity, signs of trauma or lacerations. Normal range of motion.     Right lower leg: No edema.     Left lower leg: No edema.  Lymphadenopathy:     Head:     Right side of head: No submandibular or preauricular adenopathy.     Left side of head: No submandibular or preauricular adenopathy.     Cervical: No cervical adenopathy.     Right cervical: No superficial cervical adenopathy.    Left cervical: No superficial cervical adenopathy.  Skin:    General: Skin is warm and dry.     Capillary Refill: Capillary refill takes less than 2 seconds.     Coloration: Skin is not ashen, cyanotic, jaundiced, mottled, pale or sallow.     Findings: Erythema and rash present. No abrasion, abscess, acne, bruising, burn, ecchymosis, signs of injury, laceration, petechiae or wound. Rash is macular and papular. Rash is not crusting, nodular, purpuric, pustular, scaling, urticarial or vesicular.     Nails: There is no clubbing.       Neurological:     General: No focal deficit present.     Mental Status: She is alert and oriented to person, place, and time. Mental status is at baseline.     GCS: GCS eye subscore is 4. GCS verbal subscore is 5. GCS motor subscore is 6.      Cranial Nerves: Cranial nerves 2-12 are intact. No cranial nerve deficit, dysarthria or facial asymmetry.     Sensory: Sensation is intact.     Motor: Motor function is intact. No weakness, tremor, atrophy, abnormal muscle tone or seizure activity.     Coordination: Coordination is intact. Coordination normal.     Gait: Gait is intact. Gait normal.     Comments: In/out of chair and on/off exam table without difficulty; gait sure and steady in clinic; bilateral hand grasp equal 5/5  Psychiatric:        Attention and Perception: Attention and perception normal.  Mood and Affect: Mood and affect normal.        Speech: Speech normal.        Behavior: Behavior normal. Behavior is cooperative.        Thought Content: Thought content normal.        Cognition and Memory: Cognition and memory normal.        Judgment: Judgment normal.           Assessment & Plan:   A-irritant contact dermatitis unspecified trigger  P-Discussed could be mix of plants and chemicals or one cannot determine since was handling items from pallet and plants same day.  Benadryl 25mg  po qhs helping.  May trial topical diphenhydramine (calagel) QID prn itching rash.  Apply aquaphor over the top.  May alternate with hydrocortisone 1% BID prn itching.  Tolerating benadryl 25mg  po QID prn itching with drowsiness may continue or switch to zyrtec 10mg  po BID prn itching.  calagel thin smear QID prn itching given 8 UD from clinic stock; do not get in eyes; if worsening with calagel use stop and trial hydrocortisone 1% topical BID.  Wash hands before and after application.  Avoid hot steam showers.  Apply emollient twice a day e.g. Fragrance free vaseline/aquaphor.  May apply ice/cold compress 5 minutes QID prn itching/swelling. Discussed if spreading/worsening despite plan of care start prednisone 30mg  x 2 days, 20mg  x 2 days 10mg  x 5 days then 5mg  x 5 days #21 RF0 dispensed from [facility] to patient today Discussed with patient do  not stop prednisone if rash clears after 2 days use as typically will get rebound worsening.  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  Shower when she finishes work/prior to bedtime.  Avoid harsh/abrasive soaps use fragrance free/sensitive like dove/cetaphil.    Medication as directed. Call or return to clinic as needed if these symptoms worsen or fail to improve as anticipated. Exitcare handouts on contact dermatitis, poison ivy and pruritis  Follow up for re-evaluation in 48 hours if no improvement and/or worsening of rash with plan of care. Patient verbalized agreement and understanding of treatment plan and had no further questions at this time   Patient taking her iron supplement and scheduled follow up labs with RN [name].

## 2022-09-07 ENCOUNTER — Other Ambulatory Visit: Payer: Self-pay | Admitting: Registered Nurse

## 2022-09-07 DIAGNOSIS — D509 Iron deficiency anemia, unspecified: Secondary | ICD-10-CM

## 2022-09-07 NOTE — Telephone Encounter (Signed)
Patient is scheduled to have labs drawn in April.  Refilled iron for 90 days Rx sent to her pharmacy of choice no refills.  My chart message sent to patient.

## 2022-09-26 ENCOUNTER — Other Ambulatory Visit: Payer: Self-pay | Admitting: Occupational Medicine

## 2022-10-04 ENCOUNTER — Other Ambulatory Visit: Payer: Self-pay | Admitting: Occupational Medicine

## 2022-10-24 ENCOUNTER — Other Ambulatory Visit: Payer: Self-pay | Admitting: Occupational Medicine

## 2022-10-24 DIAGNOSIS — Z0000 Encounter for general adult medical examination without abnormal findings: Secondary | ICD-10-CM

## 2022-10-24 DIAGNOSIS — D509 Iron deficiency anemia, unspecified: Secondary | ICD-10-CM

## 2022-10-24 NOTE — Progress Notes (Signed)
Lab drawn from Left AC tolerated well no issues noted.   

## 2022-10-25 ENCOUNTER — Encounter: Payer: Self-pay | Admitting: Registered Nurse

## 2022-10-25 ENCOUNTER — Ambulatory Visit: Payer: Self-pay | Admitting: Occupational Medicine

## 2022-10-25 DIAGNOSIS — Z0000 Encounter for general adult medical examination without abnormal findings: Secondary | ICD-10-CM

## 2022-10-25 LAB — CMP12+LP+TP+TSH+6AC+CBC/D/PLT
ALT: 10 IU/L (ref 0–32)
AST: 17 IU/L (ref 0–40)
Albumin/Globulin Ratio: 1.9 (ref 1.2–2.2)
Albumin: 4.6 g/dL (ref 3.8–4.9)
Alkaline Phosphatase: 48 IU/L (ref 44–121)
BUN/Creatinine Ratio: 16 (ref 9–23)
BUN: 13 mg/dL (ref 6–24)
Basophils Absolute: 0 10*3/uL (ref 0.0–0.2)
Basos: 1 %
Bilirubin Total: 0.2 mg/dL (ref 0.0–1.2)
Calcium: 8.7 mg/dL (ref 8.7–10.2)
Chloride: 103 mmol/L (ref 96–106)
Chol/HDL Ratio: 3.2 ratio (ref 0.0–4.4)
Cholesterol, Total: 220 mg/dL — ABNORMAL HIGH (ref 100–199)
Creatinine, Ser: 0.83 mg/dL (ref 0.57–1.00)
EOS (ABSOLUTE): 0.1 10*3/uL (ref 0.0–0.4)
Eos: 2 %
Estimated CHD Risk: <0.5 times avg. (ref 0.0–1.0)
Free Thyroxine Index: 1.3 (ref 1.2–4.9)
GGT: 10 IU/L (ref 0–60)
Globulin, Total: 2.4 g/dL (ref 1.5–4.5)
Glucose: 80 mg/dL (ref 70–99)
HDL: 68 mg/dL (ref >39)
Hematocrit: 39.8 % (ref 34.0–46.6)
Hemoglobin: 13 g/dL (ref 11.1–15.9)
Immature Grans (Abs): 0 10*3/uL (ref 0.0–0.1)
Immature Granulocytes: 0 %
Iron: 96 ug/dL (ref 27–159)
LDH: 181 IU/L (ref 119–226)
LDL Chol Calc (NIH): 134 mg/dL — ABNORMAL HIGH (ref 0–99)
Lymphocytes Absolute: 1.3 10*3/uL (ref 0.7–3.1)
Lymphs: 22 %
MCH: 29.8 pg (ref 26.6–33.0)
MCHC: 32.7 g/dL (ref 31.5–35.7)
MCV: 91 fL (ref 79–97)
Monocytes Absolute: 0.2 10*3/uL (ref 0.1–0.9)
Monocytes: 4 %
Neutrophils Absolute: 4.4 10*3/uL (ref 1.4–7.0)
Neutrophils: 71 %
Phosphorus: 3.3 mg/dL (ref 3.0–4.3)
Platelets: 321 10*3/uL (ref 150–450)
Potassium: 4.3 mmol/L (ref 3.5–5.2)
RBC: 4.36 x10E6/uL (ref 3.77–5.28)
RDW: 14.5 % (ref 11.7–15.4)
Sodium: 139 mmol/L (ref 134–144)
T3 Uptake Ratio: 20 % — ABNORMAL LOW (ref 24–39)
T4, Total: 6.4 ug/dL (ref 4.5–12.0)
TSH: 1.61 u[IU]/mL (ref 0.450–4.500)
Total Protein: 7 g/dL (ref 6.0–8.5)
Triglycerides: 101 mg/dL (ref 0–149)
Uric Acid: 3 mg/dL (ref 3.0–7.2)
VLDL Cholesterol Cal: 18 mg/dL (ref 5–40)
WBC: 6.1 10*3/uL (ref 3.4–10.8)
eGFR: 85 mL/min/{1.73_m2} (ref >59)

## 2022-10-25 NOTE — Progress Notes (Signed)
[facility] insurance premium discount evaluation: Met   Patient completed PCM office visit. Epic reviewed by RN [name] transcribed labs and reviewed with the patient. Tobacco attestation signed. [facility] ROI formed signed. Forms placed in the chart.   Patient given handouts for [facility] pharmacies and discount drugs list, MyChart, [facility], [facility], [place] counseling and [employer] counseling.  What to do for infectious illness protocol. Given handout for list of medications that can be filled at [facility]. Given Clinic hours and Clinic Email.

## 2022-11-04 NOTE — Telephone Encounter (Signed)
Signed be well paperwork 10/25/22

## 2022-11-04 NOTE — Telephone Encounter (Signed)
Labs completed 10/24/22 see results note iron normal

## 2022-11-08 ENCOUNTER — Other Ambulatory Visit: Payer: Self-pay | Admitting: Family Medicine

## 2022-11-08 ENCOUNTER — Other Ambulatory Visit: Payer: Self-pay | Admitting: Nurse Practitioner

## 2022-11-09 ENCOUNTER — Telehealth: Payer: Self-pay | Admitting: Family Medicine

## 2022-11-09 MED ORDER — SERTRALINE HCL 100 MG PO TABS: 100.0000 mg | ORAL_TABLET | Freq: Every day | ORAL | 0 refills | Status: DC

## 2022-11-09 NOTE — Telephone Encounter (Signed)
Rx sent to [facility] for 90 days supply. Pt is due for CPE. Please help her get scheduled. Thanks

## 2022-11-09 NOTE — Telephone Encounter (Signed)
Unable to refill per protocol, last refill by another provider not at this practice.  Requested Prescriptions  Pending Prescriptions Disp Refills   tiZANidine (ZANAFLEX) 4 MG tablet [Pharmacy Med Name: TIZANIDINE HCL 4 MG TABLET] 30 tablet 0    Sig: TAKE 1 TABLET BY MOUTH AT BEDTIME AS NEEDED FOR MUSCLE SPASMS. 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 MAY CAUSE DROWSINESS     Not Delegated - Cardiovascular:  Alpha-2 [facility]on[facility] - tizanidine Failed - 11/08/2022  5:19 PM      Failed - This refill cannot be delegated      Failed - Valid encounter within last 6 months    Recent Outpatient Visits   None

## 2022-11-09 NOTE — Telephone Encounter (Signed)
Prescription Request  11/09/2022  LOV: 04/19/2022  What is the name of the medication or equipment? sertraline (ZOLOFT) 100 MG tablet   Have you contacted your pharmacy to request a refill? Yes   Which pharmacy would you like this sent to?  [id number]ann Sheen, [place] - [address] [place] [address] Phone: [phone] Fax: [phone]     Patient notified that their request is being sent to the clinical staff for review and that they should receive a response within 2 business days.   Please advise at Mobile [phone] (mobile)

## 2022-11-12 NOTE — Telephone Encounter (Signed)
Lvmtcb, sent mychart message  

## 2022-12-12 ENCOUNTER — Other Ambulatory Visit: Payer: Self-pay | Admitting: Registered Nurse

## 2022-12-12 DIAGNOSIS — Z862 Personal history of diseases of the blood and blood-forming organs and certain disorders involving the immune mechanism: Secondary | ICD-10-CM

## 2022-12-12 DIAGNOSIS — Z0000 Encounter for general adult medical examination without abnormal findings: Secondary | ICD-10-CM

## 2022-12-13 ENCOUNTER — Encounter: Payer: Self-pay | Admitting: Registered Nurse

## 2022-12-13 NOTE — Telephone Encounter (Signed)
Patient reported she is taking iron supplement daily since March 2024.  Iron level normal on labs and CBC normalized.  Had gyn appt Dec 2023.   Colon cancer screening Mar 2024.  One polyp removed. Electronic Rx sent to her pharmacy of choice refill ferrous sulfate 325mg  po daily #90 RF0  Recheck iron level and CBC May 2025 planned.  Patient verbalized understanding and had no further questions at this time.   Latest Reference Range & Units 10/24/22 08:55  Sodium 134 - 144 mmol/L 139  Potassium 3.5 - 5.2 mmol/L 4.3  Chloride 96 - 106 mmol/L 103  Glucose 70 - 99 mg/dL 80  BUN 6 - 24 mg/dL 13  Creatinine 1.61 - 0.96 mg/dL 0.45  Calcium 8.7 - 40.9 mg/dL 8.7  BUN/Creatinine Ratio 9 - 23  16  eGFR >59 mL/min/1.73 85  Phosphorus 3.0 - 4.3 mg/dL 3.3  Alkaline Phosphatase 44 - 121 IU/L 48  Albumin 3.8 - 4.9 g/dL 4.6  Albumin/Globulin Ratio 1.2 - 2.2  1.9  Uric Acid 3.0 - 7.2 mg/dL 3.0  AST 0 - 40 IU/L 17  ALT 0 - 32 IU/L 10  Total Protein 6.0 - 8.5 g/dL 7.0  Total Bilirubin 0.0 - 1.2 mg/dL 0.2  GGT 0 - 60 IU/L 10  Estimated CHD Risk 0.0 - 1.0 times avg.  < 0.5  LDH 119 - 226 IU/L 181  Total CHOL/HDL Ratio 0.0 - 4.4 ratio 3.2  Cholesterol, Total 100 - 199 mg/dL 811 (H)  HDL Cholesterol >39 mg/dL 68  Triglycerides 0 - 914 mg/dL 782  VLDL Cholesterol Cal 5 - 40 mg/dL 18  LDL Chol Calc (NIH) 0 - 99 mg/dL 956 (H)  Iron 27 - 213 ug/dL 96  (H): Data is abnormally high

## 2023-01-01 ENCOUNTER — Ambulatory Visit (INDEPENDENT_AMBULATORY_CARE_PROVIDER_SITE_OTHER): Payer: No Typology Code available for payment source | Admitting: Family Medicine

## 2023-01-01 ENCOUNTER — Encounter: Payer: Self-pay | Admitting: Family Medicine

## 2023-01-01 VITALS — BP 132/72 | HR 86 | Temp 98.0°F | Ht 62.0 in | Wt 114.2 lb

## 2023-01-01 DIAGNOSIS — E559 Vitamin D deficiency, unspecified: Secondary | ICD-10-CM

## 2023-01-01 DIAGNOSIS — R7303 Prediabetes: Secondary | ICD-10-CM

## 2023-01-01 DIAGNOSIS — B977 Papillomavirus as the cause of diseases classified elsewhere: Secondary | ICD-10-CM | POA: Diagnosis not present

## 2023-01-01 DIAGNOSIS — Z1211 Encounter for screening for malignant neoplasm of colon: Secondary | ICD-10-CM

## 2023-01-01 DIAGNOSIS — F329 Major depressive disorder, single episode, unspecified: Secondary | ICD-10-CM

## 2023-01-01 DIAGNOSIS — Z0000 Encounter for general adult medical examination without abnormal findings: Secondary | ICD-10-CM

## 2023-01-01 DIAGNOSIS — Z23 Encounter for immunization: Secondary | ICD-10-CM

## 2023-01-01 DIAGNOSIS — D509 Iron deficiency anemia, unspecified: Secondary | ICD-10-CM

## 2023-01-01 DIAGNOSIS — D069 Carcinoma in situ of cervix, unspecified: Secondary | ICD-10-CM

## 2023-01-01 DIAGNOSIS — E785 Hyperlipidemia, unspecified: Secondary | ICD-10-CM

## 2023-01-01 DIAGNOSIS — F3281 Premenstrual dysphoric disorder: Secondary | ICD-10-CM

## 2023-01-01 NOTE — Patient Instructions (Addendum)
[facility] your [facility] and get your 2nd   Tetanus shot today/ Tdap    Try to get 1200-1500 mg of calcium per day (diet or supplement)(  with at least 2000 iu of vitamin D - for bone health  Keep walking  Add some strength training to your routine, this is important for bone and brain health and can reduce your risk of falls and help your body use insulin properly and regulate weight  Light weights, exercise bands , and internet videos are a good way to start  Yoga (chair or regular), machines , floor exercises or a gym with machines are also good options     Take your iron based on periods    For cholesterol Avoid red meat/ fried foods/ egg yolks/ fatty breakfast meats/ butter, cheese and high fat dairy/ and shellfish    Get me a copy of work labs when you can

## 2023-01-01 NOTE — Assessment & Plan Note (Signed)
Sent for last pap report from gyn Per pt-has had her HPV vaccines

## 2023-01-01 NOTE — Assessment & Plan Note (Signed)
Lab Results  Component Value Date   HGBA1C 5.6 06/26/2022   disc imp of low glycemic diet and wt loss to prevent DM2

## 2023-01-01 NOTE — Assessment & Plan Note (Addendum)
Doing well  PHQ of 6 Improved  Continues sertraline 100 mg daily to help mood  Also helps perimenop mood change   Encouraged to keep up good self car e

## 2023-01-01 NOTE — Assessment & Plan Note (Addendum)
Reviewed health habits including diet and exercise and skin cancer prevention Reviewed appropriate screening tests for age  Also reviewed health mt list, fam hx and immunization status , as well as social and family history   See HPI Labs reviewed and ordered Sent for last pap report from gyn Continues OC/ is perimenopausal  Mammog utd 06/2022 Colonscopy utd 08/2022 Pt [facility] and [facility] her 2nd shot Tdap updated today Discussed bone health, vit D level is normal , encouraged strength training  PHQ of 6, improved

## 2023-01-01 NOTE — Assessment & Plan Note (Signed)
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

## 2023-01-01 NOTE — Assessment & Plan Note (Signed)
Colonoscopy utd 08/2022

## 2023-01-01 NOTE — Assessment & Plan Note (Signed)
Sent for last pap from gyn  Has been immunized

## 2023-01-01 NOTE — Progress Notes (Signed)
Subjective:    Patient ID: Kara [name], female    DOB: [birth year], 52 y.o.   MRN: [id number]  HPI  Here for health maintenance exam and to review chronic medical problems   Wt Readings from Last 3 Encounters:  01/01/23 114 lb 4 oz (51.8 kg)  08/20/22 121 lb (54.9 kg)  07/11/22 121 lb 9.6 oz (55.2 kg)   20.90 kg/m  Vitals:   01/01/23 1547  BP: 132/72  Pulse: 86  Temp: 98 F (36.7 C)  SpO2: 96%    Immunization History  Administered Date(s) Administered   HPV 9-valent 08/12/2018, 11/14/2018, 03/17/2019   Influenza Inj Mdck Quad Pf 03/24/2019   Influenza Split 04/07/2012   Influenza, Quadrivalent, Recombinant, Inj, Pf 04/23/2017   Influenza,inj,Quad PF,6+ Mos 08/03/2014, 04/20/2016, 02/12/2018, 03/14/2021   Influenza-Unspecified 03/24/2020   PFIZER(Purple Top)SARS-COV-2 Vaccination 09/07/2019, 09/30/2019, 04/09/2020   Tdap 07/15/2012, 01/01/2023    Health Maintenance Due  Topic Date Due   Zoster Vaccines- Shingrix (1 of 2) Never done   HEMOGLOBIN A1C  12/25/2022   Doing ok overall  Feels decent    Tetanus shot due -is around babies    Mammogram 06/2022 Self breast exam: no lumps   Gyn care/ pap -has a gyn provider Needs breast exam   Pap was 04/2018  Negative but HPV was positive  Had colp with CIN3  LEEP done 2020 -margins neg and ECC neg Repeat pap 2021 recommended  HPV vaccine planned - had that   Thinks pap was pretty recently   Taking OC 1/20 monophasic  Still has period  Some perimenopausal brain fog and irritability    Colon cancer screening -colonoscopy 08/2022  5 y recall   Bone health  Falls: one fall / no injury  Fractures: none  Supplements  Last vitamin D Lab Results  Component Value Date   VD25OH 40.8 06/26/2022    Exercise : walking a lot and some running  Uses her breaks to walk / also in her yard  Abd crunches   Some alcohol- a beer occational on weekend  Is mindful    Mood    01/01/2023    4:00 PM 10/11/2021     9:57 AM 07/28/2020    8:38 AM 07/28/2019    9:13 AM 02/12/2018    4:21 PM  Depression screen PHQ 2/9  Decreased Interest 1 1 0 0 1  Down, Depressed, Hopeless 1 1 0 0 1  PHQ - 2 Score 2 2 0 0 2  Altered sleeping 0 0 0 0 0  Tired, decreased energy 0 1 1 1 1   Change in appetite 3 0 0 0 0  Feeling bad or failure about yourself  0 2 0 0 0  Trouble concentrating 1 1 0 0 0  Moving slowly or fidgety/restless 0 0 0 0 0  Suicidal thoughts 0 1 0 0 0  PHQ-9 Score 6 7 1 1 3   Difficult doing work/chores Somewhat difficult Very difficult Somewhat difficult Not difficult at all    History of depression and PMDD Takes zoloft 100 mg daily -this has helped  Also cut out her caffeine and she seems less anxous      Prediabetes Lab Results  Component Value Date   HGBA1C 5.6 06/26/2022   This was stable   Is a good eater  Eating more fish now for protein  More roasted veggies    History of iron def Lab Results  Component Value Date   WBC 6.1 10/24/2022  HGB 13.0 10/24/2022   HCT 39.8 10/24/2022   MCV 91 10/24/2022   PLT 321 10/24/2022   Lab Results  Component Value Date   IRON 96 10/24/2022   FERRITIN 6.0 (L) 07/21/2020   Was on an iron supplement    History of mild hyperlipidemia Lab Results  Component Value Date   CHOL 220 (H) 10/24/2022   HDL 68 10/24/2022   LDLCALC 134 (H) 10/24/2022   LDLDIRECT 141.4 07/10/2013   TRIG 101 10/24/2022   CHOLHDL 3.2 10/24/2022   Has cut out a lot of junk food     Other labs Lab Results  Component Value Date   NA 139 10/24/2022   K 4.3 10/24/2022   CO2 28 07/21/2020   GLUCOSE 80 10/24/2022   BUN 13 10/24/2022   CREATININE 0.83 10/24/2022   CALCIUM 8.7 10/24/2022   GFR 86.66 07/21/2020   EGFR 85 10/24/2022   GFRNONAA 73 04/21/2018   Lab Results  Component Value Date   ALT 10 10/24/2022   AST 17 10/24/2022   GGT 10 10/24/2022   ALKPHOS 48 10/24/2022   BILITOT 0.2 10/24/2022   Lab Results  Component Value Date   TSH  1.610 10/24/2022         Patient Active Problem List   Diagnosis Date Noted   Colon cancer screening 04/19/2022   Vitamin D deficiency 10/11/2021   Mitral valve prolapse 08/06/2020   History of depression 08/06/2020   Screening for STD (sexually transmitted disease) 07/28/2020   Mild hyperlipidemia 07/28/2019   Cervical intraepithelial neoplasia grade III with severe dysplasia 07/18/2018   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   Encounter for annual routine gynecological examination 05/05/2018   Prediabetes 05/05/2018   Iron deficiency anemia 05/05/2018   Premenstrual dysphoric disorder 02/12/2018   Allergic rhinitis 02/12/2018   External hemorrhoids 02/12/2018   Routine general medical examination at a health care facility 07/15/2012   Depression 04/09/2011   Past Medical History:  Diagnosis Date   Depression    Mitral valve prolapse    Past Surgical History:  Procedure Laterality Date   BREAST CYST ASPIRATION Right 11/02/2020   Social History   Tobacco Use   Smoking status: Never   Smokeless tobacco: Never  Substance Use Topics   Alcohol use: Yes    Alcohol/week: 0.0 standard drinks of alcohol    Comment: occasional   Drug use: Never   Family History  Problem Relation Age of Onset   Prostate cancer Father    Breast cancer Paternal Grandmother    Breast cancer Maternal Aunt    Cancer Maternal Aunt 8       breast   Esophageal cancer Neg Hx    Liver disease Neg Hx    Allergies  Allergen Reactions   Sulfa Antibiotics Shortness Of Breath   Current Outpatient Medications on File Prior to Visit  Medication Sig Dispense Refill   diphenhydrAMINE (BENADRYL) 25 mg capsule Take 25 mg by mouth at bedtime as needed for itching.     levonorgestrel-ethinyl estradiol (SRONYX) 0.1-20 MG-MCG tablet TAKE 1 TABLET BY MOUTH EVERY DAY 84 tablet 1   Multiple Vitamin (MULTIVITAMIN) capsule Take 1 capsule by mouth daily.     Probiotic Product (PROBIOTIC PO) Take 1 Capful by  mouth daily.     sertraline (ZOLOFT) 100 MG tablet Take 1 tablet (100 mg total) by mouth daily. 90 tablet 0   ferrous sulfate 325 (65 FE) MG tablet TAKE 1 TABLET BY MOUTH EVERY DAY (Patient  not taking: Reported on 01/01/2023) 90 tablet 3   No current facility-administered medications on file prior to visit.    Review of Systems  Constitutional:  Positive for fatigue. Negative for activity change, appetite change, fever and unexpected weight change.  HENT:  Negative for congestion, ear pain, rhinorrhea, sinus pressure and sore throat.   Eyes:  Negative for pain, redness and visual disturbance.  Respiratory:  Negative for cough, shortness of breath and wheezing.   Cardiovascular:  Negative for chest pain and palpitations.  Gastrointestinal:  Negative for abdominal pain, blood in stool, constipation and diarrhea.  Endocrine: Negative for polydipsia and polyuria.  Genitourinary:  Negative for dysuria, frequency and urgency.  Musculoskeletal:  Negative for arthralgias, back pain and myalgias.  Skin:  Negative for pallor and rash.  Allergic/Immunologic: Negative for environmental allergies.  Neurological:  Negative for dizziness, syncope and headaches.  Hematological:  Negative for adenopathy. Does not bruise/bleed easily.  Psychiatric/Behavioral:  Positive for dysphoric mood. Negative for decreased concentration. The patient is not nervous/anxious.        Mood is improved        Objective:   Physical Exam Constitutional:      General: She is not in acute distress.    Appearance: Normal appearance. She is well-developed and normal weight. She is not ill-appearing or diaphoretic.  HENT:     Head: Normocephalic and atraumatic.     Right Ear: Tympanic membrane, ear canal and external ear normal.     Left Ear: Tympanic membrane, ear canal and external ear normal.     Nose: Nose normal. No congestion.     Mouth/Throat:     Mouth: Mucous membranes are moist.     Pharynx: Oropharynx is clear.  No posterior oropharyngeal erythema.  Eyes:     General: No scleral icterus.    Extraocular Movements: Extraocular movements intact.     Conjunctiva/sclera: Conjunctivae normal.     Pupils: Pupils are equal, round, and reactive to light.  Neck:     Thyroid: No thyromegaly.     Vascular: No carotid bruit or JVD.  Cardiovascular:     Rate and Rhythm: Normal rate and regular rhythm.     Pulses: Normal pulses.     Heart sounds: Normal heart sounds.     No gallop.  Pulmonary:     Effort: Pulmonary effort is normal. No respiratory distress.     Breath sounds: Normal breath sounds. No wheezing.     Comments: Good air exch Chest:     Chest wall: No tenderness.  Abdominal:     General: Bowel sounds are normal. There is no distension or abdominal bruit.     Palpations: Abdomen is soft. There is no mass.     Tenderness: There is no abdominal tenderness.     Hernia: No hernia is present.  Genitourinary:    Comments: Breast exam: No mass, nodules, thickening, tenderness, bulging, retraction, inflamation, nipple discharge or skin changes noted.  No axillary or clavicular LA.     Musculoskeletal:        General: No tenderness. Normal range of motion.     Cervical back: Normal range of motion and neck supple. No rigidity. No muscular tenderness.     Right lower leg: No edema.     Left lower leg: No edema.     Comments: No kyphosis   Lymphadenopathy:     Cervical: No cervical adenopathy.  Skin:    General: Skin is warm and dry.  Coloration: Skin is not pale.     Findings: No erythema or rash.     Comments: Mildly tanned  Solar lentigines diffusely   Neurological:     Mental Status: She is alert. Mental status is at baseline.     Cranial Nerves: No cranial nerve deficit.     Motor: No abnormal muscle tone.     Coordination: Coordination normal.     Gait: Gait normal.     Deep Tendon Reflexes: Reflexes are normal and symmetric. Reflexes normal.  Psychiatric:        Mood and Affect:  Mood normal.        Cognition and Memory: Cognition and memory normal.     Comments: cheerful           Assessment & Plan:   Problem List Items Addressed This Visit       Genitourinary   Cervical intraepithelial neoplasia grade III with severe dysplasia    Sent for last pap report from gyn Per pt-has had her HPV vaccines         Other   Vitamin D deficiency    Last vitamin D Lab Results  Component Value Date   VD25OH 40.8 06/26/2022   Vitamin D level is therapeutic with current supplementation Disc importance of this to bone and overall health       Routine general medical examination at a health care facility - Primary    Reviewed health habits including diet and exercise and skin cancer prevention Reviewed appropriate screening tests for age  Also reviewed health mt list, fam hx and immunization status , as well as social and family history   See HPI Labs reviewed and ordered Sent for last pap report from gyn Continues OC/ is perimenopausal  Mammog utd 06/2022 Colonscopy utd 08/2022 Pt [facility] shot Tdap updated today Discussed bone health, vit D level is normal , encouraged strength training  PHQ of 6, improved       Relevant Orders   Tdap vaccine greater than or equal to 7yo IM (Completed)   Premenstrual dysphoric disorder    Sertraline continues to help In perimenopause now      Prediabetes    Lab Results  Component Value Date   HGBA1C 5.6 06/26/2022   disc imp of low glycemic diet and wt loss to prevent DM2       Mild hyperlipidemia    Disc goals for lipids and reasons to control them Rev last labs with pt from May Diet has improved 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

## 2023-01-01 NOTE — Assessment & Plan Note (Signed)
Sertraline continues to help In perimenopause now

## 2023-01-01 NOTE — Assessment & Plan Note (Signed)
Last vitamin D Lab Results  Component Value Date   VD25OH 40.8 06/26/2022   Vitamin D level is therapeutic with current supplementation Disc importance of this to bone and overall health

## 2023-01-01 NOTE — Assessment & Plan Note (Signed)
Pt plans to take iron as long as she menstuates Pending labs from work

## 2023-01-03 ENCOUNTER — Telehealth: Payer: Self-pay | Admitting: Registered Nurse

## 2023-01-03 DIAGNOSIS — Z20822 Contact with and (suspected) exposure to covid-19: Secondary | ICD-10-CM

## 2023-01-03 MED ORDER — COVID-19 ANTIGEN TEST VI KIT: 1.0000 [IU] | PACK | Freq: Every day | 1 refills | Status: DC | PRN

## 2023-01-03 NOTE — Telephone Encounter (Addendum)
Test day 6 after contact or if symptoms develop sooner and repeat test x 2 48 hours apart if initial test negative and having symptoms.  Wear mask when around others x 10 days from contact and recommend not eating in employee lunch room x 10 days  Patient asked when next covid vaccine due discussed new formula to be released Sep 2024.  Patient stated she did receive 1 booster after Sep 2023 formula released discussed she was up to date with her covid vaccines at this time  Patient had contact with employee yesterday that tested positive for covid also.  Discussed test immediately if symptoms and if negative retest again in 48 hours x 2.  If no symptoms test 6 days after exposure x 1.  Wear mask when around others; monitor for symptoms runny nose, congestion, sore throat, body aches, headache, loss of taste/smell, cough, fever, chills or fatigue x 10 days.  Discussed close contact 15 minutes within 6 feet without mask in 24 hours.  Discussed wear mask at work 10 days and no eating in employee lunch room x 10 days.  Patient asymptomatic at this time, agreed with plan of care and had no further questions at this time.  Electronic Rx sent to her pharmacy of choice covid antigen test daily prn use #4 RF1.  Patient sent email with CDC covid information and discussed cases [place] and [place] increasing/junior olympics being held in [place] this week expect more possible cases if out in community this week.  Showed patient infographic with current national rates/color codes of wastewater levels across the nation.  Discussed locally [place] highest levels at this time but increasing in [place] and [place].  Patient A&Ox3 spoke full sentences without difficulty no audible nasal congestion/rhinitis/cough.  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

## 2023-01-04 ENCOUNTER — Encounter: Payer: Self-pay | Admitting: Registered Nurse

## 2023-01-05 NOTE — Progress Notes (Signed)
Patient met with RN [name] in clinic 10/25/22 reviewed lab results and given printed copy of results and results notes/instructions.  Saw PCM 01/01/23 follow up labs.  Weight 121lbs  Provider signed Be Well paperwork 10/25/22 and RN [name] notified HR team met requirements for insurance discount FY 2025

## 2023-01-20 NOTE — Telephone Encounter (Signed)
Negative test feeling well denied concerns A&Ox3 seen in workcenter spoke full sentences without difficulty gait sure and steady respirations even and unlabored skin warm dry and pink no audible cough/congestion/throat clearing observed.  Encounter closed.

## 2023-02-08 ENCOUNTER — Telehealth: Payer: Self-pay | Admitting: Registered Nurse

## 2023-02-08 DIAGNOSIS — U071 COVID-19: Secondary | ICD-10-CM

## 2023-02-08 MED ORDER — MOLNUPIRAVIR EUA 200MG CAPSULE: 4.0000 | ORAL_CAPSULE | Freq: Two times a day (BID) | ORAL | 0 refills | Status: AC

## 2023-02-08 NOTE — Telephone Encounter (Signed)
Patient contacted NP wanted to schedule appt today as  I'm not feeling well - headache, fatigue, diarrhea, sore throat, I don't know if I have a fever or if it's just hot flashes ??. I tested last night for COVID and it came back negative.  I also thing I may have conjunctivitis in my left eye..  Patient notified Clinic is closed until Tuesday.  Can you send me a picture of your eye?  Reminder normal clinic hours nurse Monday-Thursday 08a-5p and NP Tues 9-12 and Th 11-2.  Any discharge this morning?  Covid has been taking longer to test positive after symptoms start.  Retest again tomorrow and if negative retest a third time again in another 48 hours.    Do you want me to send in rx for antivirals in case you turn positive over weekend so you can pick up?  There is also a stomach bug going around.  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  Rx for molnupirivar sent to her pharmacy of choice.  Took for previous covid infection without difficulty.  Per epocrates should not have sertraline and paxlovid as medication interactions raises sertraline levels and possible prolonged QT syndrome.  Exitcare handout on covid, foods to relieve diarrhea and viral gastroenteritis sent to patient my chart.  Patient agreed with plan of care and had no further questions at this time.

## 2023-02-10 NOTE — Telephone Encounter (Signed)
Patient notified NP tested positive for covid today and started antiviral today.  Quarantining at home.  HR team and supervisor notified excused absence 31 Aug - 4 Sep expected RTW with mask wear 5 Sep

## 2023-02-13 NOTE — Telephone Encounter (Signed)
Patient reported she is still feeling spacey or foggy but denied headache, fever/chills, n/v/d  Feeling well enough to return to work onsite tomorrow with mask.  Tolerating antiviral without difficulty.  Discussed with patient use normal call out procedures in am if wakes up with new or worsening symptoms.  Supervisor and HR notified cleared to return onsite tomorrow with mask.

## 2023-02-17 NOTE — Telephone Encounter (Signed)
Patient reported slept most of the day 02/15/23 and when woke up in evening feeling much better.  Headache, rhinitis resolved.  Still a little fatigued.  Denied n/v/d/f/c.  Feels well enough to work tomorrow.  Discussed use normal call out procedures if worsening symptoms in am.  HR notified cleared to return onsite with mask wear through 02/18/23.  Patient notified no eating in employee lunchroom tomorrow and mask wear when around others Day 10.  Discussed RN Burna Mortimer in clinic tomorrow and I return onsite 02/19/23 if she needs further assistance.  Patient stated tolerating po intake without difficulty A&Ox3 spoke full sentences without difficulty no audible cough/congestion/throat clearing  or nasal sniffing.  Duration of call 3 minutes.

## 2023-02-22 NOTE — Telephone Encounter (Signed)
Patient reported feeling well recovered completely and working without difficulty this week.  Denied further questions or concerns.  Day 10 02/18/23  encounter closed.

## 2023-02-27 ENCOUNTER — Telehealth: Payer: Self-pay | Admitting: Family Medicine

## 2023-02-27 NOTE — Telephone Encounter (Signed)
Spoke to pt, scheduled f/u for 9/24

## 2023-02-27 NOTE — Telephone Encounter (Signed)
Pt called asking for Dr. Milinda Antis to take a look at her thyroid levels from labs back in May. Pt states she curious if her energy level is low due to her thyroid. Pt also wanted to let Dr. Milinda Antis know that her Gynecologist is Dr. Suella Grove from [facility] in [place]. Call back # [phone]

## 2023-02-27 NOTE — Telephone Encounter (Signed)
Based on labs I doubt it but it may be worth a conversation about fatigue  Please schedule follow up in office We may do some more lab work also  (don't schedule for 4 pm)  Thanks

## 2023-03-05 ENCOUNTER — Encounter: Payer: Self-pay | Admitting: Family Medicine

## 2023-03-05 ENCOUNTER — Ambulatory Visit: Payer: No Typology Code available for payment source | Admitting: Family Medicine

## 2023-03-05 VITALS — BP 114/78 | HR 77 | Temp 97.6°F | Ht 62.0 in | Wt 108.4 lb

## 2023-03-05 DIAGNOSIS — F329 Major depressive disorder, single episode, unspecified: Secondary | ICD-10-CM

## 2023-03-05 DIAGNOSIS — D509 Iron deficiency anemia, unspecified: Secondary | ICD-10-CM

## 2023-03-05 DIAGNOSIS — R5382 Chronic fatigue, unspecified: Secondary | ICD-10-CM

## 2023-03-05 DIAGNOSIS — R5383 Other fatigue: Secondary | ICD-10-CM | POA: Insufficient documentation

## 2023-03-05 LAB — CBC WITH DIFFERENTIAL/PLATELET
Basophils Absolute: 0 10*3/uL (ref 0.0–0.1)
Basophils Relative: 0.6 % (ref 0.0–3.0)
Eosinophils Absolute: 0 10*3/uL (ref 0.0–0.7)
Eosinophils Relative: 0.7 % (ref 0.0–5.0)
HCT: 38.4 % (ref 36.0–46.0)
Hemoglobin: 12.6 g/dL (ref 12.0–15.0)
Lymphocytes Relative: 18.8 % (ref 12.0–46.0)
Lymphs Abs: 1.3 10*3/uL (ref 0.7–4.0)
MCHC: 33 g/dL (ref 30.0–36.0)
MCV: 92.6 fl (ref 78.0–100.0)
Monocytes Absolute: 0.2 10*3/uL (ref 0.1–1.0)
Monocytes Relative: 3 % (ref 3.0–12.0)
Neutro Abs: 5.1 10*3/uL (ref 1.4–7.7)
Neutrophils Relative %: 76.9 % (ref 43.0–77.0)
Platelets: 299 10*3/uL (ref 150.0–400.0)
RBC: 4.14 Mil/uL (ref 3.87–5.11)
RDW: 13 % (ref 11.5–15.5)
WBC: 6.6 10*3/uL (ref 4.0–10.5)

## 2023-03-05 LAB — T4, FREE: Free T4: 0.68 ng/dL (ref 0.60–1.60)

## 2023-03-05 LAB — IRON: Iron: 36 ug/dL — ABNORMAL LOW (ref 42–145)

## 2023-03-05 LAB — TSH: TSH: 1.57 u[IU]/mL (ref 0.35–5.50)

## 2023-03-05 NOTE — Assessment & Plan Note (Signed)
May be depression related Ref labs from may  Will re check tsh and ft4 On oc  Cbc/iron as well   Plans to follow up with psyciatry and counseling also

## 2023-03-05 NOTE — Progress Notes (Signed)
Subjective:    Patient ID: Kara [name], female    DOB: [birth year], 52 y.o.   MRN: [id number]  HPI  Wt Readings from Last 3 Encounters:  03/05/23 108 lb 6 oz (49.2 kg)  01/01/23 114 lb 4 oz (51.8 kg)  08/20/22 121 lb (54.9 kg)   19.82 kg/m  Vitals:   03/05/23 0930  BP: 114/78  Pulse: 77  Temp: 97.6 F (36.4 C)  SpO2: 98%    Pt presents with c/o fatigue / depression    Thinks her antidepressants are not working well for her  She has appointment with psychiatrist = has appointment next Monday    Feeling down more often Not tearful  Feels more numb than anything else  Also more irritable for no reason  Some panic attacks also  ? Social anxiety  Sleep - fine  Appetite - is good / lost some weight and not sure why   Signed up with a therapist- 3 sessions already Does virtually   Learned that journaling is more important than she thought  Becoming more aware  Now more /better self care    Saw gyn- is on OC and that helps/ perimenopause  Has cut coffee down /some decaf  Also cut her alcohol intake and plans to cut out completely      03/05/2023   10:09 AM 01/01/2023    4:00 PM 10/11/2021    9:57 AM 07/28/2020    8:38 AM 07/28/2019    9:13 AM  Depression screen PHQ 2/9  Decreased Interest 2 1 1  0 0  Down, Depressed, Hopeless 2 1 1  0 0  PHQ - 2 Score 4 2 2  0 0  Altered sleeping 2 0 0 0 0  Tired, decreased energy 0 0 1 1 1   Change in appetite 0 3 0 0 0  Feeling bad or failure about yourself  2 0 2 0 0  Trouble concentrating 2 1 1  0 0  Moving slowly or fidgety/restless 0 0 0 0 0  Suicidal thoughts 0 0 1 0 0  PHQ-9 Score 10 6 7 1 1   Difficult doing work/chores  Somewhat difficult Very difficult Somewhat difficult Not difficult at all      03/05/2023   10:10 AM 01/01/2023    4:00 PM  GAD 7 : Generalized Anxiety Score  Nervous, Anxious, on Edge 1 0  Control/stop worrying 1 0  Worry too much - different things 1 0  Trouble relaxing 1 0  Restless 0 0   Easily annoyed or irritable 2 2  Afraid - awful might happen 0 0  Total GAD 7 Score 6 2  Anxiety Difficulty Somewhat difficult Somewhat difficult        Had labs at replacements  / work place  Lab Results  Component Value Date   TSH 1.57 03/05/2023   FT4 6.4  Free Thyroxine index 1.3  T 3 uptake ratio 20   No family history of thyroid problems    Lab Results  Component Value Date   WBC 6.6 03/05/2023   HGB 12.6 03/05/2023   HCT 38.4 03/05/2023   MCV 92.6 03/05/2023   PLT 299.0 03/05/2023   Lab Results  Component Value Date   IRON 36 (L) 03/05/2023   FERRITIN 6.0 (L) 07/21/2020    Lab Results  Component Value Date   NA 139 10/24/2022   K 4.3 10/24/2022   CO2 28 07/21/2020   GLUCOSE 80 10/24/2022   BUN 13 10/24/2022  CREATININE 0.83 10/24/2022   CALCIUM 8.7 10/24/2022   GFR 86.66 07/21/2020   EGFR 85 10/24/2022   GFRNONAA 73 04/21/2018   Lab Results  Component Value Date   ALT 10 10/24/2022   AST 17 10/24/2022   GGT 10 10/24/2022   ALKPHOS 48 10/24/2022   BILITOT 0.2 10/24/2022    Lab Results  Component Value Date   HGBA1C 5.6 06/26/2022   Last vitamin D Lab Results  Component Value Date   VD25OH 40.8 06/26/2022       Patient Active Problem List   Diagnosis Date Noted   Fatigue 03/05/2023   Colon cancer screening 04/19/2022   Vitamin D deficiency 10/11/2021   Mitral valve prolapse 08/06/2020   History of depression 08/06/2020   Screening for STD (sexually transmitted disease) 07/28/2020   Mild hyperlipidemia 07/28/2019   Cervical intraepithelial neoplasia grade III with severe dysplasia 07/18/2018   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   Encounter for annual routine gynecological examination 05/05/2018   Prediabetes 05/05/2018   Iron deficiency anemia 05/05/2018   Premenstrual dysphoric disorder 02/12/2018   Allergic rhinitis 02/12/2018   External hemorrhoids 02/12/2018   Routine general medical examination at a health care facility  07/15/2012   Depression 04/09/2011   Past Medical History:  Diagnosis Date   Depression    Mitral valve prolapse    Past Surgical History:  Procedure Laterality Date   BREAST CYST ASPIRATION Right 11/02/2020   Social History   Tobacco Use   Smoking status: Never   Smokeless tobacco: Never  Substance Use Topics   Alcohol use: Yes    Alcohol/week: 0.0 standard drinks of alcohol    Comment: occasional   Drug use: Never   Family History  Problem Relation Age of Onset   Prostate cancer Father    Breast cancer Paternal Grandmother    Breast cancer Maternal Aunt    Cancer Maternal Aunt 32       breast   Esophageal cancer Neg Hx    Liver disease Neg Hx    Allergies  Allergen Reactions   Sulfa Antibiotics Shortness Of Breath   Current Outpatient Medications on File Prior to Visit  Medication Sig Dispense Refill   COVID-19 Antigen Test KIT 1 Units by In Vitro route daily as needed. 4 kit 1   diphenhydrAMINE (BENADRYL) 25 mg capsule Take 25 mg by mouth at bedtime as needed for itching.     ferrous sulfate 325 (65 FE) MG tablet TAKE 1 TABLET BY MOUTH EVERY DAY 90 tablet 3   levonorgestrel-ethinyl estradiol (SRONYX) 0.1-20 MG-MCG tablet TAKE 1 TABLET BY MOUTH EVERY DAY 84 tablet 1   Multiple Vitamin (MULTIVITAMIN) capsule Take 1 capsule by mouth daily.     sertraline (ZOLOFT) 100 MG tablet Take 1 tablet (100 mg total) by mouth daily. 90 tablet 0   No current facility-administered medications on file prior to visit.    Review of Systems  Constitutional:  Positive for fatigue. Negative for activity change, appetite change, fever and unexpected weight change.  HENT:  Negative for congestion, ear pain, rhinorrhea, sinus pressure and sore throat.   Eyes:  Negative for pain, redness and visual disturbance.  Respiratory:  Negative for cough, shortness of breath and wheezing.   Cardiovascular:  Negative for chest pain and palpitations.  Gastrointestinal:  Negative for abdominal  pain, blood in stool, constipation and diarrhea.  Endocrine: Negative for polydipsia and polyuria.  Genitourinary:  Negative for dysuria, frequency and urgency.  Musculoskeletal:  Negative  for arthralgias, back pain and myalgias.  Skin:  Negative for pallor and rash.  Allergic/Immunologic: Negative for environmental allergies.  Neurological:  Negative for dizziness, syncope and headaches.  Hematological:  Negative for adenopathy. Does not bruise/bleed easily.  Psychiatric/Behavioral:  Positive for dysphoric mood. Negative for decreased concentration. The patient is not nervous/anxious.        Objective:   Physical Exam Constitutional:      General: She is not in acute distress.    Appearance: Normal appearance. She is well-developed and normal weight. She is not ill-appearing or diaphoretic.  HENT:     Head: Normocephalic and atraumatic.  Eyes:     Conjunctiva/sclera: Conjunctivae normal.     Pupils: Pupils are equal, round, and reactive to light.  Neck:     Thyroid: No thyromegaly.     Vascular: No carotid bruit or JVD.  Cardiovascular:     Rate and Rhythm: Normal rate and regular rhythm.     Heart sounds: Normal heart sounds.     No gallop.  Pulmonary:     Effort: Pulmonary effort is normal. No respiratory distress.     Breath sounds: Normal breath sounds. No wheezing or rales.  Abdominal:     General: There is no distension or abdominal bruit.     Palpations: Abdomen is soft.  Musculoskeletal:     Cervical back: Normal range of motion and neck supple. No tenderness.     Right lower leg: No edema.     Left lower leg: No edema.  Lymphadenopathy:     Cervical: No cervical adenopathy.  Skin:    General: Skin is warm and dry.     Coloration: Skin is not pale.     Findings: No rash.  Neurological:     Mental Status: She is alert.     Coordination: Coordination normal.     Deep Tendon Reflexes: Reflexes are normal and symmetric. Reflexes normal.  Psychiatric:        Mood  and Affect: Mood normal.           Assessment & Plan:   Problem List Items Addressed This Visit       Other   Depression - Primary    This has been worse/possible rel to perimenopause Taking zoloft 100 mg  Has appointment with psychiatry on Monday   Seeing counselor  Good insight  Working on self care as well  Encouraged exercise/ less caffeine and alcohol   Some lab today for fatigue (may be depression related)      Fatigue    May be depression related Ref labs from may  Will re check tsh and ft4 On oc  Cbc/iron as well   Plans to follow up with psyciatry and counseling also      Relevant Orders   T4, free (Completed)   TSH (Completed)   CBC with Differential/Platelet (Completed)   Iron deficiency anemia    More fatigue  Cbc and iron today   Likely normal= the OC is controlling bleeding       Relevant Orders   CBC with Differential/Platelet (Completed)   Iron (Completed)

## 2023-03-05 NOTE — Assessment & Plan Note (Signed)
This has been worse/possible rel to perimenopause Taking zoloft 100 mg  Has appointment with psychiatry on Monday   Seeing counselor  Good insight  Working on self care as well  Encouraged exercise/ less caffeine and alcohol   Some lab today for fatigue (may be depression related)

## 2023-03-05 NOTE — Patient Instructions (Addendum)
Continue counseling  Practice good self care / including regular exercise and jounaling   Lab today   See psychiatry as planned

## 2023-03-05 NOTE — Assessment & Plan Note (Signed)
More fatigue  Cbc and iron today   Likely normal= the OC is controlling bleeding

## 2023-03-14 ENCOUNTER — Other Ambulatory Visit: Payer: Self-pay | Admitting: Family Medicine

## 2023-04-04 ENCOUNTER — Ambulatory Visit: Payer: Self-pay

## 2023-04-04 DIAGNOSIS — Z23 Encounter for immunization: Secondary | ICD-10-CM

## 2023-04-18 ENCOUNTER — Other Ambulatory Visit: Payer: Self-pay | Admitting: Family Medicine

## 2023-06-25 ENCOUNTER — Other Ambulatory Visit: Payer: Self-pay | Admitting: Family Medicine

## 2023-06-25 DIAGNOSIS — Z1231 Encounter for screening mammogram for malignant neoplasm of breast: Secondary | ICD-10-CM

## 2023-07-10 ENCOUNTER — Ambulatory Visit
Admission: RE | Admit: 2023-07-10 | Discharge: 2023-07-10 | Disposition: A | Discharge status: Home / Self Care | Payer: No Typology Code available for payment source | Source: Ambulatory Visit | Attending: Family Medicine | Admitting: Family Medicine

## 2023-07-10 DIAGNOSIS — Z1231 Encounter for screening mammogram for malignant neoplasm of breast: Secondary | ICD-10-CM

## 2023-07-15 ENCOUNTER — Other Ambulatory Visit: Payer: Self-pay | Admitting: Family Medicine

## 2023-07-15 DIAGNOSIS — R928 Other abnormal and inconclusive findings on diagnostic imaging of breast: Secondary | ICD-10-CM

## 2023-07-16 ENCOUNTER — Telehealth: Payer: Self-pay | Admitting: *Deleted

## 2023-07-16 NOTE — Telephone Encounter (Signed)
If she sees psychiatry it needs to come from them Thanks

## 2023-07-16 NOTE — Telephone Encounter (Signed)
 Copied from CRM [id number]. Topic: Clinical - Prescription Issue >> Jul 16, 2023 12:34 PM Burnard DEL wrote: Reason for CRM: patient medication  sertraline  (ZOLOFT ) 100 MG tablet  Was increased to 200 mg by her psychiatrist.She is wondering if this new script is suppose to be sent into pharmacy by Dr Randeen or by her psychiatrist,because when she went to the pharmacy to get medication,it was only filled for the 100 mg.

## 2023-07-17 NOTE — Telephone Encounter (Signed)
 Pt.notified

## 2023-07-23 ENCOUNTER — Ambulatory Visit
Admission: RE | Admit: 2023-07-23 | Discharge: 2023-07-23 | Disposition: A | Discharge status: Home / Self Care | Payer: No Typology Code available for payment source | Source: Ambulatory Visit | Attending: Family Medicine | Admitting: Family Medicine

## 2023-07-23 ENCOUNTER — Encounter: Payer: Self-pay | Admitting: Family Medicine

## 2023-07-23 DIAGNOSIS — R928 Other abnormal and inconclusive findings on diagnostic imaging of breast: Secondary | ICD-10-CM

## 2023-08-05 ENCOUNTER — Other Ambulatory Visit: Payer: Self-pay | Admitting: Family Medicine

## 2023-08-05 NOTE — Telephone Encounter (Signed)
 Copied from CRM [id number]. Topic: Clinical - Medication Refill >> Aug 05, 2023  3:54 PM Marica Otter wrote: Most Recent Primary Care Visit:  Provider: Roxy Manns A  Department: [facility]  Visit Type: OFFICE VISIT  Date: 03/05/2023  Medication: SRONYX 0.1-20 MG-MCG tablet  Has the patient contacted their pharmacy? Yes, states the sent in a request none on file (Agent: If no, request that the patient contact the pharmacy for the refill. If patient does not wish to contact the pharmacy document the reason why and proceed with request.) (Agent: If yes, when and what did the pharmacy advise?)  Is this the correct pharmacy for this prescription? Yes If no, delete pharmacy and type the correct one.  This is the patient's preferred pharmacy:  [facility] [phone] [facility] [place] - [address] [place] [address] Phone: [phone] Fax: [phone]    Has the prescription been filled recently? No  Is the patient out of the medication? Yes  Has the patient been seen for an appointment in the last year OR does the patient have an upcoming appointment? Yes  Can we respond through MyChart? No  Agent: Please be advised that Rx refills may take up to 3 business days. We ask that you follow-up with your pharmacy.

## 2023-08-05 NOTE — Telephone Encounter (Signed)
 Last Fill: 04/19/23  Last OV: 03/05/23 Next OV: None Scheduled  Routing to provider for review/authorization.

## 2023-08-08 ENCOUNTER — Other Ambulatory Visit: Payer: Self-pay | Admitting: Family Medicine

## 2023-10-27 ENCOUNTER — Other Ambulatory Visit: Payer: Self-pay | Admitting: Family Medicine

## 2023-11-06 LAB — HM PAP SMEAR
HPV 16/18/45 genotyping: NEGATIVE
HPV, high-risk: POSITIVE

## 2023-11-27 ENCOUNTER — Other Ambulatory Visit

## 2023-12-05 ENCOUNTER — Other Ambulatory Visit

## 2023-12-05 DIAGNOSIS — Z862 Personal history of diseases of the blood and blood-forming organs and certain disorders involving the immune mechanism: Secondary | ICD-10-CM

## 2023-12-05 DIAGNOSIS — Z0000 Encounter for general adult medical examination without abnormal findings: Secondary | ICD-10-CM

## 2023-12-06 ENCOUNTER — Ambulatory Visit: Payer: Self-pay | Admitting: Registered Nurse

## 2023-12-06 DIAGNOSIS — R7309 Other abnormal glucose: Secondary | ICD-10-CM

## 2023-12-06 DIAGNOSIS — E785 Hyperlipidemia, unspecified: Secondary | ICD-10-CM

## 2023-12-06 LAB — CMP12+LP+TP+TSH+6AC+CBC/D/PLT
ALT: 13 IU/L (ref 0–32)
AST: 18 IU/L (ref 0–40)
Albumin: 4.5 g/dL (ref 3.8–4.9)
Alkaline Phosphatase: 51 IU/L (ref 44–121)
BUN/Creatinine Ratio: 10 (ref 9–23)
BUN: 9 mg/dL (ref 6–24)
Basophils Absolute: 0 10*3/uL (ref 0.0–0.2)
Basos: 1 %
Bilirubin Total: 0.3 mg/dL (ref 0.0–1.2)
Calcium: 9 mg/dL (ref 8.7–10.2)
Chloride: 101 mmol/L (ref 96–106)
Chol/HDL Ratio: 3.6 ratio (ref 0.0–4.4)
Cholesterol, Total: 245 mg/dL — ABNORMAL HIGH (ref 100–199)
Creatinine, Ser: 0.93 mg/dL (ref 0.57–1.00)
EOS (ABSOLUTE): 0.1 10*3/uL (ref 0.0–0.4)
Eos: 2 %
Estimated CHD Risk: 0.6 times avg. (ref 0.0–1.0)
Free Thyroxine Index: 1.2 (ref 1.2–4.9)
GGT: 10 IU/L (ref 0–60)
Globulin, Total: 2.7 g/dL (ref 1.5–4.5)
Glucose: 103 mg/dL — ABNORMAL HIGH (ref 70–99)
HDL: 68 mg/dL (ref >39)
Hematocrit: 40.6 % (ref 34.0–46.6)
Hemoglobin: 12.7 g/dL (ref 11.1–15.9)
Immature Grans (Abs): 0 10*3/uL (ref 0.0–0.1)
Immature Granulocytes: 0 %
Iron: 70 ug/dL (ref 27–159)
LDH: 186 IU/L (ref 119–226)
LDL Chol Calc (NIH): 150 mg/dL — ABNORMAL HIGH (ref 0–99)
Lymphocytes Absolute: 1.2 10*3/uL (ref 0.7–3.1)
Lymphs: 19 %
MCH: 29.5 pg (ref 26.6–33.0)
MCHC: 31.3 g/dL — ABNORMAL LOW (ref 31.5–35.7)
MCV: 94 fL (ref 79–97)
Monocytes Absolute: 0.2 10*3/uL (ref 0.1–0.9)
Monocytes: 4 %
Neutrophils Absolute: 4.5 10*3/uL (ref 1.4–7.0)
Neutrophils: 74 %
Phosphorus: 3.1 mg/dL (ref 3.0–4.3)
Platelets: 313 10*3/uL (ref 150–450)
Potassium: 4.7 mmol/L (ref 3.5–5.2)
RBC: 4.3 x10E6/uL (ref 3.77–5.28)
RDW: 13.2 % (ref 11.7–15.4)
Sodium: 139 mmol/L (ref 134–144)
T3 Uptake Ratio: 20 % — ABNORMAL LOW (ref 24–39)
T4, Total: 6.2 ug/dL (ref 4.5–12.0)
TSH: 1.87 u[IU]/mL (ref 0.450–4.500)
Total Protein: 7.2 g/dL (ref 6.0–8.5)
Triglycerides: 153 mg/dL — ABNORMAL HIGH (ref 0–149)
Uric Acid: 3.3 mg/dL (ref 3.0–7.2)
VLDL Cholesterol Cal: 27 mg/dL (ref 5–40)
WBC: 6 10*3/uL (ref 3.4–10.8)
eGFR: 74 mL/min/{1.73_m2} (ref >59)

## 2023-12-06 LAB — HEMOGLOBIN A1C
Est. average glucose Bld gHb Est-mCnc: 108 mg/dL
Hgb A1c MFr Bld: 5.4 % (ref 4.8–5.6)

## 2023-12-24 ENCOUNTER — Encounter: Payer: Self-pay | Admitting: Registered Nurse

## 2023-12-24 NOTE — Telephone Encounter (Signed)
 NP completed provider portion be well 2026 forms.  Hgba1 Lab 5.4 and BP met requirements for insurance discount  LDL elevated 150 did not.  Message left for patient to come to clinic to sign 2026 Be Well ROI and tobacco attestation with RN Apolinar.

## 2024-01-06 ENCOUNTER — Encounter: Payer: Self-pay | Admitting: Family Medicine

## 2024-01-06 ENCOUNTER — Ambulatory Visit (INDEPENDENT_AMBULATORY_CARE_PROVIDER_SITE_OTHER): Admitting: Family Medicine

## 2024-01-06 VITALS — BP 116/74 | HR 74 | Temp 97.4°F | Ht 62.5 in | Wt 122.0 lb

## 2024-01-06 DIAGNOSIS — Z0000 Encounter for general adult medical examination without abnormal findings: Secondary | ICD-10-CM

## 2024-01-06 DIAGNOSIS — F3281 Premenstrual dysphoric disorder: Secondary | ICD-10-CM

## 2024-01-06 DIAGNOSIS — D509 Iron deficiency anemia, unspecified: Secondary | ICD-10-CM

## 2024-01-06 DIAGNOSIS — E559 Vitamin D deficiency, unspecified: Secondary | ICD-10-CM

## 2024-01-06 DIAGNOSIS — E785 Hyperlipidemia, unspecified: Secondary | ICD-10-CM | POA: Diagnosis not present

## 2024-01-06 DIAGNOSIS — Z1211 Encounter for screening for malignant neoplasm of colon: Secondary | ICD-10-CM

## 2024-01-06 DIAGNOSIS — R7303 Prediabetes: Secondary | ICD-10-CM | POA: Diagnosis not present

## 2024-01-06 DIAGNOSIS — F329 Major depressive disorder, single episode, unspecified: Secondary | ICD-10-CM

## 2024-01-06 NOTE — Assessment & Plan Note (Signed)
 Reviewed health habits including diet and exercise and skin cancer prevention Reviewed appropriate screening tests for age  Also reviewed health mt list, fam hx and immunization status , as well as social and family history   See HPI Labs reviewed and ordered Health Maintenance  Topic Date Due   Pneumococcal Vaccination (1 of 2 - PCV) Never done   Hepatitis B Vaccine (1 of 3 - 19+ 3-dose series) Never done   Zoster (Shingles) Vaccine (1 of 2) Never done   COVID-19 Vaccine (6 - Pfizer risk 2024-25 season) 01/22/2024*   Yearly kidney health urinalysis for diabetes  01/01/2028*   Flu Shot  01/10/2024   Hemoglobin A1C  06/05/2024   Mammogram  07/09/2024   Yearly kidney function blood test for diabetes  12/04/2024   Pap with HPV screening  05/26/2027   Colon Cancer Screening  08/20/2027   DTaP/Tdap/Td vaccine (3 - Td or Tdap) 12/31/2032   Hepatitis C Screening  Completed   HIV Screening  Completed   HPV Vaccine  Aged Out   Meningitis B Vaccine  Aged Out   Complete foot exam   Discontinued   Eye exam for diabetics  Discontinued  *Topic was postponed. The date shown is not the original due date.    Had first shingrix, planning the 2nd Discussed fall prevention, supplements and exercise for bone density  PHQ 3 - improved with treatment

## 2024-01-06 NOTE — Assessment & Plan Note (Signed)
 Disc goals for lipids and reasons to control them Rev last labs with pt Rev low sat fat diet in detail LDL 150  Will continue to monitor  Mother has high chol  ASCVD score 2.4%

## 2024-01-06 NOTE — Assessment & Plan Note (Signed)
 Urged to continue vitamin D3 2000 international units daily for bone and overall health

## 2024-01-06 NOTE — Assessment & Plan Note (Addendum)
 Prediabetes  Lab Results  Component Value Date   HGBA1C 5.4 12/05/2023   HGBA1C 5.6 06/26/2022   HGBA1C 5.7 (H) 09/14/2021    disc imp of low glycemic diet and wt loss to prevent DM2

## 2024-01-06 NOTE — Patient Instructions (Addendum)
 Get your 2nd shingrix vaccine when you can    Make sure you are getting at least 2000 international units vitamin D3 daily   Stay active  Add some strength training to your routine, this is important for bone and brain health and can reduce your risk of falls and help your body use insulin properly and regulate weight  Light weights, exercise bands , and internet videos are a good way to start  Yoga (chair or regular), machines , floor exercises or a gym with machines are also good options   For cholesterol Avoid red meat/ fried foods/ egg yolks/ fatty breakfast meats/ butter, cheese and high fat dairy/ and shellfish

## 2024-01-06 NOTE — Assessment & Plan Note (Signed)
 Doing well with sertraline  Under gyn care Also low dose OC

## 2024-01-06 NOTE — Assessment & Plan Note (Signed)
 Better with lighter menses Not taking iron 

## 2024-01-06 NOTE — Assessment & Plan Note (Signed)
Colonoscopy utd 08/2022

## 2024-01-06 NOTE — Progress Notes (Signed)
 Subjective:    Patient ID: Kara [name], female    DOB: 1970/06/24, 53 y.o.   MRN: [id number]  HPI  Here for health maintenance exam and to review chronic medical problems   Wt Readings from Last 3 Encounters:  01/06/24 122 lb (55.3 kg)  03/05/23 108 lb 6 oz (49.2 kg)  01/01/23 114 lb 4 oz (51.8 kg)   21.96 kg/m  Vitals:   01/06/24 0812  BP: 116/74  Pulse: 74  Temp: (!) 97.4 F (36.3 C)  SpO2: 95%    Immunization History  Administered Date(s) Administered   HPV 9-valent 08/12/2018, 11/14/2018, 03/17/2019   Influenza Inj Mdck Quad Pf 03/24/2019   Influenza Split 04/07/2012   Influenza, Quadrivalent, Recombinant, Inj, Pf 04/23/2017   Influenza, Seasonal, Injecte, Preservative Fre 04/04/2023   Influenza,inj,Quad PF,6+ Mos 08/03/2014, 04/20/2016, 02/12/2018, 03/14/2021   Influenza-Unspecified 03/24/2020   PFIZER(Purple Top)SARS-COV-2 Vaccination 09/07/2019, 09/30/2019, 04/09/2020   Pfizer Covid-19 Vaccine Bivalent Booster 50yrs & up 03/14/2021   Pfizer(Comirnaty)Fall Seasonal Vaccine 12 years and older 04/04/2023   Tdap 07/15/2012, 01/01/2023    Health Maintenance Due  Topic Date Due   Pneumococcal Vaccine 89-62 Years old (1 of 2 - PCV) Never done   Hepatitis B Vaccines (1 of 3 - 19+ 3-dose series) Never done   Zoster Vaccines- Shingrix (1 of 2) Never done   Shingrix vaccine  Had her first one    Mammogram 06/2023 -had to have addn views and it was ok  Dense breasts  Self breast exam-no lumps   Gyn health Pap 05/2022 , colpo, LEEP, had HPV  Sees GYN -had visit in May-thinks she had a pap History of CIN Taking low dose OC  Last period she had was just spotting   May change to HRT  Colon cancer screening -colonoscopy 08/2022   Bone health   Falls-none  Fractures-none  Supplements -mvi  Last vitamin D  Lab Results  Component Value Date   VD25OH 40.8 06/26/2022    Exercise  Does some but no routine  Floor exercises / squats     Mood     01/06/2024    8:12 AM 03/05/2023   10:09 AM 01/01/2023    4:00 PM 10/11/2021    9:57 AM 07/28/2020    8:38 AM  Depression screen PHQ 2/9  Decreased Interest 1 2 1 1  0  Down, Depressed, Hopeless 1 2 1 1  0  PHQ - 2 Score 2 4 2 2  0  Altered sleeping 0 2 0 0 0  Tired, decreased energy 1 0 0 1 1  Change in appetite 0 0 3 0 0  Feeling bad or failure about yourself  0 2 0 2 0  Trouble concentrating 0 2 1 1  0  Moving slowly or fidgety/restless 0 0 0 0 0  Suicidal thoughts 0 0 0 1 0  PHQ-9 Score 3 10 6 7 1   Difficult doing work/chores Not difficult at all  Somewhat difficult Very difficult Somewhat difficult   Zoloft  100 mg daily for depression and PMDD Doing pretty good overall  Some stress at work    History of anemia from menses in past  Lab Results  Component Value Date   WBC 6.0 12/05/2023   HGB 12.7 12/05/2023   HCT 40.6 12/05/2023   MCV 94 12/05/2023   PLT 313 12/05/2023   Lab Results  Component Value Date   IRON  70 12/05/2023   FERRITIN 6.0 (L) 07/21/2020   Not currently taking iron   Hyperlipidemia Lab Results  Component Value Date   CHOL 245 (H) 12/05/2023   CHOL 220 (H) 10/24/2022   CHOL 207 (H) 09/14/2021   Lab Results  Component Value Date   HDL 68 12/05/2023   HDL 68 10/24/2022   HDL 74 09/14/2021   Lab Results  Component Value Date   LDLCALC 150 (H) 12/05/2023   LDLCALC 134 (H) 10/24/2022   LDLCALC 126 (H) 09/14/2021   Lab Results  Component Value Date   TRIG 153 (H) 12/05/2023   TRIG 101 10/24/2022   TRIG 40 09/14/2021   Lab Results  Component Value Date   CHOLHDL 3.6 12/05/2023   CHOLHDL 3.2 10/24/2022   CHOLHDL 2.8 09/14/2021   Lab Results  Component Value Date   LDLDIRECT 141.4 07/10/2013   Does eat a fair amount of high cholesterol foods A lot of traveling lately   The 10-year ASCVD risk score (Arnett DK, et al., 2019) is: 2.4%   Values used to calculate the score:     Age: 89 years     Clincally relevant sex: Female     Is  Non-Hispanic African American: No     Diabetic: Yes     Tobacco smoker: No     Systolic Blood Pressure: 116 mmHg     Is BP treated: No     HDL Cholesterol: 68 mg/dL     Total Cholesterol: 245 mg/dL  Fam history  PGM - MI  Mother has high cholesterol    Prediabetes Lab Results  Component Value Date   HGBA1C 5.4 12/05/2023   HGBA1C 5.6 06/26/2022   HGBA1C 5.7 (H) 09/14/2021     Lab Results  Component Value Date   TSH 1.870 12/05/2023   Lab Results  Component Value Date   NA 139 12/05/2023   K 4.7 12/05/2023   CO2 28 07/21/2020   GLUCOSE 103 (H) 12/05/2023   BUN 9 12/05/2023   CREATININE 0.93 12/05/2023   CALCIUM  9.0 12/05/2023   GFR 86.66 07/21/2020   EGFR 74 12/05/2023   GFRNONAA 73 04/21/2018   Lab Results  Component Value Date   ALT 13 12/05/2023   AST 18 12/05/2023   GGT 10 12/05/2023   ALKPHOS 51 12/05/2023   BILITOT 0.3 12/05/2023     Patient Active Problem List   Diagnosis Date Noted   Fatigue 03/05/2023   Colon cancer screening 04/19/2022   Vitamin D  deficiency 10/11/2021   Mitral valve prolapse 08/06/2020   History of depression 08/06/2020   Screening for STD (sexually transmitted disease) 07/28/2020   Mild hyperlipidemia 07/28/2019   Cervical intraepithelial neoplasia grade III with severe dysplasia 07/18/2018   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   Encounter for annual routine gynecological examination 05/05/2018   Prediabetes 05/05/2018   Iron  deficiency anemia 05/05/2018   Premenstrual dysphoric disorder 02/12/2018   Allergic rhinitis 02/12/2018   External hemorrhoids 02/12/2018   Routine general medical examination at a health care facility 07/15/2012   Depression 04/09/2011   Past Medical History:  Diagnosis Date   Depression    Mitral valve prolapse    Past Surgical History:  Procedure Laterality Date   AUGMENTATION MAMMAPLASTY Bilateral 1998   Implants removed 2021   BREAST CYST ASPIRATION Right 11/02/2020   Social History    Tobacco Use   Smoking status: Never   Smokeless tobacco: Never  Substance Use Topics   Alcohol use: Yes    Alcohol/week: 0.0 standard drinks of alcohol    Comment: occasional  Drug use: Never   Family History  Problem Relation Age of Onset   Prostate cancer Father    Breast cancer Maternal Aunt    Cancer Maternal Aunt 55       breast   Breast cancer Maternal Grandmother    Esophageal cancer Neg Hx    Liver disease Neg Hx    Allergies  Allergen Reactions   Sulfa Antibiotics Shortness Of Breath   Current Outpatient Medications on File Prior to Visit  Medication Sig Dispense Refill   diphenhydrAMINE  (BENADRYL ) 25 mg capsule Take 25 mg by mouth at bedtime as needed for itching.     Multiple Vitamin (MULTIVITAMIN) capsule Take 1 capsule by mouth daily.     sertraline  (ZOLOFT ) 100 MG tablet Take 1 tablet (100 mg total) by mouth daily. 90 tablet 0   VIENVA 0.1-20 MG-MCG tablet TAKE 1 TABLET BY MOUTH EVERY DAY 84 tablet 0   ferrous sulfate  325 (65 FE) MG tablet TAKE 1 TABLET BY MOUTH EVERY DAY (Patient not taking: Reported on 01/06/2024) 90 tablet 3   No current facility-administered medications on file prior to visit.    Review of Systems  Constitutional:  Negative for activity change, appetite change, fatigue, fever and unexpected weight change.  HENT:  Negative for congestion, ear pain, rhinorrhea, sinus pressure and sore throat.   Eyes:  Negative for pain, redness and visual disturbance.  Respiratory:  Negative for cough, shortness of breath and wheezing.   Cardiovascular:  Negative for chest pain and palpitations.  Gastrointestinal:  Negative for abdominal pain, blood in stool, constipation and diarrhea.  Endocrine: Negative for polydipsia and polyuria.  Genitourinary:  Negative for dysuria, frequency and urgency.  Musculoskeletal:  Negative for arthralgias, back pain and myalgias.  Skin:  Negative for pallor and rash.  Allergic/Immunologic: Negative for environmental  allergies.  Neurological:  Negative for dizziness, syncope and headaches.  Hematological:  Negative for adenopathy. Does not bruise/bleed easily.  Psychiatric/Behavioral:  Negative for decreased concentration and dysphoric mood. The patient is not nervous/anxious.        Objective:   Physical Exam Constitutional:      General: She is not in acute distress.    Appearance: Normal appearance. She is well-developed and normal weight. She is not ill-appearing or diaphoretic.  HENT:     Head: Normocephalic and atraumatic.     Right Ear: Tympanic membrane, ear canal and external ear normal.     Left Ear: Tympanic membrane, ear canal and external ear normal.     Nose: Nose normal. No congestion.     Mouth/Throat:     Mouth: Mucous membranes are moist.     Pharynx: Oropharynx is clear. No posterior oropharyngeal erythema.  Eyes:     General: No scleral icterus.    Extraocular Movements: Extraocular movements intact.     Conjunctiva/sclera: Conjunctivae normal.     Pupils: Pupils are equal, round, and reactive to light.  Neck:     Thyroid : No thyromegaly.     Vascular: No carotid bruit or JVD.  Cardiovascular:     Rate and Rhythm: Normal rate and regular rhythm.     Pulses: Normal pulses.     Heart sounds: Normal heart sounds.     No gallop.  Pulmonary:     Effort: Pulmonary effort is normal. No respiratory distress.     Breath sounds: Normal breath sounds. No wheezing.     Comments: Good air exch Chest:     Chest wall: No tenderness.  Abdominal:     General: Bowel sounds are normal. There is no distension or abdominal bruit.     Palpations: Abdomen is soft. There is no mass.     Tenderness: There is no abdominal tenderness.     Hernia: No hernia is present.  Genitourinary:    Comments: Breast and pelvic exam are done by gyn provider   Musculoskeletal:        General: No tenderness. Normal range of motion.     Cervical back: Normal range of motion and neck supple. No rigidity.  No muscular tenderness.     Right lower leg: No edema.     Left lower leg: No edema.     Comments: No kyphosis   Lymphadenopathy:     Cervical: No cervical adenopathy.  Skin:    General: Skin is warm and dry.     Coloration: Skin is not pale.     Findings: No erythema or rash.     Comments: Solar lentigines diffusely   Neurological:     Mental Status: She is alert. Mental status is at baseline.     Cranial Nerves: No cranial nerve deficit.     Motor: No abnormal muscle tone.     Coordination: Coordination normal.     Gait: Gait normal.     Deep Tendon Reflexes: Reflexes are normal and symmetric. Reflexes normal.  Psychiatric:        Mood and Affect: Mood normal.        Cognition and Memory: Cognition and memory normal.           Assessment & Plan:   Problem List Items Addressed This Visit       Other   Vitamin D  deficiency   Urged to continue vitamin D3 2000 international units daily for bone and overall health      Routine general medical examination at a health care facility - Primary   Reviewed health habits including diet and exercise and skin cancer prevention Reviewed appropriate screening tests for age  Also reviewed health mt list, fam hx and immunization status , as well as social and family history   See HPI Labs reviewed and ordered Health Maintenance  Topic Date Due   Pneumococcal Vaccination (1 of 2 - PCV) Never done   Hepatitis B Vaccine (1 of 3 - 19+ 3-dose series) Never done   Zoster (Shingles) Vaccine (1 of 2) Never done   COVID-19 Vaccine (6 - Pfizer risk 2024-25 season) 01/22/2024*   Yearly kidney health urinalysis for diabetes  01/01/2028*   Flu Shot  01/10/2024   Hemoglobin A1C  06/05/2024   Mammogram  07/09/2024   Yearly kidney function blood test for diabetes  12/04/2024   Pap with HPV screening  05/26/2027   Colon Cancer Screening  08/20/2027   DTaP/Tdap/Td vaccine (3 - Td or Tdap) 12/31/2032   Hepatitis C Screening  Completed   HIV  Screening  Completed   HPV Vaccine  Aged Out   Meningitis B Vaccine  Aged Out   Complete foot exam   Discontinued   Eye exam for diabetics  Discontinued  *Topic was postponed. The date shown is not the original due date.    Had first shingrix, planning the 2nd Discussed fall prevention, supplements and exercise for bone density  PHQ 3 - improved with treatment       Premenstrual dysphoric disorder   Doing well with sertraline  Under gyn care Also low dose OC  Prediabetes   Prediabetes  Lab Results  Component Value Date   HGBA1C 5.4 12/05/2023   HGBA1C 5.6 06/26/2022   HGBA1C 5.7 (H) 09/14/2021    disc imp of low glycemic diet and wt loss to prevent DM2        Mild hyperlipidemia   Disc goals for lipids and reasons to control them Rev last labs with pt Rev low sat fat diet in detail LDL 150  Will continue to monitor  Mother has high chol  ASCVD score 2.4%      Iron  deficiency anemia   Better with lighter menses Not taking iron        Depression   Overall doing better  Some job stress Sertraline  100 mg daily      Colon cancer screening   Colonoscopy utd 08/2022

## 2024-01-06 NOTE — Assessment & Plan Note (Signed)
 Overall doing better  Some job stress Sertraline  100 mg daily

## 2024-01-08 ENCOUNTER — Telehealth: Payer: Self-pay

## 2024-01-08 NOTE — Telephone Encounter (Signed)
 Left VM requesting pt to call the office back

## 2024-01-08 NOTE — Telephone Encounter (Signed)
 Copied from CRM [id number]. Topic: Clinical - Medication Question >> Jan 08, 2024 11:46 AM Viola F wrote: Patient wants to up the dosage for the sertraline  (ZOLOFT ) 100 MG tablet - wants to know if it can be 150mg  90 day supply. Please [phone] (H)

## 2024-01-08 NOTE — Telephone Encounter (Signed)
 Thanks for letting me know  What issues/ symptoms is she having or what is worsening ?

## 2024-01-09 MED ORDER — SERTRALINE HCL 100 MG PO TABS: 150.0000 mg | ORAL_TABLET | Freq: Every day | ORAL | 3 refills | Status: AC

## 2024-01-09 NOTE — Addendum Note (Signed)
 Addended by: RANDEEN HARDY A on: 01/09/2024 04:58 PM   Modules accepted: Orders

## 2024-01-09 NOTE — Telephone Encounter (Signed)
 Pt returned my call she has been seeing a therapist through her insurance tele-a-doc (Virtual) appts. She advised them she was having worsening anxiety and irritability and that provider (physiatrist) with their practice advised her to increase her Zoloft  to 1.5 tabs (150 mg) daily. Pt has been on this increase dose for a while and it has helped with both issues. Pt said it is hard to get in touch with the virtual provider and she is almost out of meds. She said she sees them for her therapy but would like PCP to continue prescribing the meds because she is almost out and it's easier to send us  a refill request.   [facility]

## 2024-01-09 NOTE — Telephone Encounter (Signed)
 Thanks  Sent to the pharmacy

## 2024-03-13 ENCOUNTER — Telehealth: Payer: Self-pay | Admitting: Registered Nurse

## 2024-03-13 ENCOUNTER — Encounter: Payer: Self-pay | Admitting: Registered Nurse

## 2024-03-13 DIAGNOSIS — R300 Dysuria: Secondary | ICD-10-CM

## 2024-03-13 MED ORDER — NITROFURANTOIN MONOHYD MACRO 100 MG PO CAPS: 100.0000 mg | ORAL_CAPSULE | Freq: Two times a day (BID) | ORAL | 0 refills | Status: DC

## 2024-03-13 MED ORDER — PHENAZOPYRIDINE HCL 200 MG PO TABS: 200.0000 mg | ORAL_TABLET | Freq: Three times a day (TID) | ORAL | 0 refills | Status: DC

## 2024-03-13 NOTE — Telephone Encounter (Signed)
 Patient with new onset foul odor, frequency and pressure abdomen x 2 days.  Denied fever/chills/n/v/d/back pain/swelling hands/feet, headache, gross hematuria, tea or cola colored urine.  No previous dipstick urinalysis or urine culture on file in epic.  Electronic Rx macrobid 100mg  po BID x 7 days #14 RF0 sent to her pharmacy of choice.  Medications as directed. Patient is also to push fluids and may use Pyridium 200mg  po TID as needed. Hydrate, avoid dehydration increase water intake and avoid caffinated drinks as can worsen bladder spasms and burning. Avoid holding urine void on frequent basis every 4 to 6 hours. If unable to void every 8 hours, tea or cola colored urine, worsening back/abdomen pain follow up for re-evaluation with PCM, urgent care or ER.   See RN Karene next week for dipstick urinalysis.  Call or return to clinic as needed if these symptoms worsen or fail to improve as anticipated. Exitcare handouts on UTI and dysuria  Patient verbalized agreement and understanding of treatment plan and had no further questions at this time.  P2: Hydrate and cranberry juice

## 2024-03-16 ENCOUNTER — Other Ambulatory Visit: Payer: Self-pay | Admitting: Registered Nurse

## 2024-03-16 ENCOUNTER — Ambulatory Visit: Payer: Self-pay | Admitting: General Practice

## 2024-03-16 DIAGNOSIS — R300 Dysuria: Secondary | ICD-10-CM

## 2024-03-16 NOTE — Progress Notes (Signed)
 Employee with clinic visit as a follow up of telephone assessment by NP. States she was called in a prescription for abx and started taking over the weekend. Also states pain is much better and continues taking the abx and following NP instructions.  Per NP orders conveyed to RN, urine dipstick: results  Urine dipstick + bilirubin, Spec Gravity 1.015, Blood ++ Moderate, pH 6.0, Trace Protein, and Trace Leukocytes. Urine cx sent to [facility] per NP orders, due to cloudy, strong and dark yellow colored urine. Will follow up with employee with results of urine Cx.

## 2024-03-16 NOTE — Telephone Encounter (Signed)
 Spoke with RN Karene via telephone at [phone] discussed to contact patient for dipstick urinalysis and if abnormal results send urine for urine culture to [facility] today.  She verbalized understanding and had no further questions at that time.

## 2024-03-18 ENCOUNTER — Ambulatory Visit: Payer: Self-pay | Admitting: Registered Nurse

## 2024-03-18 LAB — URINE CULTURE

## 2024-03-19 NOTE — Progress Notes (Signed)
 Results discussed with patient today.  Discussed family history of kidney stones could cause similar symptoms especially if they are sand sized stones without any ureter obstruction.  When larger stone typically results in back pain/n/v/d/extreme pain/fever/chills.  Continue hydrating with water to keep urine pale yellow clear and voiding every 2-4 hours while awake.  Symptoms have resolved she took antibiotic from [facility] and pyridium from my rx prior to symptoms resolving.  Her pharmacy did not given her macrobid only pyridium when she went to pick up Rx so she called [facility] to get another rx.  Patient instructed to follow up with [facility] or another provider if symptoms return to have urine retested dipstick and culture recommended prior to treatment.  Patient verbalized understanding information/instructions and had no further questions at that time.

## 2024-03-19 NOTE — Telephone Encounter (Signed)
 Results discussed with patient today urine culture negative.  Symptoms have resolved.  See results note.

## 2024-03-25 ENCOUNTER — Ambulatory Visit: Payer: Self-pay | Admitting: General Practice

## 2024-03-25 VITALS — BP 132/95 | HR 96 | Temp 99.0°F | Resp 18

## 2024-03-25 DIAGNOSIS — R051 Acute cough: Secondary | ICD-10-CM | POA: Insufficient documentation

## 2024-03-25 DIAGNOSIS — G4483 Primary cough headache: Secondary | ICD-10-CM | POA: Insufficient documentation

## 2024-03-25 NOTE — Progress Notes (Signed)
 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 Has been rotating Dayquil and Nyquil liquids for the past day, took Dayquil this am at 0800. Reports taking a COVID test at home yesterday with negative results. Denies any chills, feeling feverish, weakness or aches.  See VS. RN assessed and palpated the facial sinuses, above and below the eyes. EE felt some pain below eyes. Provided Tylenol , 325 mg x 2 tablets and Medi-Mucus with 20 mg Dextromethorphan and 400 Guaifenesin, pack of 2 tablets. Instructed EE not to take any further doses of Dayquil, as the Medi-Mucus has some of the same medication in it. Advised to take after 12 noon since last Dayquil dose was 0800. Explained the Guaifenesin should help ease pressure on the sinuses. Encouraged increase of fluid intake and water to stay hydrated. Requested a return to the clinic if s/s worsen or if the nurse is needed.

## 2024-03-26 ENCOUNTER — Telehealth: Payer: Self-pay | Admitting: Registered Nurse

## 2024-03-26 ENCOUNTER — Encounter: Payer: Self-pay | Admitting: Registered Nurse

## 2024-03-26 DIAGNOSIS — J069 Acute upper respiratory infection, unspecified: Secondary | ICD-10-CM

## 2024-03-26 NOTE — Telephone Encounter (Signed)
 See RN Karene note yesterday.  Patient reported at work today and feeling better denied concerns at this time.  A&Ox3 spoke full sentences without difficulty.  No audible congestion/cough/throat clearing noted during 2 minute telephone call.

## 2024-04-09 ENCOUNTER — Telehealth: Payer: Self-pay | Admitting: Registered Nurse

## 2024-04-09 ENCOUNTER — Encounter: Payer: Self-pay | Admitting: Registered Nurse

## 2024-04-09 DIAGNOSIS — J069 Acute upper respiratory infection, unspecified: Secondary | ICD-10-CM

## 2024-04-09 MED ORDER — PSEUDOEPH-BROMPHEN-DM 30-2-10 MG/5ML PO SYRP: 5.0000 mL | ORAL_SOLUTION | Freq: Four times a day (QID) | ORAL | 0 refills | Status: AC | PRN

## 2024-04-15 NOTE — Telephone Encounter (Signed)
 Patient with cough and malaise home covid tests negative.  Denied fever/chills/n/v/d  Has tried OTC cough and cold medicine but not sleeping well.  Has used bromphed DM in the past with good results would like new Rx sent in to pharmacy  Electronic Rx bromphed DM 5ml po q6h prn cough/congestion #240 RF0 sent to her pharmacy of choice.  Discussed with patient could cause drowsiness, insomnia, increased BP/HR, med head.  Patient at work today without difficulty.  Discussed if new or worsening symptoms to return to clinic for re-evaluation especially if having fever/chills/n/v/d.  Discussed viral URI is circulating in community at this time.  Recommended nasal saline 2 sprays each nostril q2h prn congestion/thick mucous; fluticasone  nasal 1 spray each nostril BID and if taking oral antihistamine e.g. claritin/zyrtec blair and taking 4 doses of bromphed DM per day to stop the oral antihistamine as bromphed DM has antihistamine in it also along with cough suppressant and sudafed that requires identification at pharmacy.  Patient A&Ox3 spoke full sentences without difficulty audible nasal congestion  no cough during 2 minute call.  Patient verbalized understanding information/instructions, agreed with plan of care and had no further questions at that time.  Late entry.

## 2024-04-28 ENCOUNTER — Ambulatory Visit: Payer: Self-pay | Admitting: Registered Nurse

## 2024-04-28 ENCOUNTER — Encounter: Payer: Self-pay | Admitting: Registered Nurse

## 2024-04-28 ENCOUNTER — Other Ambulatory Visit: Payer: Self-pay | Admitting: General Practice

## 2024-04-28 ENCOUNTER — Ambulatory Visit: Payer: Self-pay | Admitting: General Practice

## 2024-04-28 VITALS — BP 124/86 | HR 88 | Resp 18

## 2024-04-28 DIAGNOSIS — R42 Dizziness and giddiness: Secondary | ICD-10-CM

## 2024-04-28 DIAGNOSIS — J069 Acute upper respiratory infection, unspecified: Secondary | ICD-10-CM

## 2024-04-28 DIAGNOSIS — Z23 Encounter for immunization: Secondary | ICD-10-CM

## 2024-04-28 DIAGNOSIS — H6691 Otitis media, unspecified, right ear: Secondary | ICD-10-CM

## 2024-04-28 DIAGNOSIS — R809 Proteinuria, unspecified: Secondary | ICD-10-CM

## 2024-04-28 DIAGNOSIS — H6993 Unspecified Eustachian tube disorder, bilateral: Secondary | ICD-10-CM

## 2024-04-28 MED ORDER — AMOXICILLIN-POT CLAVULANATE 875-125 MG PO TABS: 1.0000 | ORAL_TABLET | Freq: Two times a day (BID) | ORAL | 0 refills | Status: DC

## 2024-04-28 MED ORDER — AMOXICILLIN-POT CLAVULANATE 875-125 MG PO TABS: 1.0000 | ORAL_TABLET | Freq: Two times a day (BID) | ORAL | 0 refills | Status: AC

## 2024-04-28 MED ORDER — FLUTICASONE PROPIONATE 50 MCG/ACT NA SUSP
1.0000 | Freq: Two times a day (BID) | NASAL | 6 refills | Status: AC
Start: 2024-04-28 — End: 2024-06-27

## 2024-04-28 NOTE — Patient Instructions (Addendum)
 Dizziness most likely related to fluid in ears and right ear infection  Dizziness is a common problem. It makes you feel unsteady or light-headed. You may feel like you're about to faint. Dizziness can lead to getting hurt if you stumble or fall. It's more common to feel dizzy if you're an older adult. Many things can cause you to feel dizzy. These include: Medicines. Dehydration. This is when there's not enough water in your body. Illness. Follow these instructions at home: Eating and drinking  Drink enough fluid to keep your pee (urine) pale yellow. This helps keep you from getting dehydrated. Try to drink more clear fluids, such as water. Do not drink alcohol. Try to limit how much caffeine you take in. Try to limit how much salt, also called sodium, you take in. Activity Try not to make quick movements. Stand up slowly from sitting in a chair. Steady yourself until you feel okay. In the morning, first sit up on the side of the bed. When you feel okay, hold onto something and slowly stand up. Do this until you know that your balance is okay. If you need to stand in one place for a long time, move your legs often. Tighten and relax the muscles in your legs while you're standing. Do not drive or use machines if you feel dizzy. Avoid bending down if you feel dizzy. Place items in your home so you can reach them without leaning over. Lifestyle Do not smoke, vape, or use products with nicotine or tobacco in them. If you need help quitting, talk with your health care provider. Try to lower your stress level. You can do this by using methods like yoga or meditation. Talk with your provider if you need help. General instructions Watch your dizziness for any changes. Take your medicines only as told by your provider. Talk with your provider if you think you're dizzy because of a medicine you're taking. Tell a friend or a family member that you're feeling dizzy. If they spot any changes in your  behavior, have them call your provider. Contact a health care provider if: Your dizziness doesn't go away, or you have new symptoms. Your dizziness gets worse. You feel like you may vomit. You have trouble hearing. You have a fever. You have neck pain or a stiff neck. You fall or get hurt. Get help right away if: You vomit each time you eat or drink. You have watery poop and can't eat or drink. You have trouble talking, walking, swallowing, or using your arms, hands, or legs. You feel very weak. You're bleeding. You're not thinking clearly, or you have trouble forming sentences. A friend or family member may spot this. Your vision changes, or you get a very bad headache. These symptoms may be an emergency. Call 911 right away. Do not wait to see if the symptoms will go away. Do not drive yourself to the hospital. This information is not intended to replace advice given to you by your health care provider. Make sure you discuss any questions you have with your health care provider. Document Revised: 02/28/2023 Document Reviewed: 07/12/2022 Elsevier Patient Education  2024 [employer].Eustachian Tube Dysfunction  Eustachian tube dysfunction refers to a condition in which a blockage develops in the narrow passage that connects the middle ear to the back of the nose (eustachian tube). The eustachian tube regulates air pressure in the middle ear by letting air move between the ear and nose. It also helps to drain fluid from  the middle ear space. Eustachian tube dysfunction can affect one or both ears. When the eustachian tube does not function properly, air pressure, fluid, or both can build up in the middle ear. What are the causes? This condition occurs when the eustachian tube becomes blocked or cannot open normally. Common causes of this condition include: Ear infections. Colds and other infections that affect the nose, mouth, and throat (upper respiratory  tract). Allergies. Irritation from cigarette smoke. Irritation from stomach acid coming up into the esophagus (gastroesophageal reflux). The esophagus is the part of the body that moves food from the mouth to the stomach. Sudden changes in air pressure, such as from descending in an airplane or scuba diving. Abnormal growths in the nose or throat, such as: Growths that line the nose (nasal polyps). Abnormal growth of cells (tumors). Enlarged tissue at the back of the throat (adenoids). What increases the risk? You are more likely to develop this condition if: You smoke. You are overweight. You are a child who has: Certain birth defects of the mouth, such as cleft palate. Large tonsils or adenoids. What are the signs or symptoms? Common symptoms of this condition include: A feeling of fullness in the ear. Ear pain. Clicking or popping noises in the ear. Ringing in the ear (tinnitus). Hearing loss. Loss of balance. Dizziness. Symptoms may get worse when the air pressure around you changes, such as when you travel to an area of high elevation, fly on an airplane, or go scuba diving. How is this diagnosed? This condition may be diagnosed based on: Your symptoms. A physical exam of your ears, nose, and throat. Tests, such as those that measure: The movement of your eardrum. Your hearing (audiometry). How is this treated? Treatment depends on the cause and severity of your condition. In mild cases, you may relieve your symptoms by moving air into your ears. This is called popping the ears. In more severe cases, or if you have symptoms of fluid in your ears, treatment may include: Medicines to relieve congestion (decongestants). Medicines that treat allergies (antihistamines). Nasal sprays or ear drops that contain medicines that reduce swelling (steroids). A procedure to drain the fluid in your eardrum. In this procedure, a small tube may be placed in the eardrum to: Drain the  fluid. Restore the air in the middle ear space. A procedure to insert a balloon device through the nose to inflate the opening of the eustachian tube (balloon dilation). Follow these instructions at home: Lifestyle Do not do any of the following until your health care provider approves: Travel to high altitudes. Fly in airplanes. Work in a estate agent or room. Scuba dive. Do not use any products that contain nicotine or tobacco. These products include cigarettes, chewing tobacco, and vaping devices, such as e-cigarettes. If you need help quitting, ask your health care provider. Keep your ears dry. Wear fitted earplugs during showering and bathing. Dry your ears completely after. General instructions Take over-the-counter and prescription medicines only as told by your health care provider. Use techniques to help pop your ears as recommended by your health care provider. These may include: Chewing gum. Yawning. Frequent, forceful swallowing. Closing your mouth, holding your nose closed, and gently blowing as if you are trying to blow air out of your nose. Keep all follow-up visits. This is important. Contact a health care provider if: Your symptoms do not go away after treatment. Your symptoms come back after treatment. You are unable to pop your ears. You have:  A fever. Pain in your ear. Pain in your head or neck. Fluid draining from your ear. Your hearing suddenly changes. You become very dizzy. You lose your balance. Get help right away if: You have a sudden, severe increase in any of your symptoms. Summary Eustachian tube dysfunction refers to a condition in which a blockage develops in the eustachian tube. It can be caused by ear infections, allergies, inhaled irritants, or abnormal growths in the nose or throat. Symptoms may include ear pain or fullness, hearing loss, or ringing in the ears. Mild cases are treated with techniques to unblock the ears, such as yawning  or chewing gum. More severe cases are treated with medicines or procedures. This information is not intended to replace advice given to you by your health care provider. Make sure you discuss any questions you have with your health care provider. Document Revised: 08/08/2020 Document Reviewed: 08/08/2020 Elsevier Patient Education  2024 [employer] Viral Respiratory Infection A respiratory infection is an illness that affects part of the respiratory system, such as the lungs, nose, or throat. A respiratory infection that is caused by a virus is called a viral respiratory infection. Common types of viral respiratory infections include: A cold. The flu (influenza). A respiratory syncytial virus (RSV) infection. What are the causes? This condition is caused by a virus. The virus may spread through contact with droplets or direct contact with infected people or their mucus or secretions. The virus may spread from person to person (is contagious). What are the signs or symptoms? Symptoms of this condition include: A stuffy or runny nose. A sore throat or cough. Shortness of breath or difficulty breathing. Yellow or green mucus (sputum). Other symptoms may include: A fever. Sweating or chills. Fatigue. Achy muscles. A headache. How is this diagnosed? This condition may be diagnosed based on: Your symptoms. A physical exam. Testing of secretions from the nose or throat. Chest X-ray. How is this treated? This condition may be treated with medicines, such as: Antiviral medicine. This may shorten the length of time a person has symptoms. Expectorants. These make it easier to cough up mucus. Decongestant nasal sprays. Acetaminophen  or NSAIDs, such as ibuprofen , to relieve fever and pain. Antibiotic medicines are not prescribed for viral infections.This is because antibiotics are designed to kill bacteria. They do not kill viruses. Follow these instructions at home: Managing pain and  congestion Take over-the-counter and prescription medicines only as told by your health care provider. If you have a sore throat, gargle with a mixture of salt and water 3-4 times a day or as needed. To make salt water, completely dissolve -1 tsp (3-6 g) of salt in 1 cup (237 mL) of warm water. Use nose drops made from salt water to ease congestion and soften raw skin around your nose. Take 2 tsp (10 mL) of honey at bedtime to lessen coughing at night. Do not give honey to children who are younger than 1 year. Drink enough fluid to keep your urine pale yellow. This helps prevent dehydration and helps loosen up mucus. General instructions  Rest as much as possible. Do not drink alcohol. Do not use any products that contain nicotine or tobacco. These products include cigarettes, chewing tobacco, and vaping devices, such as e-cigarettes. If you need help quitting, ask your health care provider. Keep all follow-up visits. This is important. How is this prevented?     Get an annual flu shot. You may get the flu shot in late summer, fall, or  winter. Ask your health care provider when you should get your flu shot. Avoid spreading your infection to other people. If you are sick: Wash your hands with soap and water often, especially after you cough or sneeze. Wash for at least 20 seconds. If soap and water are not available, use alcohol-based hand sanitizer. Cover your mouth when you cough. Cover your nose and mouth when you sneeze. Do not share cups or eating utensils. Clean commonly used objects often. Clean commonly touched surfaces. Stay home from work or school as told by your health care provider. Avoid contact with people who are sick during cold and flu season. This is generally fall and winter. Contact a health care provider if: Your symptoms last for 10 days or longer. Your symptoms get worse over time. You have severe sinus pain in your face or forehead. The glands in your jaw or neck  become very swollen. You have shortness of breath. Get help right away if you: Feel pain or pressure in your chest. Have trouble breathing. Faint or feel like you will faint. Have severe and persistent vomiting. Feel confused or disoriented. These symptoms may represent a serious problem that is an emergency. Do not wait to see if the symptoms will go away. Get medical help right away. Call your local emergency services (911 in the [place] Do not drive yourself to the hospital. Summary A respiratory infection is an illness that affects part of the respiratory system, such as the lungs, nose, or throat. A respiratory infection that is caused by a virus is called a viral respiratory infection. Common types of viral respiratory infections include a cold, influenza, and respiratory syncytial virus (RSV) infection. Symptoms of this condition include a stuffy or runny nose, cough, fatigue, achy muscles, sore throat, and fevers or chills. Antibiotic medicines are not prescribed for viral infections. This is because antibiotics are designed to kill bacteria. They are not effective against viruses. This information is not intended to replace advice given to you by your health care provider. Make sure you discuss any questions you have with your health care provider. Document Revised: 09/01/2020 Document Reviewed: 09/01/2020 Elsevier Patient Education  2024 [employer].Otitis Media, Adult  Otitis media occurs when there is inflammation and fluid in the middle ear with signs and symptoms of an acute infection. The middle ear is a part of the ear that contains bones for hearing as well as air that helps send sounds to the brain. When infected fluid builds up in this space, it causes pressure and can lead to an ear infection. The eustachian tube connects the middle ear to the back of the nose (nasopharynx) and normally allows air into the middle ear. If the eustachian tube becomes blocked, fluid can build up  and become infected. What are the causes? This condition is caused by a blockage in the eustachian tube. This can be caused by mucus or by swelling of the tube. Problems that can cause a blockage include: A cold or other upper respiratory infection. Allergies. An irritant, such as tobacco smoke. Enlarged adenoids. The adenoids are areas of soft tissue located high in the back of the throat, behind the nose and the roof of the mouth. They are part of the body's defense system (immune system). A mass in the nasopharynx. Damage to the ear caused by pressure changes (barotrauma). What increases the risk? You are more likely to develop this condition if you: Smoke or are exposed to tobacco smoke. Have an opening in  the roof of your mouth (cleft palate). Have gastroesophageal reflux. Have an immune system disorder. What are the signs or symptoms? Symptoms of this condition include: Ear pain. Fever. Decreased hearing. Tiredness (lethargy). Fluid leaking from the ear, if the eardrum is ruptured or has burst. Ringing in the ear. How is this diagnosed?  This condition is diagnosed with a physical exam. During the exam, your health care provider will use an instrument called an otoscope to look in your ear and check for redness, swelling, and fluid. He or she will also ask about your symptoms. Your health care provider may also order tests, such as: A pneumatic otoscopy. This is a test to check the movement of the eardrum. It is done by squeezing a small amount of air into the ear. A tympanogram. This is a test that shows how well the eardrum moves in response to air pressure in the ear canal. It provides a graph for your health care provider to review. How is this treated? This condition can go away on its own within 3-5 days. But if the condition is caused by a bacterial infection and does not go away on its own, or if it keeps coming back, your health care provider may: Prescribe antibiotic  medicine to treat the infection. Prescribe or recommend medicines to control pain. Follow these instructions at home: Take over-the-counter and prescription medicines only as told by your health care provider. If you were prescribed an antibiotic medicine, take it as told by your health care provider. Do not stop taking the antibiotic even if you start to feel better. Keep all follow-up visits. This is important. Contact a health care provider if: You have bleeding from your nose. There is a lump on your neck. You are not feeling better in 5 days. You feel worse instead of better. Get help right away if: You have severe pain that is not controlled with medicine. You have swelling, redness, or pain around your ear. You have stiffness in your neck. A part of your face is not moving (paralyzed). The bone behind your ear (mastoid bone) is tender when you touch it. You develop a severe headache. Summary Otitis media is redness, soreness, and swelling of the middle ear, usually resulting in pain and decreased hearing. This condition can go away on its own within 3-5 days. If the problem does not go away in 3-5 days, your health care provider may give you medicines to treat the infection. If you were prescribed an antibiotic medicine, take it as told by your health care provider. Follow all instructions that were given to you by your health care provider. This information is not intended to replace advice given to you by your health care provider. Make sure you discuss any questions you have with your health care provider. Document Revised: 09/05/2020 Document Reviewed: 09/05/2020 Elsevier Patient Education  2024 [employer] Inc.Influenza (Flu) Vaccine (Inactivated or Recombinant): What You Need to Know Many vaccine information statements are available in Spanish and other languages. See promoage.com.br. 1. Why get vaccinated? Influenza vaccine can prevent influenza (flu). Flu is a  contagious disease that spreads around the [place]  every year, usually between October and May. Anyone can get the flu, but it is more dangerous for some people. Infants and young children, people 14 years and older, pregnant people, and people with certain health conditions or a weakened immune system are at greatest risk of flu complications. Pneumonia, bronchitis, sinus infections, and ear infections are examples of flu-related complications.  If you have a medical condition, such as heart disease, cancer, or diabetes, flu can make it worse. Flu can cause fever and chills, sore throat, muscle aches, fatigue, cough, headache, and runny or stuffy nose. Some people may have vomiting and diarrhea, though this is more common in children than adults. In an average year, thousands of people in the [place]  die from flu, and many more are hospitalized. Flu vaccine prevents millions of illnesses and flu-related visits to the doctor each year. 2. Influenza vaccines CDC recommends everyone 6 months and older get vaccinated every flu season. Children 6 months through 41 years of age may need 2 doses during a single flu season. Everyone else needs only 1 dose each flu season. It takes about 2 weeks for protection to develop after vaccination. There are many flu viruses, and they are always changing. Each year a new flu vaccine is made to protect against the influenza viruses believed to be likely to cause disease in the upcoming flu season. Even when the vaccine doesn't exactly match these viruses, it may still provide some protection. Influenza vaccine does not cause flu. Influenza vaccine may be given at the same time as other vaccines. 3. Talk with your health care provider Tell your vaccination provider if the person getting the vaccine: Has had an allergic reaction after a previous dose of influenza vaccine, or has any severe, life-threatening allergies Has ever had Guillain-Barr Syndrome (also  called GBS) In some cases, your health care provider may decide to postpone influenza vaccination until a future visit. Influenza vaccine can be administered at any time during pregnancy. People who are or will be pregnant during influenza season should receive inactivated influenza vaccine. People with minor illnesses, such as a cold, may be vaccinated. People who are moderately or severely ill should usually wait until they recover before getting influenza vaccine. Your health care provider can give you more information. 4. Risks of a vaccine reaction Soreness, redness, and swelling where the shot is given, fever, muscle aches, and headache can happen after influenza vaccination. There may be a very small increased risk of Guillain-Barr Syndrome (GBS) after inactivated influenza vaccine (the flu shot). Young children who get the flu shot along with pneumococcal vaccine (PCV13) and/or DTaP vaccine at the same time might be slightly more likely to have a seizure caused by fever. Tell your health care provider if a child who is getting flu vaccine has ever had a seizure. People sometimes faint after medical procedures, including vaccination. Tell your provider if you feel dizzy or have vision changes or ringing in the ears. As with any medicine, there is a very remote chance of a vaccine causing a severe allergic reaction, other serious injury, or death. 5. What if there is a serious problem? An allergic reaction could occur after the vaccinated person leaves the clinic. If you see signs of a severe allergic reaction (hives, swelling of the face and throat, difficulty breathing, a fast heartbeat, dizziness, or weakness), call 9-1-1 and get the person to the nearest hospital. For other signs that concern you, call your health care provider. Adverse reactions should be reported to the Vaccine Adverse Event Reporting System (VAERS). Your health care provider will usually file this report, or you can do  it yourself. Visit the VAERS website at www.vaers.lagents.no or call 931 614 3938. VAERS is only for reporting reactions, and VAERS staff members do not give medical advice. 6. The National Vaccine Injury Compensation Program The [employer] Vaccine Injury Compensation Program (  VICP) is a federal program that was created to compensate people who may have been injured by certain vaccines. Claims regarding alleged injury or death due to vaccination have a time limit for filing, which may be as short as two years. Visit the VICP website at spiritualword.at or call 9517617365 to learn about the program and about filing a claim. 7. How can I learn more? Ask your health care provider. Call your local or state health department. Visit the website of the Food and Drug Administration (FDA) for vaccine package inserts and additional information at finderlist.no. Contact the Centers for Disease Control and Prevention (CDC): Call (302) 566-6156 (1-800-CDC-INFO) or Visit CDC's website at biotechroom.com.cy. Source: CDC Vaccine Information Statement Inactivated Influenza Vaccine (01/15/2020) This same material is available at footballexhibition.com.br for no charge. This information is not intended to replace advice given to you by your health care provider. Make sure you discuss any questions you have with your health care provider. Document Revised: 09/12/2022 Document Reviewed: 06/18/2022 Elsevier Patient Education  2024 Else[employer] Inc.How to Perform a Sinus Rinse A sinus rinse is a home treatment that is used to rinse your sinuses with a germ-free (sterile) mixture of salt and water (saline solution). Sinuses are air-filled spaces in your skull that are behind the bones of your face and forehead. They open into your nasal cavity. A sinus rinse can help to clear mucus, dirt, dust, or pollen from your nasal cavity. You may do a sinus rinse when you have a cold, a virus, nasal allergy  symptoms, a sinus infection, or stuffiness in your nose or sinuses. What are the risks? A sinus rinse is generally safe and effective. However, there are a few risks, which include: A burning sensation in your sinuses. This may happen if you do not make the saline solution as directed. Be sure to follow all directions when making the saline solution. Nasal irritation. Infection. This may be from unclean supplies or from contaminated water. Infection from contaminated water is rare, but possible. Do not do a sinus rinse if you have had ear or nasal surgery, ear infection, or plugged ears, unless recommended by your health care provider. Supplies needed: Saline solution or powder. Distilled or sterile water to mix with saline powder. You may use boiled and cooled tap water. Boil tap water for 5 minutes; cool until it is lukewarm. Use within 24 hours. Do not use regular tap water to mix with the saline solution. Neti pot or nasal rinse bottle. These supplies release the saline solution into your nose and through your sinuses. Neti pots and nasal rinse bottles can be purchased at charity fundraiser, a health food store, or online. How to perform a sinus rinse  Wash your hands with soap and water for at least 20 seconds. If soap and water are not available, use hand sanitizer. Wash your device according to the directions that came with the product and then dry it. Use the solution that comes with your product or one that is sold separately in stores. Follow the mixing directions on the package to mix with sterile or distilled water. Fill the device with the amount of saline solution noted in the device instructions. Stand by a sink and tilt your head sideways over the sink. Place the spout of the device in your upper nostril (the one closer to the ceiling). Gently pour or squeeze the saline solution into your nasal cavity. The liquid should drain out from the lower nostril if you are not too  congested. While rinsing, breathe through your open mouth. Gently blow your nose to clear any mucus and rinse solution. Blowing too hard may cause ear pain. Turn your head in the other direction and repeat in your other nostril. Clean and rinse your device with clean water and then air-dry it. Talk with your health care provider or pharmacist if you have questions about how to do a sinus rinse. Summary A sinus rinse is a home treatment that is used to rinse your sinuses with a sterile mixture of salt and water (saline solution). You may do a sinus rinse when you have a cold, a virus, nasal allergy symptoms, a sinus infection, or stuffiness in your nose or sinuses. A sinus rinse is generally safe and effective. Follow all instructions carefully. This information is not intended to replace advice given to you by your health care provider. Make sure you discuss any questions you have with your health care provider. Document Revised: 11/14/2020 Document Reviewed: 11/14/2020 Elsevier Patient Education  2024 [employer].

## 2024-04-28 NOTE — Progress Notes (Addendum)
 Established Patient Office Visit  Subjective   Patient ID: Kara [name], female    DOB: 1970/11/15  Age: 53 y.o. MRN: [id number]  Chief Complaint  Patient presents with   Otitis Media   URI   NP visit    53y/o established caucasian female reported  still having coughing, congestion, and headaches, sore throat with husky voice. I now have new symptoms dizziness, and mainly my right ear (sometimes my left) feeling clogged for extended periods and opening back up when I swallow occasionally.  Only using bromphed DM at bedtime due to drowsiness and it helps to sleep.  Needs refill on flonase  nasal sent to her pharmacy and ran out of nasal saline.  She would also like to give urine sample today for the repeat testing dipstick to check for blood/protein in urine.  Denied fever/chills/n/v/d/body aches.      Review of Systems  Constitutional:  Negative for chills and fever.  HENT:  Positive for congestion, ear pain and sore throat. Negative for ear discharge.   Eyes:  Negative for pain, discharge and redness.  Respiratory:  Positive for cough. Negative for shortness of breath and wheezing.   Cardiovascular:  Negative for chest pain.  Gastrointestinal:  Negative for nausea and vomiting.  Genitourinary:  Negative for dysuria, flank pain and hematuria.  Musculoskeletal:  Negative for myalgias and neck pain.  Skin:  Negative for rash.  Neurological:  Positive for dizziness and headaches.  Psychiatric/Behavioral:  The patient does not have insomnia.       Objective:     BP 124/86 (BP Location: Left Arm, Patient Position: Sitting, Cuff Size: Normal)   Pulse 88   Resp 18   SpO2 99%    Physical Exam Vitals and nursing note reviewed.  Constitutional:      General: She is awake. She is not in acute distress.    Appearance: Normal appearance. She is well-developed, well-groomed and normal weight. She is not ill-appearing, toxic-appearing or diaphoretic.  HENT:     Head: Normocephalic  and atraumatic.     Jaw: There is normal jaw occlusion.     Salivary Glands: Right salivary gland is not diffusely enlarged or tender. Left salivary gland is not diffusely enlarged or tender.     Right Ear: Hearing and external ear normal. No decreased hearing noted. Tenderness present. No laceration, drainage or swelling. A middle ear effusion is present. There is no impacted cerumen. No foreign body. No mastoid tenderness. No PE tube. No hemotympanum. Tympanic membrane is injected, erythematous and bulging. Tympanic membrane is not scarred, perforated or retracted.     Left Ear: Hearing and external ear normal. No decreased hearing noted. No laceration, drainage, swelling or tenderness. A middle ear effusion is present. There is no impacted cerumen. No foreign body. No mastoid tenderness. No PE tube. No hemotympanum. Tympanic membrane is bulging. Tympanic membrane is not injected, scarred, perforated, erythematous or retracted.     Ears:     Comments: Bilateral TMs intact air fluid level clear left; 25% opacity right and injection/inflamed vasculature 25% scattered over TM; no debris bilateral auditory canals; right tenderness for otoscopic exam    Nose: Nose normal. No congestion or rhinorrhea.     Right Nostril: No epistaxis.     Left Nostril: No epistaxis.     Right Turbinates: Enlarged and swollen. Not pale.     Left Turbinates: Swollen. Not enlarged or pale.     Right Sinus: No maxillary sinus tenderness or frontal  sinus tenderness.     Left Sinus: No maxillary sinus tenderness or frontal sinus tenderness.     Comments: Bilateral nasal turbinates edema/erythema clear discharge; cobblestoning posterior pharynx; bilateral allergic shiners; nasal sniffing and congestion audible in exam room    Mouth/Throat:     Lips: Pink. No lesions.     Mouth: Mucous membranes are moist. No oral lesions or angioedema.     Dentition: No gum lesions.     Tongue: No lesions. Tongue does not deviate from  midline.     Palate: No mass and lesions.     Pharynx: Uvula midline. Pharyngeal swelling and postnasal drip present. No oropharyngeal exudate, posterior oropharyngeal erythema or uvula swelling.     Tonsils: No tonsillar exudate or tonsillar abscesses.  Eyes:     General: Lids are normal. Vision grossly intact. Gaze aligned appropriately. Allergic shiner present. No scleral icterus.       Right eye: No discharge.        Left eye: No discharge.     Extraocular Movements: Extraocular movements intact.     Conjunctiva/sclera: Conjunctivae normal.     Pupils: Pupils are equal, round, and reactive to light.  Neck:     Trachea: Trachea and phonation normal.  Cardiovascular:     Rate and Rhythm: Normal rate and regular rhythm.     Pulses: Normal pulses.          Radial pulses are 2+ on the right side and 2+ on the left side.     Heart sounds: Normal heart sounds, S1 normal and S2 normal.  Pulmonary:     Effort: Pulmonary effort is normal. No respiratory distress.     Breath sounds: Normal breath sounds and air entry. No stridor or transmitted upper airway sounds. No decreased breath sounds, wheezing, rhonchi or rales.     Comments: Spoke full sentences without difficulty; no cough observed in exam room Abdominal:     General: Abdomen is flat.  Musculoskeletal:        General: Normal range of motion.     Right hand: Normal strength. Normal capillary refill.     Left hand: Normal strength. Normal capillary refill.     Cervical back: Normal range of motion and neck supple. No swelling, edema, deformity, erythema, signs of trauma, lacerations, rigidity, spasms, torticollis, tenderness or crepitus. No pain with movement. Normal range of motion.     Thoracic back: No swelling, edema, deformity, signs of trauma, lacerations, spasms or tenderness. Normal range of motion.     Right lower leg: No edema.     Left lower leg: No edema.  Lymphadenopathy:     Head:     Right side of head: No submental,  submandibular, tonsillar, preauricular, posterior auricular or occipital adenopathy.     Left side of head: No submental, submandibular, tonsillar, preauricular, posterior auricular or occipital adenopathy.     Cervical: No cervical adenopathy.     Right cervical: No superficial, deep or posterior cervical adenopathy.    Left cervical: No superficial, deep or posterior cervical adenopathy.  Skin:    General: Skin is warm and dry.     Capillary Refill: Capillary refill takes less than 2 seconds.     Coloration: Skin is not ashen, cyanotic, jaundiced, mottled, pale or sallow.     Findings: No abrasion, abscess, acne, bruising, burn, ecchymosis, erythema, signs of injury, laceration, lesion, petechiae, rash or wound.     Nails: There is no clubbing.  Comments: Anterior face/neck/hands visually inspected  Neurological:     General: No focal deficit present.     Mental Status: She is alert and oriented to person, place, and time. Mental status is at baseline.     GCS: GCS eye subscore is 4. GCS verbal subscore is 5. GCS motor subscore is 6.     Cranial Nerves: Cranial nerves 2-12 are intact. No cranial nerve deficit, dysarthria or facial asymmetry.     Sensory: Sensation is intact.     Motor: Motor function is intact. No weakness, tremor, atrophy, abnormal muscle tone or seizure activity.     Coordination: Coordination is intact. Coordination normal.     Gait: Gait is intact. Gait normal.     Comments: In/out of chair without difficulty; gait sure and steady in clinic; bilateral hand grasp equal 5/5  Psychiatric:        Attention and Perception: Attention and perception normal.        Mood and Affect: Mood and affect normal.        Speech: Speech normal.        Behavior: Behavior normal. Behavior is cooperative.        Thought Content: Thought content normal.        Cognition and Memory: Cognition and memory normal.        Judgment: Judgment normal.      No results found for any  visits on 04/28/24.    The 10-year ASCVD risk score (Arnett DK, et al., 2019) is: 2.7%    Assessment & Plan:   Problem List Items Addressed This Visit   None Visit Diagnoses       Viral URI    -  Primary     Need for immunization against influenza       Relevant Orders   Flu vaccine trivalent PF, 6mos and older(Fluzone,Afluria,Fluarix,Flulaval) (Completed)     Acute right otitis media       Relevant Medications   amoxicillin-clavulanate (AUGMENTIN) 875-125 MG tablet     Proteinuria, unspecified type       Relevant Orders   POCT Urinalysis Dipstick (CPT 81002)     Eustachian tube dysfunction, bilateral         Dizziness         VSS dizziness most likely related to right otitis media and bilateral eustachian tube dysfunction.  Exitcare handout on dizziness  Patient with history of bilirubin/blood and protein on dipstick urinalysis and dysuria.  Dysuria improved would like to check to ensure blood and protein resolved also after UTI treatment.  Patient to bring Geronda RN urine sample when able M-Th 8a-5p given cup.  Continue hydrating with water avoiding dehydration as mother with history of kidney stones.  Patient had delayed influenza vaccination due to illness last month.  Would like to receive today.  RN Karene administered see immunization tab.  Influenza vaccine VIS printed copy given to patient tolerated well departed ambulatory in no distress and bandaid clean dry and intact.  Respirations even and unlabored RA skin warm dry and pink.  Mild right otitis medica.  Dispensed augmentin 875mg  po BID x 10 days #20 RF0 given to patient from [facility].  Discussed may hold augmentin or start if having discharge/fever/chills.  Tylenol  1000mg  po QID prn pain/fever given 2 UD from clinic stock   No evidence of invasive bacterial infection, non toxic and well hydrated.  This is most likely self limiting viral infection.  I do not see where any further  testing or imaging is necessary at this  time.   I will suggest supportive care, rest, good hygiene and encourage the patient to take adequate fluids.  The patient is to return to clinic or EMERGENCY ROOM if symptoms worsen or change significantly e.g. ear pain, fever, purulent discharge from ears or bleeding.  Exitcare handout on otitis media   Patient verbalized agreement and understanding of treatment plan.      Restart flonase  1 spray each nostril BID #16gm RF6 electronic Rx sent to her pharmacy of choice, saline 2 sprays each nostril q2h wa prn congestion. Sinuses not TTP today cobblestoning posterior pharynx and mild audible nasal congestion/sniffing noted in clinic.    If no improvement with 48 hours of saline and flonase  use start augmentin 875mg  po BID x 10 days #20 RF0 dispensed from [facility] to patient  Denied personal or family history of ENT cancer.  Shower BID especially prior to bed. No evidence of systemic bacterial infection, non toxic and well hydrated.  I do not see where any further testing or imaging is necessary at this time.   I will suggest supportive care, rest, good hygiene and encourage the patient to take adequate fluids.  The patient is to return to clinic or EMERGENCY ROOM if symptoms worsen or change significantly.  Exitcare handout on viral URI and sinus rinse.  Patient verbalized agreement and understanding of treatment plan and had no further questions at this time.   P2:  Hand washing and cover cough    No evidence of invasive bacterial infection, non toxic and well hydrated.  I do not see where any further testing or imaging is necessary at this time.   I will suggest supportive care, rest, good hygiene and encourage the patient to take adequate fluids.  The patient is to return to clinic or EMERGENCY ROOM if symptoms worsen or change significantly e.g. ear pain, fever, purulent discharge from ears or bleeding.  Exitcare handout on otitis media and eustachian tube dysfunction printed and given to patient.  Discussed  with patient post nasal drip irritates throat/causes swelling blocks eustachian tubes from draining and fluid fills up middle ear.  Bacteria/viruses can grow in fluid and with moving head tube compressed and increases pressure in tube/ear worsening pain.  Studies show will take 30 days for fluid to resolve after post nasal drip controlled with nasal steroid/antihistamine. Antibiotics and steroids do not speed up fluid removal.  Patient verbalized agreement and understanding of treatment plan and had no further questions at this time.  Return if symptoms worsen or fail to improve.    Ellouise DELENA Hope, NP

## 2024-04-29 NOTE — Telephone Encounter (Signed)
 Patient seen in clinic 04/28/24 see office note

## 2024-05-19 ENCOUNTER — Encounter: Payer: Self-pay | Admitting: Registered Nurse

## 2024-05-19 ENCOUNTER — Ambulatory Visit: Payer: Self-pay | Admitting: Registered Nurse

## 2024-05-19 VITALS — HR 64 | Temp 98.9°F | Resp 16

## 2024-05-19 DIAGNOSIS — R051 Acute cough: Secondary | ICD-10-CM

## 2024-05-19 DIAGNOSIS — Z20828 Contact with and (suspected) exposure to other viral communicable diseases: Secondary | ICD-10-CM

## 2024-05-19 MED ORDER — OSELTAMIVIR PHOSPHATE 75 MG PO CAPS: 75.0000 mg | ORAL_CAPSULE | Freq: Two times a day (BID) | ORAL | 0 refills | Status: AC

## 2024-05-19 NOTE — Telephone Encounter (Signed)
 Patient seen in clinic 05/19/24 see office note

## 2024-05-19 NOTE — Progress Notes (Signed)
 Established Patient Office Visit  Subjective   Patient ID: Kara [name], female    DOB: 1971/04/13  Age: 53 y.o. MRN: [id number]  Chief Complaint  Patient presents with   Cough    Since halloween; son has flu this week lives with her    53y/o divorced caucasian female established patient reported teenage son tested positive for flu would like tamiflu  Rx as cough worsening again.  Denied fever but sometimes feeling hot.  Never took augmentin  for otitis as ear pain resolved before she picked up from pharmacy.  Using bromphed DM 5ml po prn cough/rhinitis.  Has not needed albuterol  inhaler but has at home.  Had covid test when this started around Halloween and results negative.  Cough and congestion has improved and worsened but never completely resolved.  Started wearing surgical mask at work and when around others after son diagnosed with flu since he was her close contact  Received influenza vaccine 04/28/24      Review of Systems  Constitutional:  Positive for malaise/fatigue. Negative for chills and fever.  HENT:  Positive for congestion. Negative for ear discharge, ear pain, nosebleeds, sinus pain, sore throat and tinnitus.   Eyes:  Negative for pain, discharge and redness.  Respiratory:  Positive for cough and sputum production. Negative for hemoptysis, shortness of breath, wheezing and stridor.   Cardiovascular:  Negative for chest pain.  Gastrointestinal:  Negative for diarrhea, nausea and vomiting.  Musculoskeletal:  Negative for back pain, myalgias and neck pain.  Skin:  Negative for itching and rash.  Neurological:  Negative for dizziness, tingling, tremors, speech change, focal weakness, seizures, loss of consciousness, weakness and headaches.  Endo/Heme/Allergies:  Positive for environmental allergies.  Psychiatric/Behavioral:  The patient does not have insomnia.       Objective:     Pulse 64   Temp 98.9 F (37.2 C) (Tympanic)   Resp 16   SpO2 98%    Physical  Exam Vitals reviewed.  Constitutional:      General: She is awake. She is not in acute distress.    Appearance: Normal appearance. She is well-developed, well-groomed and normal weight. She is not ill-appearing, toxic-appearing or diaphoretic.  HENT:     Head: Normocephalic and atraumatic. No right periorbital erythema or left periorbital erythema.     Jaw: There is normal jaw occlusion.     Salivary Glands: Right salivary gland is not diffusely enlarged or tender. Left salivary gland is not diffusely enlarged or tender.     Right Ear: Hearing and external ear normal. No decreased hearing noted. No laceration, drainage, swelling or tenderness. A middle ear effusion is present. There is no impacted cerumen. No foreign body. No mastoid tenderness. No PE tube. No hemotympanum. Tympanic membrane is not injected, scarred, perforated, erythematous, retracted or bulging.     Left Ear: Hearing and external ear normal. No decreased hearing noted. No laceration, drainage, swelling or tenderness. A middle ear effusion is present. There is no impacted cerumen. No foreign body. No mastoid tenderness. No PE tube. No hemotympanum. Tympanic membrane is not injected, scarred, perforated, erythematous, retracted or bulging.     Ears:     Comments: Bilateral TMs intact air fluid level clear; no debris in auditory canals; bilateral allergic shiners; clear discharge bilateral nasal turbinates; cobblestoning posterior pharynx; nasal sniffing observed in clinic    Nose: Mucosal edema, congestion and rhinorrhea present. No laceration. Rhinorrhea is clear.     Right Nostril: No epistaxis.  Left Nostril: No epistaxis.     Right Turbinates: Enlarged and swollen. Not pale.     Left Turbinates: Enlarged and swollen. Not pale.     Right Sinus: No maxillary sinus tenderness or frontal sinus tenderness.     Left Sinus: No maxillary sinus tenderness or frontal sinus tenderness.     Mouth/Throat:     Lips: Pink. No lesions.      Mouth: Mucous membranes are moist. No oral lesions or angioedema.     Dentition: No gum lesions.     Tongue: No lesions. Tongue does not deviate from midline.     Palate: No mass and lesions.     Pharynx: Uvula midline. Pharyngeal swelling and postnasal drip present. No oropharyngeal exudate, posterior oropharyngeal erythema or uvula swelling.     Tonsils: No tonsillar exudate.  Eyes:     General: Lids are normal. Vision grossly intact. Gaze aligned appropriately. Allergic shiner present. No scleral icterus.       Right eye: No discharge.        Left eye: No discharge.     Extraocular Movements: Extraocular movements intact.     Conjunctiva/sclera: Conjunctivae normal.     Pupils: Pupils are equal, round, and reactive to light.  Neck:     Trachea: Trachea and phonation normal.  Cardiovascular:     Rate and Rhythm: Normal rate and regular rhythm.     Pulses: Normal pulses.          Radial pulses are 2+ on the right side and 2+ on the left side.     Heart sounds: Normal heart sounds, S1 normal and S2 normal.  Pulmonary:     Effort: Pulmonary effort is normal. No respiratory distress.     Breath sounds: Normal breath sounds and air entry. No stridor or transmitted upper airway sounds. No decreased breath sounds, wheezing, rhonchi or rales.     Comments: Spoke full sentences without difficulty; no cough observed in exam room; BBS CTA; patient wearing surgical mask on presentation to clinic Abdominal:     General: Abdomen is flat.  Musculoskeletal:        General: Normal range of motion.     Right hand: Normal strength. Normal capillary refill.     Left hand: Normal strength. Normal capillary refill.     Cervical back: Normal range of motion and neck supple. No swelling, edema, deformity, erythema, signs of trauma, lacerations, rigidity, spasms, torticollis, tenderness or crepitus. No pain with movement or muscular tenderness. Normal range of motion.     Thoracic back: No swelling,  edema, deformity, signs of trauma, lacerations, spasms or tenderness. Normal range of motion.     Right lower leg: No edema.     Left lower leg: No edema.  Lymphadenopathy:     Head:     Right side of head: No submental, submandibular, tonsillar, preauricular, posterior auricular or occipital adenopathy.     Left side of head: No submental, submandibular, tonsillar, preauricular, posterior auricular or occipital adenopathy.     Cervical: No cervical adenopathy.     Right cervical: No superficial, deep or posterior cervical adenopathy.    Left cervical: No superficial, deep or posterior cervical adenopathy.  Skin:    General: Skin is warm and dry.     Capillary Refill: Capillary refill takes less than 2 seconds.     Coloration: Skin is not ashen, cyanotic, jaundiced, mottled, pale or sallow.     Findings: No abrasion, abscess, acne, bruising, burn,  ecchymosis, erythema, signs of injury, laceration, lesion, petechiae, rash or wound.     Nails: There is no clubbing.     Comments: Anterior face/neck/hands visually inspected  Neurological:     General: No focal deficit present.     Mental Status: She is alert and oriented to person, place, and time. Mental status is at baseline.     Cranial Nerves: No cranial nerve deficit.     Motor: Motor function is intact. No weakness, tremor, abnormal muscle tone or seizure activity.     Coordination: Coordination is intact. Coordination normal.     Gait: Gait is intact. Gait normal.     Comments: In/out of chair without difficulty; gait sure and steady in clinic; bilateral hand grasp equal 5/5  Psychiatric:        Attention and Perception: Attention and perception normal.        Mood and Affect: Mood and affect normal.        Speech: Speech normal.        Behavior: Behavior normal. Behavior is cooperative.        Thought Content: Thought content normal.        Cognition and Memory: Cognition and memory normal.        Judgment: Judgment normal.       No results found for any visits on 05/19/24.    The 10-year ASCVD risk score (Arnett DK, et al., 2019) is: 3%    Assessment & Plan:   Problem List Items Addressed This Visit       Other   Acute cough - Primary   Other Visit Diagnoses       Exposure to the flu         Electronic Rx tamiflu  75mg  po BID X 5 days #10 RF0 sent to her pharmacy of choice.  Continue bromphed dm 5ml po q6h prn cough/rhinitis  albuterol  inhaler 1-2 puffs po q4-6h prn protracted coughing/wheezing/chest tightness.  Discussed post nasal drip could have been allergies/different virus and now possibly flu with current worsening due to close contact.  Discussed flu typically develops fever, body aches, rhinitis, cough.  Discussed flu and RSV/hand foot/mouth viral illnesses on increase in local community.  May use flonase  1 spray each nostril BID received new Rx last appt, saline 2 sprays each nostril q2h wa prn congestion. Sinuses not TTP today cobblestoning posterior pharynx and mild audible nasal congestion/sniffing noted in clinic.  If no improvement with 48 hours of saline and flonase  and if chunky, opaque/tan mucous pick up Rx sent to pharmacy last month augmentin  875mg  po BID x 10 days #20 RF0  Denied personal or family history of ENT cancer.  Shower BID especially prior to bed. No evidence of systemic bacterial infection, non toxic and well hydrated.  I do not see where any further testing or imaging is necessary at this time.   I will suggest supportive care, rest, good hygiene and encourage the patient to take adequate fluids.  The patient is to return to clinic or EMERGENCY ROOM if symptoms worsen or change significantly.  Exitcare handout on influenza and cough  Patient verbalized agreement and understanding of treatment plan and had no further questions at this time.   P2:  Hand washing and cover cough   Return if symptoms worsen or fail to improve.    Ellouise DELENA Hope, NP

## 2024-05-19 NOTE — Patient Instructions (Signed)
 Influenza, Adult Influenza is also called the flu. It's an infection that affects your respiratory tract. This includes your nose, throat, windpipe, and lungs. The flu is contagious. This means it spreads easily from person to person. It causes symptoms that are like a cold. It can also cause a high fever and body aches. What are the causes? The flu is caused by the influenza virus. You can get it by: Breathing in droplets that are in the air after an infected person coughs or sneezes. Touching something that has the virus on it and then touching your mouth, nose, or eyes. What increases the risk? You may be more likely to get the flu if: You don't wash your hands often. You're near a lot of people during cold and flu season. You touch your mouth, eyes, or nose without washing your hands first. You don't get a flu shot each year. You may also be more at risk for the flu and serious problems, such as a lung infection called pneumonia, if: You're older than 65. You're pregnant. Your immune system is weak. Your immune system is your body's defense system. You have a long-term, or chronic, condition, such as: Heart, kidney, or lung disease. Diabetes. A liver disorder. Asthma. You're very overweight. You have anemia. This is when you don't have enough red blood cells in your body. What are the signs or symptoms? Flu symptoms often start all of a sudden. They may last 4-14 days and include: Fever and chills. Headaches, body aches, or muscle aches. Sore throat. Cough. Runny or stuffy nose. Discomfort in your chest. Not wanting to eat as much as normal. Feeling weak or tired. Feeling dizzy. Nausea or vomiting. How is this diagnosed? The flu may be diagnosed based on your symptoms and medical history. You may also have a physical exam. A swab may be taken from your nose or throat and tested for the virus. How is this treated? If the flu is found early, you can be treated with antiviral  medicine. This may be given to you by mouth or through an IV. It can help you feel less sick and get better faster. Taking care of yourself at home can also help your symptoms get better. Your health care provider may tell you to: Take over-the-counter medicines. Drink lots of fluids. The flu often goes away on its own. If you have very bad symptoms or problems caused by the flu, you may need to be treated in a hospital. Follow these instructions at home: Activity Rest as needed. Get lots of sleep. Stay home from work or school as told by your provider. Leave home only to go see your provider. Do not leave home for other reasons until you don't have a fever for 24 hours without taking medicine. Eating and drinking Take an oral rehydration solution (ORS). This is a drink that is sold at pharmacies and stores. Drink enough fluid to keep your pee pale yellow. Try to drink small amounts of clear fluids. These include water, ice chips, fruit juice mixed with water, and low-calorie sports drinks. Try to eat bland foods that are easy to digest. These include bananas, applesauce, rice, lean meats, toast, and crackers. Avoid drinks that have a lot of sugar or caffeine in them. These include energy drinks, regular sports drinks, and soda. Do not drink alcohol. Do not eat spicy or fatty foods. General instructions     Take your medicines only as told by your provider. Use a cool mist humidifier  to add moisture to the air in your home. This can make it easier for you to breathe. You should also clean the humidifier every day. To do so: Empty the water. Pour clean water in. Cover your mouth and nose when you cough or sneeze. Wash your hands with soap and water often and for at least 20 seconds. It's extra important to do so after you cough or sneeze. If you can't use soap and water, use hand sanitizer. How is this prevented?  Get a flu shot every year. Ask your provider when you should get your flu  shot. Stay away from people who are sick during fall and winter. Fall and winter are cold and flu season. Contact a health care provider if: You get new symptoms. You have chest pain. You have watery poop, also called diarrhea. You have a fever. Your cough gets worse. You start to have more mucus. You feel like you may vomit, or you vomit. Get help right away if: You become short of breath or have trouble breathing. Your skin or nails turn blue. You have very bad pain or stiffness in your neck. You get a sudden headache or pain in your face or ear. You vomit each time you eat or drink. These symptoms may be an emergency. Call 911 right away. Do not wait to see if the symptoms will go away. Do not drive yourself to the hospital. This information is not intended to replace advice given to you by your health care provider. Make sure you discuss any questions you have with your health care provider. Document Revised: 02/28/2023 Document Reviewed: 07/05/2022 Elsevier Patient Education  2024 [employer]Cough, Adult Coughing is a reflex that clears your throat and airways (respiratory system). It helps heal and protect your lungs. It is normal to cough from time to time. A cough that happens with other symptoms or that lasts a long time may be a sign of a condition that needs treatment. A short-term (acute) cough may only last 2-3 weeks. A long-term (chronic) cough may last 8 or more weeks. Coughing is often caused by: Diseases, such as: An infection of the respiratory system. Asthma or other heart or lung diseases. Gastroesophageal reflux. This is when acid comes back up from the stomach. Breathing in things that irritate your lungs. Allergies. Postnasal drip. This is when mucus runs down the back of your throat. Smoking. Some medicines. Follow these instructions at home: Medicines Take over-the-counter and prescription medicines only as told by your health care provider. Talk with  your provider before you take cough medicine (cough suppressants). Eating and drinking Do not drink alcohol. Avoid caffeine. Drink enough fluid to keep your pee (urine) pale yellow. Lifestyle Avoid cigarette smoke. Do not use any products that contain nicotine or tobacco. These products include cigarettes, chewing tobacco, and vaping devices, such as e-cigarettes. If you need help quitting, ask your provider. Avoid things that make you cough. These may include perfumes, candles, cleaning products, or campfire smoke. General instructions  Watch for any changes to your cough. Tell your provider about them. Always cover your mouth when you cough. If the air is dry in your bedroom or home, use a cool mist vaporizer or humidifier. If your cough is worse at night, try to sleep in a semi-upright position. Rest as needed. Contact a health care provider if: You have new symptoms, or your symptoms get worse. You cough up pus. You have a fever that does not go away or a  cough that does not get better after 2-3 weeks. You cannot control your cough with medicine, and you are losing sleep. You have pain that gets worse or is not helped with medicine. You lose weight for no clear reason. You have night sweats. Get help right away if: You cough up blood. You have trouble breathing. Your heart is beating very fast. These symptoms may be an emergency. Get help right away. Call 911. Do not wait to see if the symptoms will go away. Do not drive yourself to the hospital. This information is not intended to replace advice given to you by your health care provider. Make sure you discuss any questions you have with your health care provider. Document Revised: 01/26/2022 Document Reviewed: 01/26/2022 Elsevier Patient Education  2024 [employer].

## 2024-05-22 ENCOUNTER — Telehealth: Payer: Self-pay | Admitting: Registered Nurse

## 2024-05-22 ENCOUNTER — Encounter: Payer: Self-pay | Admitting: Registered Nurse

## 2024-05-22 DIAGNOSIS — J101 Influenza due to other identified influenza virus with other respiratory manifestations: Secondary | ICD-10-CM

## 2024-05-22 NOTE — Telephone Encounter (Signed)
 Patient reported positive flu B test last night and stayed home from work today.  HR and supervisor notified excused absence today.  Patient to continue plan of care as previously discussed and notify me if any new or worsening symptoms.  Exitcare handout on influenza.  Patient agreed with plan of care and had no further questions at this time.

## 2024-05-24 NOTE — Telephone Encounter (Signed)
 Patient reported took last dose of tamiflu  earlier today.  Feeling better today than previous days.  Still not 100% but feels well enough to work tomorrow.  Denied fever/chills/n/v/d in past 48 hours.  Discussed with patient if new or worsening symptoms in am stay home, contact NP and use normal call out procedures.  Patient A&Ox3 spoke full sentences without difficulty no cough/throat clearing audible during 2 minute call.  Mild nasal congestion audible.  Patient notified to wear mask if cough/sneezing still.  Frequent handwashing as could still be contagious to others with virus in mucous.  HR notified patient cleared to be onsite tomorrow with mask.  Patient agreed with plan of care and had no further questions at this time.

## 2024-05-25 ENCOUNTER — Telehealth: Payer: Self-pay | Admitting: General Practice

## 2024-05-25 NOTE — Telephone Encounter (Signed)
 Left generic message on voicemail stating Kara [name]'s name. Inquired if she had returned back to work today, requested a callback to the clinic or email the clinic.

## 2024-05-26 NOTE — Telephone Encounter (Signed)
 Patient seen in workcenter wearing surgical mask.  No coughing observed BBS CTA sp02 97% RA HR 98 discussed hydrate with water.  Patient reported still fatigued more than usual and going to bed earlier 9pm this week.  Discussed her body is still recovering from viral illness and will need more time to fully recover but lungs clear and oxygen level good.  Son doing better also but still coughing.  Patient denied further questions or concerns  Only using OTC cough medication at this time.  Follow up if new or worsening symptoms for re-evaluation patient agreed with plan of care and had no further questions at this time.  Patient returned to work yesterday as expected.  Gait sure and steady spoke full sentences without difficulty skin warm dry and pink
